# Patient Record
Sex: Female | Born: 1935 | Race: White | Hispanic: No | Smoking: Never smoker
Health system: Southern US, Community
[De-identification: ages and names within clinical notes are randomized; demographics above are authoritative.]

## PROBLEM LIST (undated history)

## (undated) DIAGNOSIS — N390 Urinary tract infection, site not specified: Secondary | ICD-10-CM

## (undated) DIAGNOSIS — Z8719 Personal history of other diseases of the digestive system: Secondary | ICD-10-CM

## (undated) DIAGNOSIS — N183 Chronic kidney disease, stage 3 unspecified: Secondary | ICD-10-CM

## (undated) DIAGNOSIS — D649 Anemia, unspecified: Secondary | ICD-10-CM

## (undated) DIAGNOSIS — Z8701 Personal history of pneumonia (recurrent): Secondary | ICD-10-CM

## (undated) DIAGNOSIS — K219 Gastro-esophageal reflux disease without esophagitis: Secondary | ICD-10-CM

## (undated) DIAGNOSIS — I2510 Atherosclerotic heart disease of native coronary artery without angina pectoris: Secondary | ICD-10-CM

## (undated) DIAGNOSIS — I890 Lymphedema, not elsewhere classified: Secondary | ICD-10-CM

## (undated) DIAGNOSIS — Z8711 Personal history of peptic ulcer disease: Secondary | ICD-10-CM

## (undated) DIAGNOSIS — E039 Hypothyroidism, unspecified: Secondary | ICD-10-CM

## (undated) DIAGNOSIS — E785 Hyperlipidemia, unspecified: Secondary | ICD-10-CM

## (undated) DIAGNOSIS — I10 Essential (primary) hypertension: Secondary | ICD-10-CM

## (undated) DIAGNOSIS — M1990 Unspecified osteoarthritis, unspecified site: Secondary | ICD-10-CM

## (undated) HISTORY — DX: Chronic kidney disease, stage 3 unspecified: N18.30

## (undated) HISTORY — DX: Chronic kidney disease, stage 3 (moderate): N18.3

## (undated) HISTORY — DX: Urinary tract infection, site not specified: N39.0

## (undated) HISTORY — DX: Hyperlipidemia, unspecified: E78.5

## (undated) HISTORY — PX: ABDOMINAL HYSTERECTOMY: SUR658

## (undated) HISTORY — PX: CORONARY ARTERY BYPASS GRAFT: SHX141

## (undated) HISTORY — DX: Essential (primary) hypertension: I10

## (undated) HISTORY — DX: Personal history of pneumonia (recurrent): Z87.01

## (undated) HISTORY — DX: Lymphedema, not elsewhere classified: I89.0

## (undated) HISTORY — DX: Personal history of other diseases of the digestive system: Z87.19

## (undated) HISTORY — DX: Unspecified osteoarthritis, unspecified site: M19.90

## (undated) HISTORY — DX: Atherosclerotic heart disease of native coronary artery without angina pectoris: I25.10

## (undated) HISTORY — DX: Anemia, unspecified: D64.9

## (undated) HISTORY — DX: Hypothyroidism, unspecified: E03.9

## (undated) HISTORY — DX: Gastro-esophageal reflux disease without esophagitis: K21.9

## (undated) HISTORY — DX: Personal history of peptic ulcer disease: Z87.11

---

## 2002-02-07 ENCOUNTER — Encounter: Payer: Self-pay | Admitting: Cardiology

## 2002-02-08 ENCOUNTER — Encounter: Payer: Self-pay | Admitting: Cardiothoracic Surgery

## 2002-02-08 ENCOUNTER — Inpatient Hospital Stay (HOSPITAL_COMMUNITY): Admission: AD | Admit: 2002-02-08 | Discharge: 2002-02-13 | Payer: Self-pay | Admitting: Cardiology

## 2002-02-09 ENCOUNTER — Encounter: Payer: Self-pay | Admitting: Cardiothoracic Surgery

## 2002-02-10 ENCOUNTER — Encounter: Payer: Self-pay | Admitting: Cardiothoracic Surgery

## 2002-02-11 ENCOUNTER — Encounter: Payer: Self-pay | Admitting: Thoracic Surgery (Cardiothoracic Vascular Surgery)

## 2004-02-12 ENCOUNTER — Ambulatory Visit: Payer: Self-pay

## 2004-02-13 ENCOUNTER — Ambulatory Visit: Payer: Self-pay

## 2004-02-14 ENCOUNTER — Ambulatory Visit: Payer: Self-pay

## 2004-02-15 ENCOUNTER — Ambulatory Visit: Payer: Self-pay

## 2004-03-03 ENCOUNTER — Ambulatory Visit: Payer: Self-pay | Admitting: Cardiology

## 2004-03-14 ENCOUNTER — Ambulatory Visit: Payer: Self-pay | Admitting: Cardiovascular Disease

## 2004-04-10 ENCOUNTER — Ambulatory Visit: Payer: Self-pay | Admitting: Internal Medicine

## 2004-05-29 ENCOUNTER — Ambulatory Visit: Payer: Self-pay | Admitting: Cardiology

## 2005-06-16 ENCOUNTER — Ambulatory Visit: Payer: Self-pay | Admitting: Cardiology

## 2005-08-28 ENCOUNTER — Ambulatory Visit: Payer: Self-pay | Admitting: Unknown Physician Specialty

## 2006-07-24 ENCOUNTER — Inpatient Hospital Stay: Payer: Self-pay | Admitting: Internal Medicine

## 2006-07-24 ENCOUNTER — Other Ambulatory Visit: Payer: Self-pay

## 2006-12-15 ENCOUNTER — Ambulatory Visit: Payer: Self-pay | Admitting: Cardiology

## 2007-04-25 ENCOUNTER — Ambulatory Visit: Payer: Self-pay | Admitting: Internal Medicine

## 2007-05-21 ENCOUNTER — Other Ambulatory Visit: Payer: Self-pay

## 2007-05-22 ENCOUNTER — Inpatient Hospital Stay: Payer: Self-pay | Admitting: Specialist

## 2007-12-20 ENCOUNTER — Ambulatory Visit: Payer: Self-pay | Admitting: Cardiology

## 2008-04-18 ENCOUNTER — Ambulatory Visit: Payer: Self-pay | Admitting: Internal Medicine

## 2008-05-09 ENCOUNTER — Telehealth (INDEPENDENT_AMBULATORY_CARE_PROVIDER_SITE_OTHER): Payer: Self-pay | Admitting: *Deleted

## 2008-05-10 ENCOUNTER — Ambulatory Visit: Payer: Self-pay | Admitting: Cardiology

## 2008-05-22 ENCOUNTER — Ambulatory Visit: Payer: Self-pay | Admitting: Cardiology

## 2008-05-23 ENCOUNTER — Ambulatory Visit: Payer: Self-pay | Admitting: Cardiology

## 2008-05-23 ENCOUNTER — Ambulatory Visit: Payer: Self-pay | Admitting: Cardiovascular Disease

## 2008-06-12 ENCOUNTER — Ambulatory Visit: Payer: Self-pay | Admitting: Cardiology

## 2008-07-17 DIAGNOSIS — R0602 Shortness of breath: Secondary | ICD-10-CM

## 2008-07-17 DIAGNOSIS — E785 Hyperlipidemia, unspecified: Secondary | ICD-10-CM | POA: Insufficient documentation

## 2008-07-17 DIAGNOSIS — E039 Hypothyroidism, unspecified: Secondary | ICD-10-CM | POA: Insufficient documentation

## 2008-07-17 DIAGNOSIS — I10 Essential (primary) hypertension: Secondary | ICD-10-CM | POA: Insufficient documentation

## 2008-07-17 DIAGNOSIS — R079 Chest pain, unspecified: Secondary | ICD-10-CM

## 2008-07-17 DIAGNOSIS — K219 Gastro-esophageal reflux disease without esophagitis: Secondary | ICD-10-CM

## 2008-07-25 ENCOUNTER — Telehealth: Payer: Self-pay | Admitting: Cardiology

## 2008-08-30 ENCOUNTER — Telehealth: Payer: Self-pay | Admitting: Cardiology

## 2008-09-03 ENCOUNTER — Emergency Department (HOSPITAL_COMMUNITY): Admission: EM | Admit: 2008-09-03 | Discharge: 2008-09-03 | Payer: Self-pay | Admitting: Emergency Medicine

## 2009-02-22 ENCOUNTER — Ambulatory Visit: Payer: Self-pay | Admitting: Unknown Physician Specialty

## 2009-06-21 ENCOUNTER — Ambulatory Visit: Payer: Self-pay | Admitting: Cardiovascular Disease

## 2009-06-21 DIAGNOSIS — I25810 Atherosclerosis of coronary artery bypass graft(s) without angina pectoris: Secondary | ICD-10-CM

## 2009-11-14 ENCOUNTER — Inpatient Hospital Stay: Payer: Self-pay | Admitting: Internal Medicine

## 2009-12-16 ENCOUNTER — Encounter: Payer: Self-pay | Admitting: Cardiovascular Disease

## 2010-01-07 ENCOUNTER — Ambulatory Visit: Payer: Self-pay | Admitting: Cardiovascular Disease

## 2010-03-03 ENCOUNTER — Telehealth: Payer: Self-pay | Admitting: Cardiovascular Disease

## 2010-03-11 NOTE — Assessment & Plan Note (Signed)
Summary: Z6X   Primary Provider:  Dr. [name]  CC:  ROV; No complaints.  History of Present Illness: very pleasant 77 year old woman with a history of bypass surgery in 2003, stress test April 2010 showing inferior wall ischemia with cardiac catheterization at that time showing occlusion of her native RCA and occlusion of her vein graft to the PDA with collateralized vessels from left to right, mild carotid arterial disease, moderate to severe MR by echocardiogram at her normal clinic in March 2010 who presents for routine followup.  Overall she states that she is doing very well. She denies any significant shortness of breath, chest pain, lower extremity edema. She drinks plenty of water, mows the yard and is very active each day. She has no complaints.  Problems Prior to Update: 1)  Chest Pain-unspecified  (ICD-786.50) 2)  Dyspnea  (ICD-786.05) 3)  Hypertension, Unspecified  (ICD-401.9) 4)  Hyperlipidemia-mixed  (ICD-272.4) 5)  Hypothyroidism  (ICD-244.9) 6)  Gerd  (ICD-530.81)  Medications Prior to Update: 1)  Nitroglycerin 0.4 Mg Subl (Nitroglycerin) .... One Tablet Under Tongue Every 5 Minutes As Needed For Chest Pain---May Repeat Times Three 2)  Furosemide 40 Mg Tabs (Furosemide) .... Take One Tablet By Mouth Daily.  Current Medications (verified): 1)  Nitroglycerin 0.4 Mg Subl (Nitroglycerin) .... One Tablet Under Tongue Every 5 Minutes As Needed For Chest Pain---May Repeat Times Three 2)  Levothroid 75 Mcg Tabs (Levothyroxine Sodium) .... Take 1 By Mouth Once Daily 3)  Simvastatin 40 Mg Tabs (Simvastatin) .... Take One Tablet By Mouth Daily At Bedtime 4)  Premarin 25 Mg Solr (Estrogens Conjugated) .... Take 1 By Mouth Every 5 Days 5)  Losartan Potassium 100 Mg Tabs (Losartan Potassium) .... Take 1/2 Tablet By Mouth Once Daily  (Not Started Yet) 6)  Benicar 40 Mg Tabs (Olmesartan Medoxomil) .... Take One Half  Tablet By Mouth Daily 7)  Aspirin 81 Mg Tbec (Aspirin) .... Take One  Tablet By Mouth Daily 8)  Oscal 500/200 D-3 500-200 Mg-Unit Tabs (Calcium-Vitamin D) .... Take 1 By Mouth Once Daily 9)  Multivitamins  Tabs (Multiple Vitamin) .... Take 1p O Once Daily  Allergies (verified): 1)  ! Iodine  Past History:  Past Medical History: Last updated: 07/17/2008 CHEST PAIN-UNSPECIFIED (ICD-786.50) DYSPNEA (ICD-786.05) HYPERTENSION, UNSPECIFIED (ICD-401.9) HYPERLIPIDEMIA-MIXED (ICD-272.4) HYPOTHYROIDISM (ICD-244.9) GERD (ICD-530.81)    Past Surgical History: Last updated: 07/17/2008 Cholecystectomy Hysterectomy CABG  Family History: Last updated: 07/17/2008 CHF Family History of Cancer:  Family History of Diabetes:   Social History: Last updated: 07/17/2008 Married  Tobacco Use - No.  Alcohol Use - no Drug Use - no  Risk Factors: Smoking Status: never (07/17/2008)  Review of Systems  The patient denies fever, weight loss, weight gain, vision loss, decreased hearing, hoarseness, chest pain, syncope, dyspnea on exertion, peripheral edema, prolonged cough, abdominal pain, incontinence, muscle weakness, depression, and enlarged lymph nodes.    Vital Signs:  Patient profile:   77 year old female Height:      63 inches Weight:      131 pounds BMI:     23.29 Pulse rate:   60 / minute BP sitting:   126 / 62  (left arm) Cuff size:   regular  Vitals Entered By: Stanton Kidney, EMT-P (Jun 21, 2009 10:52 AM)  Physical Exam  General:  well-appearing elderly woman in no apparent distress, HEENT exam is benign, oropharynx is clear, neck is supple with no JVP or carotid bruits, heart sounds are regular with S1-S2 with a 2/6  systolic ejection murmur heard at the apical region extending into the axillary area, lungs are clear to auscultation with no wheezes Rales, abdominal exam is benign, no significant lower extremity edema, neurologic exam is grossly nonfocal, skin is warm and dry. Pulses are equal and symmetrical in her upper and lower  extremities.    EKG  Procedure date:  06/21/2009  Findings:      normal sinus rhythm with a rate of 60 beats per minute, no significant ST or T wave changes.  Impression & Recommendations:  Problem # 1:  CAD, ARTERY BYPASS GRAFT (ICD-414.04) history of coronary artery disease with bypass in 03, occluded native RCA and vein graft to the RCA with collateral filling from left to right. She has no symptoms.  We will continue her on her current medications.  Her updated medication list for this problem includes:    Nitroglycerin 0.4 Mg Subl (Nitroglycerin) ..... One tablet under tongue every 5 minutes as needed for chest pain---may repeat times three    Aspirin 81 Mg Tbec (Aspirin) .Marland Kitchen... Take one tablet by mouth daily  Problem # 2:  HYPERLIPIDEMIA-MIXED (ICD-272.4) Try to obtain a copy of her most recent cholesterol panel from Dr. Aletha Halim when she sees him next week.  Her updated medication list for this problem includes:    Simvastatin 40 Mg Tabs (Simvastatin) .Marland Kitchen... Take one tablet by mouth daily at bedtime  Problem # 3:  HYPERTENSION, UNSPECIFIED (ICD-401.9) Blood pressure is well controlled on her current medication. She do to run out of Benicar . She is concerned about taking losartan 100 mg at a time and I suggested that she talk with Dr. [name] and that she could possibly cut it in half to start.  The following medications were removed from the medication list:    Furosemide 40 Mg Tabs (Furosemide) .Marland Kitchen... Take one tablet by mouth daily. Her updated medication list for this problem includes:    Losartan Potassium 100 Mg Tabs (Losartan potassium) .Marland Kitchen... Take 1/2 tablet by mouth once daily  (not started yet)    Benicar 40 Mg Tabs (Olmesartan medoxomil) .Marland Kitchen... Take one half  tablet by mouth daily    Aspirin 81 Mg Tbec (Aspirin) .Marland Kitchen... Take one tablet by mouth daily

## 2010-03-13 NOTE — Progress Notes (Signed)
Summary: Samples  Phone Note Call from Patient Call back at Home Phone [phone]   Caller: Self Call For: Gollan Summary of Call: Pt was given samples of Zetia and would like more to last until March 16. Initial call taken by: Harlon Flor,  March 03, 2010 9:35 AM  Follow-up for Phone Call        Patient will stop simvastatin and zetia and a Rx will be called [facility] [address] for generic lipitor 40 mg with titration up to 80 mg one tablet at bedtime.  Rx called to [facility] [address].   Follow-up by: Bishop Dublin, CMA,  March 03, 2010 9:50 AM    Prescriptions: LIPITOR 80 MG TABS (ATORVASTATIN CALCIUM) Take 1/2 tablet by mouth once daily for one month. Then take 1 tab by mouth once daily.  #30 x 6   Entered by:   Bishop Dublin, CMA   Authorized by:   Dossie Arbour MD   Signed by:   Bishop Dublin, CMA on 03/03/2010   Method used:   [facility]        [facility] [employer] [phone][facility] (retail)       [address] [place], [place]  [address]       Ph: [phone] or [phone]       Fax: [phone]   RxID:   [id number]

## 2010-03-13 NOTE — Assessment & Plan Note (Signed)
Summary: F6M/AMD   Visit Type:  Follow-up Primary Raeden Schippers:  Dr. [name]  CC:  Denies chest pain or shortness of breath..  History of Present Illness: 77 year old woman with a history of bypass surgery in 2003, stress test April 2010 showing inferior wall ischemia with cardiac catheterization at that time showing occlusion of her native RCA and occlusion of her vein graft to the PDA with collateralized vessels from left to right, mild carotid arterial disease, moderate to severe MR by echocardiogram at outside clinic in March 2010 who presents for routine followup.  She states that she was recently in the hospital for diverticular disease for 5 days. She had significant GI bleeding. She has been recovering well and slowly getting her strength back. She is otherwise very active, has no complaints of chest pain or shortness of breath. No lightheadedness or dizziness. Her energy level is much better.  EKG shows normal sinus rhythm with a rate of 55 beats per minute, no significant ST or T wave changes  Current Medications (verified): 1)  Nitroglycerin 0.4 Mg Subl (Nitroglycerin) .... One Tablet Under Tongue Every 5 Minutes As Needed For Chest Pain---May Repeat Times Three 2)  Levothroid 75 Mcg Tabs (Levothyroxine Sodium) .... Take 1 By Mouth Once Daily 3)  Simvastatin 40 Mg Tabs (Simvastatin) .... Take One Tablet By Mouth Daily At Bedtime 4)  Premarin 0.625 Mg Tabs (Estrogens Conjugated) .... One Tablet Once Daily 5)  Losartan Potassium 100 Mg Tabs (Losartan Potassium) .... Take 1/2 Tablet By Mouth Once Daily 6)  Benicar 40 Mg Tabs (Olmesartan Medoxomil) .... Take One Half  Tablet By Mouth Daily 7)  Aspirin 81 Mg Tbec (Aspirin) .... Take One Tablet By Mouth Daily 8)  Oscal 500/200 D-3 500-200 Mg-Unit Tabs (Calcium-Vitamin D) .... Take 1 By Mouth Once Daily 9)  Multivitamins  Tabs (Multiple Vitamin) .... Take 1p O Once Daily 10)  Metoprolol Succinate 100 Mg Xr24h-Tab (Metoprolol Succinate) ....  One Tablet Once Daily  Allergies (verified): 1)  ! Iodine  Past History:  Past Medical History: Last updated: 07/17/2008 CHEST PAIN-UNSPECIFIED (ICD-786.50) DYSPNEA (ICD-786.05) HYPERTENSION, UNSPECIFIED (ICD-401.9) HYPERLIPIDEMIA-MIXED (ICD-272.4) HYPOTHYROIDISM (ICD-244.9) GERD (ICD-530.81)    Past Surgical History: Last updated: 07/17/2008 Cholecystectomy Hysterectomy CABG  Family History: Last updated: 07/17/2008 CHF Family History of Cancer:  Family History of Diabetes:   Social History: Last updated: 07/17/2008 Married  Tobacco Use - No.  Alcohol Use - no Drug Use - no  Risk Factors: Smoking Status: never (07/17/2008)  Review of Systems  The patient denies fever, weight loss, weight gain, vision loss, decreased hearing, hoarseness, chest pain, syncope, dyspnea on exertion, peripheral edema, prolonged cough, abdominal pain, incontinence, muscle weakness, depression, and enlarged lymph nodes.    Vital Signs:  Patient profile:   77 year old female Height:      63 inches Weight:      124.75 pounds BMI:     22.18 Pulse rate:   55 / minute BP sitting:   114 / 62  (left arm) Cuff size:   regular  Vitals Entered By: Bishop Dublin, CMA (January 07, 2010 10:28 AM)  Physical Exam  General:  Well developed, well nourished, in no acute distress. Head:  normocephalic and atraumatic Neck:  Neck supple, no JVD. No masses, thyromegaly or abnormal cervical nodes. Chest Wall:  no deformities or breast masses noted Lungs:  Clear bilaterally to auscultation and percussion. Heart:  Non-displaced PMI, chest non-tender; regular rate and rhythm, S1, S2 without murmurs, rubs or gallops. Carotid  upstroke normal, no bruit.  Pedals normal pulses. No edema, no varicosities. Abdomen:  Bowel sounds positive; abdomen soft and non-tender without masses Msk:  Back normal, normal gait. Muscle strength and tone normal. Pulses:  pulses normal in all 4 extremities Extremities:  No  clubbing or cyanosis. Neurologic:  Alert and oriented x 3. Skin:  Intact without lesions or rashes. Psych:  Normal affect.   Impression & Recommendations:  Problem # 1:  CAD, ARTERY BYPASS GRAFT (ICD-414.04) no symptoms of angina at this time. No further testing ordered.  Her updated medication list for this problem includes:    Nitroglycerin 0.4 Mg Subl (Nitroglycerin) ..... One tablet under tongue every 5 minutes as needed for chest pain---may repeat times three    Aspirin 81 Mg Tbec (Aspirin) .Marland Kitchen... Take one tablet by mouth daily    Metoprolol Succinate 100 Mg Xr24h-tab (Metoprolol succinate) ..... One tablet once daily  Problem # 2:  HYPERTENSION, UNSPECIFIED (ICD-401.9) Blood pressure is well controlled on her current medication regimen. No changes made.  The following medications were removed from the medication list:    Benicar 40 Mg Tabs (Olmesartan medoxomil) .Marland Kitchen... Take one half  tablet by mouth daily Her updated medication list for this problem includes:    Losartan Potassium 100 Mg Tabs (Losartan potassium) .Marland Kitchen... Take 1/2 tablet by mouth once daily    Aspirin 81 Mg Tbec (Aspirin) .Marland Kitchen... Take one tablet by mouth daily    Metoprolol Succinate 100 Mg Xr24h-tab (Metoprolol succinate) ..... One tablet once daily  Problem # 3:  HYPERLIPIDEMIA-MIXED (ICD-272.4) we will try to obtain her most recent lipid panel from Dr. [name]. Goal LDL is less than 70  Her updated medication list for this problem includes:    Simvastatin 40 Mg Tabs (Simvastatin) .Marland Kitchen... Take one tablet by mouth daily at bedtime  Patient Instructions: 1)  Your physician recommends that you schedule a follow-up appointment in: 6 months 2)  Your physician recommends that you continue on your current medications as directed. Please refer to the Current Medication list given to you today.  Appended Document: F6M/AMD Labs from November 2011 shows total cholesterol 188, LDL 92, HDL 71, normal LFTs. Suggest we change  her from simvastatin to Lipitor 40 mg daily for one month with titration up to 80 mg daily with lipid panel and LFTs in 3 months time.  Appended Document: F6M/AMD Pt notified of lab results, pt aware she is to stop simvastatin and start lipitor 40mg  1 month and then 80mg  once daily. Rx called in to [facility] [address], and scheduled for lipid/lfts 3 months.  Appended Document: F6M/AMD    Clinical Lists Changes  Medications: Changed medication from SIMVASTATIN 40 MG TABS (SIMVASTATIN) Take one tablet by mouth daily at bedtime to LIPITOR 80 MG TABS (ATORVASTATIN CALCIUM) Take 1/2 tablet by mouth once daily for one month. Then take 1 tab by mouth once daily. - Signed Rx of LIPITOR 80 MG TABS (ATORVASTATIN CALCIUM) Take 1/2 tablet by mouth once daily for one month. Then take 1 tab by mouth once daily.;  #30 x 6;  Signed;  Entered by: Lanny Hurst RN;  Authorized by: Dossie Arbour MD;  Method used: Electronically to [facility] [address], [place], [place], [place]  [address], Ph: [phone], Fax: [phone]    Prescriptions: LIPITOR 80 MG TABS (ATORVASTATIN CALCIUM) Take 1/2 tablet by mouth once daily for one month. Then take 1 tab by mouth once daily.  #30 x  6   Entered by:   Lanny Hurst RN   Authorized by:   Dossie Arbour MD   Signed by:   Lanny Hurst RN on 01/21/2010   Method used:   Electronically to        [facility]  [address] (retail)       [address], [address]       [place], [place]  [address]       Ph: [phone]       Fax: [phone]   RxID:   [id number]    Appended Document: F6M/AMD pt states she had just had zocor 3 months supply filled that came in the mail and she cannot return it. She would like to know if she can continue this for 3 months and then change to lipitor. Pt is still scheduled for 3 month lipid/lft.  Appended Document: F6M/AMD use the simvastatin, but we will add zetia for three  months. Stop zetia when changing to lipitor  We can wait to start zetia until we get samples (the take 10 mg and cut in 1/2 and take everyday with simva)  Appended Document: F6M/AMD Attempted to call pt LMOM TCB /MES  Appended Document: F6M/AMD pt made aware of above recommendation, will call pt when recieve zetia samples, pt will continue simvastatin. /MES  Appended Document: F6M/AMD Notified patient samples available for zetia 10 mg x 3 week supply.

## 2010-04-22 ENCOUNTER — Other Ambulatory Visit (INDEPENDENT_AMBULATORY_CARE_PROVIDER_SITE_OTHER): Payer: Medicare PPO

## 2010-04-22 ENCOUNTER — Encounter: Payer: Self-pay | Admitting: Cardiovascular Disease

## 2010-04-22 DIAGNOSIS — E785 Hyperlipidemia, unspecified: Secondary | ICD-10-CM

## 2010-04-24 LAB — CONVERTED CEMR LAB
ALT: 18 units/L (ref 0–35)
Alkaline Phosphatase: 40 units/L (ref 39–117)
Cholesterol: 194 mg/dL (ref 0–200)
Indirect Bilirubin: 0.5 mg/dL (ref 0.0–0.9)
Total Protein: 6 g/dL (ref 6.0–8.3)
Triglycerides: 126 mg/dL (ref <150)
VLDL: 25 mg/dL (ref 0–40)

## 2010-05-18 LAB — URINALYSIS, ROUTINE W REFLEX MICROSCOPIC
Bilirubin Urine: NEGATIVE
Ketones, ur: NEGATIVE mg/dL
Nitrite: NEGATIVE
Protein, ur: NEGATIVE mg/dL
Urobilinogen, UA: 0.2 mg/dL (ref 0.0–1.0)

## 2010-05-18 LAB — DIFFERENTIAL
Eosinophils Relative: 1 % (ref 0–5)
Lymphocytes Relative: 24 % (ref 12–46)
Monocytes Absolute: 0.7 10*3/uL (ref 0.1–1.0)
Monocytes Relative: 10 % (ref 3–12)
Neutro Abs: 4.5 10*3/uL (ref 1.7–7.7)

## 2010-05-18 LAB — CBC
MCHC: 34.6 g/dL (ref 30.0–36.0)
MCV: 95.5 fL (ref 78.0–100.0)
Platelets: 164 10*3/uL (ref 150–400)

## 2010-05-18 LAB — COMPREHENSIVE METABOLIC PANEL
AST: 27 U/L (ref 0–37)
Albumin: 3.2 g/dL — ABNORMAL LOW (ref 3.5–5.2)
Alkaline Phosphatase: 30 U/L — ABNORMAL LOW (ref 39–117)
BUN: 19 mg/dL (ref 6–23)
Chloride: 102 mEq/L (ref 96–112)
GFR calc Af Amer: 50 mL/min — ABNORMAL LOW (ref >60)
Potassium: 4.4 mEq/L (ref 3.5–5.1)
Total Protein: 5.7 g/dL — ABNORMAL LOW (ref 6.0–8.3)

## 2010-05-18 LAB — TSH: TSH: 0.947 u[IU]/mL (ref 0.350–4.500)

## 2010-05-18 LAB — POCT CARDIAC MARKERS
CKMB, poc: 1.2 ng/mL (ref 1.0–8.0)
Myoglobin, poc: 116 ng/mL (ref 12–200)

## 2010-05-18 LAB — T4, FREE: Free T4: 1.31 ng/dL (ref 0.80–1.80)

## 2010-06-24 NOTE — Assessment & Plan Note (Signed)
[facility] OFFICE NOTE   DAYANIRA, GIOVANNETTI                     MRN:          [id number]  DATE:12/15/2006                            DOB:          November 09, 1922    Ms. Stones comes today to establish with me as her cardiologist.  She  has been a former patient of Dr. Annette Stable Downey's who has moved to  [place].   She is 89-years of age and she literally works non-stop as she puts it.  She thoroughly enjoys her yard, her flowers and her garden. She will  work all day sometimes without stopping.   She has had coronary artery bypass surgery in 2003.  She had a negative  stress Myoview with normal left ventricular function in January of 2006.  She is having absolutely no symptoms of angina or dyspnea on exertion.  She presented with these symptoms prior to her diagnosis of coronary  disease.   She has hypertension and hyperlipidemia which is being managed by Dr.  [name]. She has been with him for 16 years apparently.   CURRENT MEDICATIONS:  1. Avapro 150 mg daily.  2. Premarin 0.625 mg daily.  3. Aspirin 81 mg daily.  4. Os-Cal and vitamins daily.  5. Toprol XL 50 mg daily.  6. Folic acid 1 mg daily.  7. Zocor 40 mg daily.  8. Prilosec 20 mg daily.  9. Levoxyl 100 mcg daily.   VITAL SIGNS:  Her blood pressure today is 112/62, her pulse is 68 and  regular.  EKG is normal.  Her weight is 131.  HEENT:  She looks extremely younger than her stated age. PERLA.  Extraocular movements are intact. Sclerae clear. Facial symmetry is  normal.  NECK:  Carotids are full without bruits, there is no JVD. Thyroid is not  enlarged. Trachea is midline.  MUSCULOSKELETAL:  Sternotomy site is stable.  There is a slight amount  of point tenderness just to the left parasternal border at the level of  the breast.  LUNGS:  Clear.  HEART:  Reveals a non displaced PMI, normal S1, S2.  ABDOMEN:  Soft with good bowel sounds.  EXTREMITIES:  No edema. Pulses are intact.  NEURO EXAM:  Intact.   ASSESSMENT AND PLAN:  Ms. Brogden is doing remarkably well.  I have not  made any changes in her medical program.  Will see her back in a year.  At that time will probably perform  a stress Myoview.  At this point she is extremely active and having no  symptoms at all, much less symptoms consistent with how she presented.     Thomas C. Daleen Squibb, MD, [facility]  Electronically Signed    TCW/MedQ  DD: 12/15/2006  DT: 12/15/2006  Job #: [id number]   cc:   Aram Beecham, M.D.

## 2010-06-24 NOTE — Assessment & Plan Note (Signed)
[facility] OFFICE NOTE   NETA, UPADHYAY                     MRN:          [id number]  DATE:05/22/2008                            DOB:          [birth year]    CHIEF COMPLAINT:  Chest tightness and pressure as well as shortness of  breath with exertion.  I do not feel well.   HISTORY OF PRESENT ILLNESS:  Ms. Radley Teston is an 77 year old much  younger looking than stated age white female who has known coronary  artery disease.  She has had coronary bypass grafting in 2003, by Dr. Ofilia Neas.  At that time she had a left internal mammary graft to the  LAD, sequential vein graft to a proximal PDA and distal PDA and a vein  graft to the circumflex.  She had preoperatively good LV function.   She presented with an abnormal Cardiolite and progressive angina.   Because of complaint, we performed a Lexiscan thallium on May 10, 2008,  at [facility].  Her EF was 55% and she had a fully  reversible small mid-to-apical inferior perfusion defect.  Compromise  about 5-10% of LV myocardium.  I am concerned about the sequential vein  graft to the PDA.   PAST MEDICAL HISTORY:  She had a DYE ALLERGY years ago.  She was  prophylaxed last time before cath and had no contrast reaction.  She  also has a rash to tetanus shot in the past.   CURRENT MEDICATIONS:  1. Aspirin 81 mg a day.  2. Toprol-XL 100 mg a day.  3. Folic acid 1 mg per day.  4. Zocor 40 mg a day.  5. Prilosec 20 mg per day.  6. Os-Cal.  7. Vitamin 2 per day.  8. Synthroid 50 mcg per day.  9. Premarin 0.625 daily.  10.Multivitamin daily.  11.Benicar 20 mg per day.   PAST MEDICAL HISTORY:  She has a history of hypertension,  hyperlipidemia, hypothyroidism, gastroesophageal reflux.   She has history of mild mitral regurgitation in the past and on her  recent echo, she had moderate-to-severe mitral regurgitation.  This was  performed at [facility], April 17, 2008.   She has nonobstructive carotid disease.   She had a hysterectomy as well as coronary bypass grafting.   SOCIAL HISTORY:  She does not smoke or drink.  She is married.  She has  one child.  She enjoys exercising and working in her yard.   FAMILY HISTORY:  Negative or nonapical at this point in time.   REVIEW OF SYSTEMS:  Other than HPI is negative other than some reflux  symptoms on occasion.  She denies any orthopnea, PND, peripheral edema.   PHYSICAL EXAMINATION:  VITAL SIGNS:  Her blood pressure is 130/80, her  pulse is 61 and regular, her weight is 140, up 7.  HEENT:  Normocephalic, atraumatic.  PERRLA, extraocular movements  intact.  Sclerae are clear.  Facial symmetry is normal.  Dentition  satisfactory.  NECK:  Supple.  Carotid upstrokes were equal bilaterally without bruits.  Thyroid is not enlarged.  Trachea is midline.  CHEST:  Lungs are clear to auscultation and percussion.  Slight  decreased breath sounds in left base.  HEART:  Systolic murmur at the apex.  No gallop.  ABDOMEN:  Soft, good bowel sounds.  No midline bruit.  No hepatomegaly.  EXTREMITIES:  There is no cyanosis, clubbing, or edema.  Pulses are  intact.  NEURO:  Intact.  MUSCULOSKELETAL:  Chronic arthritic changes.   Her EKG is normal.   ASSESSMENT/PLAN:  Ms. Oquin was having increased shortness of breath  and angina.  She has a reversible defect in the inferior apex on a  thallium and also has now moderate-to-severe mitral regurgitation on a 2-  D echocardiogram occur at [facility].   PLAN:  1. Sublingual nitroglycerin p.r.n. with strict instructions on how to      use and how to contact 911.  2. Begin furosemide 40 mg p.o. daily.  3. Cardiac catheterization without prophylaxis written at [facility].  We will arrange this as soon as possible.     Thomas C. Daleen Squibb, MD, [facility]  Electronically Signed    TCW/MedQ  DD:  05/22/2008  DT: 05/23/2008  Job #: [id number]   cc:   Aram Beecham, MD

## 2010-06-24 NOTE — Assessment & Plan Note (Signed)
[facility] OFFICE NOTE   MACKINZE, CRIADO                     MRN:          [id number]  DATE:06/12/2008                            DOB:          05-Jun-1922    HISTORY OF PRESENT ILLNESS:  Ms. Zachow returns today for her dyspnea  and positive stress Myoview showing inferior apical ischemia.   She had a cardiac cath at [facility] by Dr. Tonny Bollman.  It  showed patent LIMA to the LAD, patent vein graft to an obtuse marginal,  and occluded vein graft to the PDA.  She has severe underlying native  three-vessel disease with totally occluded RCA with collateralize vessel  to the distal right and PDA.  She had normal left ventricular systolic  function, only mild mitral regurgitation.   She continues to have dyspnea sometimes all day long.  She has not tried  sublingual nitroglycerin.   She has no history of tobacco use and has no known history of COPD.   MEDICATIONS:  Her meds are outlined in the chart, including sublingual  nitroglycerin.  Please refer to my previous dictation and also the  maintenance medication list.   PHYSICAL EXAMINATION:  Blood pressure is 124/70, pulse 64 and regular.  She is in no acute distress.  The rest of her exam is unchanged.   DISCUSSION:  I had a long talk with Ms. Lorio and her daughter.  I have  told to try sublingual nitroglycerin for significant dyspnea to see if  it helps.  However, I suspect that this dyspnea that occurs off and on  all day or last all day is not her heart.  I explained in detail using a  heart model and showing exactly where her collaterals supplyed her  distal right coronary artery.  She is on excellent medications and also  has a resting heart rate and that is controlled at 60 and a well-  controlled blood pressure.   I have suggested she consider a pulmonary consultation after talking  with her primary care physician Dr. Aram Beecham.   We will see her back on annual basis.  At this point, I do not have any  other suggestions.     Thomas C. Daleen Squibb, MD, [facility]  Electronically Signed    TCW/MedQ  DD: 06/12/2008  DT: 06/13/2008  Job #: [id number]   cc:   Aram Beecham, MD

## 2010-06-24 NOTE — Assessment & Plan Note (Signed)
[facility] OFFICE NOTE   [name], [name]                     MRN:          [id number]  DATE:12/20/2007                            DOB:          12-Dec-1922    [name] returns today for followup.  She is doing remarkably well and  continues to be extremely active working in her yard, etc.   She offers no complaints including no angina or anginal equivalents.   For her problem list, please refer to previous notes.  She has had  coronary artery disease and coronary bypass grafting in 2003.  She has  normal left ventricular systolic function.  She has hypertension and  hyperlipidemia followed by Dr. [name].   Her current medications are:  1. Avapro 150 mg a day.  2. Aspirin 81 mg a day.  3. Toprol-XL 100 mg a day.  4. Folic acid 1 mg a day.  5. Zocor 40 mg a day.  6. Prilosec 20 mg a day.  7. Os-Cal.  8. Synthroid 0.5 mg per day.  9. She rarely takes furosemide.   PHYSICAL EXAMINATION:  VITAL SIGNS:  Her blood pressure is usually  normal at home and outside the office.  Her blood pressure today is  148/78, pulse is 56 and regular.  Her weight is 133.  HEENT:  Essentially normal.  NECK:  Carotids are full without bruits.  There is no thyromegaly.  Trachea is midline.  There is no JVD.  LUNGS:  Clear to auscultation and percussion.  HEART:  A nondisplaced PMI.  Normal S1 and S2.  No gallop.  ABDOMEN:  Soft.  Good bowel sounds.  No midline bruit.  No hepatomegaly.  EXTREMITIES:  There are no cyanosis, clubbing, or edema.  Pulses are  intact.  NEUROLOGIC:  Intact.   EKG shows sinus bradycardia with low voltage and no changes.   ASSESSMENT AND PLAN:  [name] is doing well.  She is due a  surveillance Myoview.  We will arrange for her to have this done at  [facility] after the New Year per her  request.  She has a lot of stress going on right now with her daughter  having a recent  traumatic injury.  I will see her back in a year,  otherwise.     Thomas C. Daleen Squibb, MD, [facility]  Electronically Signed    TCW/MedQ  DD: 12/20/2007  DT: 12/20/2007  Job #: [id number]   cc:   Barkley Bruns. [name], MD

## 2010-06-27 NOTE — Cardiovascular Report (Signed)
NAMEDEJUANA, WEIST NO.:  [id number]   MEDICAL RECORD NO.:  [id number]                   PATIENT TYPE:  OIB   LOCATION:  2899                                 FACILITY:  [facility]   PHYSICIAN:  Salvadore Farber, M.D. [facility]         DATE OF BIRTH:  [birth year]   DATE OF PROCEDURE:  02/07/2002  DATE OF DISCHARGE:                              CARDIAC CATHETERIZATION   PROCEDURES:  Left heart catheterization, left ventriculography, abdominal  aortography, coronary angiography, left subclavian angiography (selective).   INDICATIONS:  The patient is a 77 year old lady with progressive exertional  angina and exercise test indicative of inferior ischemia.  She is referred  for diagnostic angiography.   DIAGNOSTIC TECHNIQUE:  Informed consent was obtained. Under 1% lidocaine  local anesthesia, a 6 French sheath was placed in the right femoral artery  using the modified Seldinger technique. Diagnostic angiography was performed  using JL4 and JR4 catheters.  Ventriculography was performed in the RAO  projection using a 6 French pigtail catheter. The pigtail catheter was then  pulled back in the abdominal aorta and abdominal aortography performed.  The  JR4 catheter was used to selectively engage the left subclavian artery and  subclavian angiography performed by hand injection.  The patient tolerated  the procedure well and was transferred to the holding room in stable  condition.  The sheaths are to be removed there.   COMPLICATIONS:  None.   FINDINGS:  1. LV:  EF 65% without regional wall motion abnormality.  2. Mitral regurgitation 1+, no aortic stenosis.  3. Normal left subclavian and proximal left internal mammary artery.  4. Mild right renal artery stenosis, normal single renal artery on the left.  5. Left main:  Angiographically normal.  6. LAD:  The LAD is a moderate sized vessel giving rise to two small     diagonal branches. The proximal vessel has a  calcified 90% stenosis.  7. Circumflex:  The circumflex is a moderate sized vessel giving rise to a     small OM-1 and large OM-2.  OM-1 has an 80% ostial stenosis.  The mid     circumflex extending into OM-2 has a long, 80% stenosis.  8. RCA:  The RCA has a 50% ostial stenosis. It is occluded in the mid     section after the takeoff of the first acute marginal branch. The distal     RCA is supplied via bridging collaterals and left to right collaterals.     There is a 90% stenosis in the midportion of the PDA.   IMPRESSION:  The patient has severe three-vessel coronary artery disease  with preserved left ventricular systolic function.    RECOMMENDATIONS:  I have recommended coronary artery bypass grafting.  This  will be arranged during this hospitalization.  Salvadore Farber, M.D. [facility]    WED/MEDQ  D:  02/07/2002  T:  02/07/2002  Job:  [id number]   cc:   Duke Salvia, M.D. [facility]   Tinnie Gens D. [name], M.D.  [facility]

## 2010-06-27 NOTE — Discharge Summary (Signed)
[name], [name] NO.:  [id number]   MEDICAL RECORD NO.:  [id number]                   PATIENT TYPE:  INP   LOCATION:  2024                                 FACILITY:  [facility]   PHYSICIAN:  Erin Daily. Tyrone Rasmussen, M.D.            DATE OF BIRTH:  08/01/1922   DATE OF ADMISSION:  02/07/2002  DATE OF DISCHARGE:  02/13/2002                                 DISCHARGE SUMMARY   CARDIOLOGIST:  Dr. Salvadore Farber.   PRIMARY CARE:  Dr. [name].   FINAL DIAGNOSES:  1. Severe three-vessel coronary artery disease with preserved left     ventricular systolic function, ejection fraction 65%.  2. Abnormal stress Cardiolite.  3. Recent progressive angina.  4. Hypertension.  5. Dyslipidemia.  6. Hypothyroidism.  7. Gastroesophageal reflux disease.  8. Mild postoperative anemia.   PROCEDURES:  1. Cardiac catheterization on February 07, 2002.  2. Coronary artery bypass graft x4 on February 08, 2002 with endoscopic vein     harvest; the following grafts were done:  Left internal mammary artery to     left anterior descending, sequential saphenous vein graft from proximal     posterior descending artery to distal posterior descending artery,     saphenous vein graft to circumflex.   BRIEF HISTORY/HOSPITAL COURSE:  The patient was a 77 year old female with no  previous cardiac history, but with risk factors including hyperlipidemia and  hyperlipidemia.  She had a five- to six-month history of shortness of breath  and chest tightness prior to admission.  Dr. [name] in [place] arranged a  Cardiolite stress test, which was abnormal; this led to admission and  cardiac catheterization by Dr. Samule Ohm showing severe three-vessel coronary  artery disease and preserved left ventricular function.  CVTS was consulted.  Dr. Gwenith Daily. Gerhardt recommended CABG as best treatment.  Risks, benefits,  details and alternatives were discussed and it was agreed to proceed.  She  underwent the procedure on February 08, 2002.  There were no complications.  She was taken to SICU in stable condition.  Postop, there were no major  complications.  She was transferred to unit 2000 on February 10, 2002.  She  did have some anemia, but this did not affect her clinical progress.  She  was started on iron and folate.  On postop day 5, February 13, 2002, she was  feeling well, she was afebrile, vital signs were stable, she was in sinus  rhythm, she had diuresed well, physical exam was satisfactory, her anemia  had been stable.  She was discharged home in stable condition.   MEDICATIONS AT TIME OF DISCHARGE:  1. Avapro 150 mg daily.  2. Synthroid 88 mcg daily.  3. Enteric-coated aspirin 325 mg daily.  4. Toprol-XL 25 mg daily.  5. Prevacid 30 mg daily.  6. Zocor 20 mg daily.  7. Niferex-150 one daily.  8. Folic acid 1 mg one daily.  9. Colace 100 mg two tablets daily.  10.      She was told to resume her home Premarin.  11.      She was told to resume her home multivitamin.  12.      Percocet one to two every four to six hours p.r.n. for pain.   SPECIAL INSTRUCTIONS:  She was told to avoid driving, heavy lifting,  strenuous activity, working.  She was told that she could take a shower and  to clean her wounds gently daily with soap and water and to call the office  if she had any wound problems.  She was told to get a chest x-ray when she  saw the cardiologist in two weeks and to bring it with her to see Dr.  Tyrone Rasmussen.   CONDITION:  Stable.   DISPOSITION:  None.   FOLLOWUP:  1. Dr. Samule Ohm on March 01, 2002 at 9:15 a.m.  2. [name] three weeks after discharge, office would call with     appointment.      [name], P.A.                          [name], M.D.    Alwyn Ren  D:  04/19/2002  T:  04/20/2002  Job:  [id number]   cc:   Dr. [name]   Dr. Salvadore Farber, [facility]

## 2010-06-27 NOTE — Consult Note (Signed)
[name], [name]                        ACCOUNT NO.:  [id number]   MEDICAL RECORD NO.:  [id number]                   PATIENT TYPE:  OIB   LOCATION:  3739                                 FACILITY:  [facility]   PHYSICIAN:  Gwenith Daily. Tyrone Sage, M.D.            DATE OF BIRTH:  24-Oct-1922   DATE OF CONSULTATION:  02/07/2002  DATE OF DISCHARGE:                                   CONSULTATION   FOLLOWUP CARDIOLOGIST:  Dr. Duke Salvia.   PRIMARY CARE PHYSICIAN:  Dr. [name] in [place].   REASON FOR CONSULTATION:  Coronary artery disease.   HISTORY OF PRESENT ILLNESS:  The patient is a 77 year old female with no  previous cardiac history, who presents with a five- to six-month history of  shortness of breath and chest tightness with exertion.  The pain is non-  radiating, no diaphoresis, usually starts during the first 15 minutes of  walking and if she continues to walk, it gradually subsides, or rest will  stop it in several minutes.  Over the past several months, the symptoms have  been getting worse.  She described this to Dr. [name] in [place], who  arranged for a Cardiolite stress test.  The patient had poor exercise  tolerance, only exercising 2 minutes and 30 seconds, with evidence of  inferior ischemia; for this reason, she was referred to Dr. Graciela Husbands and a  cardiac catheterization was arranged today and performed by Dr. Salvadore Farber.  The patient has had no previous history of myocardial infarction,  no previous angioplasty or catheterization.   CARDIAC RISK FACTORS:  The patient has treated hypertension, has  hyperlipidemia, and she denies diabetes, denies smoking.   FAMILY HISTORY:  Family history is positive for congestive heart failure in  her father.  Mother died of colon cancer.  She has two sisters without  history of heart disease.  She denies any previous stroke, denies peripheral  vascular disease.   PAST MEDICAL HISTORY:  1. History of hypothyroid,  currently on Synthroid.  2. History of significant reflux symptoms, on Prevacid.   PAST SURGICAL HISTORY:  Past surgical history includes cholecystectomy six  years ago, hysterectomy in 1959.   SOCIAL HISTORY:  The patient is married, lives with her husband.  She has  been and remains an active individual.   MEDICATIONS AT TIME OF ADMISSION:  1. Aspirin 81 mg.  2. Avapro 150 mg.  3. Celebrex 200 mg every other day.  4. Premarin.  5. Synthroid 88 mcg.  6. Imdur 15 mg, which she just recently started taking.  7. Lopressor 25 mg b.i.d.  8. Prevacid once a day.   DRUG ALLERGIES:  None known.   REVIEW OF SYSTEMS:  CARDIAC:  Positive for chest pain and exertional  shortness of breath.  Denies resting shortness of breath, orthopnea, PND,  syncope, palpitations or lower extremity edema.  GENERAL:  The patient has  no constitutional  symptoms, denies fever or chills.  Weight has been stable.  Denies respiratory symptoms other than exertional shortness of breath.  Denies hemoptysis.  GASTROINTESTINAL:  Positive for reflux symptoms  including bitter taste in her mouth occasionally.  She notes frequent  stomach trouble.  NEUROLOGIC:  Denies amaurosis or TIAs.  MUSCULOSKELETAL:  Has history of bursitis in the right shoulder.  GU:  Denies any blood in her  urine.  No recent infections.  HEMATOLOGIC:  No bleeding dyscrasias noted.  ENDOCRINE:  She has treated hypothyroidism.  Denies diabetes.  PSYCHIATRIC:  Denies psychiatric history.  PERIPHERAL VASCULAR:  Denies peripheral  vascular disease.  HEENT:  Without complaints.  Other review of systems are  negative.   PHYSICAL EXAMINATION:  VITAL SIGNS:  Physical examination reveals a blood  pressure of 160/80, sinus rhythm at 70, O2 saturations 95%, respiratory rate  is 18.  GENERAL:  The patient is awake and alert, able to relate her history in  detail.  HEENT:  Pupils equal, round and reactive to light.  NECK:  Neck is without carotid bruits  or jugular venous distention.  LUNGS:  Lungs are clear bilaterally.  CARDIAC:  Exam reveals regular rate and rhythm without murmur or gallop.  ABDOMEN:  Exam is benign.  She does have several small incisions related to  her previous cholecystectomy.  There are no palpable masses.  EXTREMITIES:  Lower extremities reveal adequate veins at the ankles for  bypass.  She has 2+ DP and PT pulses bilaterally.  There are no ischemic  changes in the lower extremities.  NODES:  There are no palpable lymph nodes appreciated in the inguinal or  axillary areas.  NEUROLOGIC:  Exam is grossly intact.   LABORATORY FINDINGS:  INR is 1.0, pro time 11.4.  BUN is 22, creatinine 0.9.  Hemoglobin 12.9, hematocrit 37.6.  PTT 22.3.   Cardiac catheterization films were reviewed and reveal significant three-  vessel coronary artery disease with total occlusion of the mid right  coronary artery with bridging collaterals.  There is a 90% stenosis in the  PD at the very distal posterior descending.  The circumflex has a proximal  80%.  The LAD has a proximal 90%.  There are left-to-right collaterals  present.  Overall, LV function is preserved.   IMPRESSION:  Patient with positive stress test, positive symptoms of  exertional angina and cardiac catheterization which reveals significant  three-vessel coronary artery disease.  With the patient at age 28, active  and without significant co-morbidities, coronary artery bypass grafting has  been recommended as the best overall treatment for her severe three-vessel  symptomatic disease.  The risks of the procedure including risks of death,  infection, stroke, myocardial infarction, bleeding and blood transfusion  have all been discussed with the patient and her husband and daughter in  detail.  The risks of death, infection, stroke, myocardial infarction,  bleeding and blood transfusion have been reviewed.  Their questions have been answered and the patient is willing  to proceed.  We will tentatively  arrange for surgery February 08, 2002.                                                Gwenith Daily Tyrone Sage, M.D.    Tyson Babinski  D:  02/07/2002  T:  02/08/2002  Job:  [id number]

## 2010-06-27 NOTE — Op Note (Signed)
[name], [name]                        ACCOUNT NO.:  [id number]   MEDICAL RECORD NO.:  [id number]                   PATIENT TYPE:  INP   LOCATION:  2314                                 FACILITY:  [facility]   PHYSICIAN:  Gwenith Daily. Tyrone Sage, M.D.            DATE OF BIRTH:  [birth year]   DATE OF PROCEDURE:  02/08/2002  DATE OF DISCHARGE:                                 OPERATIVE REPORT   PREOPERATIVE DIAGNOSES:  Coronary occlusive disease.   POSTOPERATIVE DIAGNOSES:  Coronary occlusive disease.   OPERATION PERFORMED:  Coronary artery bypass grafting times four with the  left internal mammary artery to the left anterior descending coronary  artery, reversed saphenous vein graft to the circumflex coronary artery,  sequential reversed saphenous vein graft to the proximal and distal  posterior descending coronary artery with right thigh Endo vein harvesting.   SURGEON:  Gwenith Daily. Tyrone Sage, M.D.   ASSISTANT:  Lissa Merlin, P.A.   ANESTHESIA:  General endotracheal.   INDICATIONS FOR PROCEDURE:  The patient is a 77 year old female who  presented with a five to six month history of progressive anginal symptoms,  positive stress test.  Cardiac catheterization was performed which  demonstrated significant three-vessel coronary artery disease.  The patient  had greater than 80% stenosis of the proximal LAD, greater than 80% stenosis  of the proximal circumflex, sequential 70 to 80% stenosis of the right  coronary artery and a 90% stenosis in the mid posterior descending coronary  artery.  Overall ventricular function was preserved.  Because of the  patient's symptoms, positive stress test and three-vessel coronary artery  disease, coronary artery bypass grafting was recommended.  The patient  agreed and signed informed consent.   DESCRIPTION OF PROCEDURE:  With Swann-Ganz and arterial line monitors in  place, the patient underwent general endotracheal anesthesia without  incident.  The  skin of the chest and legs was prepped with Betadine and  draped in the usual sterile manner.  Using the [employer] Endo vein harvesting  system, vein was harvested from the right thigh and was of excellent quality  and caliber.  A median sternotomy was performed.  The left internal mammary  artery was dissected down as a pedicle graft.  The distal artery was divided  and had good free flow.  The pericardium was opened.  Overall ventricular  function appeared preserved.  The patient was systemically heparinized.  The  ascending aorta and the right atrium were cannulated in the aortic root.  A  bent cardioplegia needle was introduced into the ascending aorta.  The  patient was placed on cardiopulmonary bypass at 2.4L per minute per meter  squared.  Sites for anastomosis were selected and dissected out of the  epicardium.  The patient's body temperature was cooled to 30 degrees.  An  aortic crossclamp was applied.  500 cc of cold blood potassium cardioplegia  was administered with rapid diastolic arrest of the heart.  Myocardial  septal temperature was monitored throughout the crossclamp period.   Attention was turned first to the obtuse marginal coronary artery which was  a tortuous vessel but of good quality and reasonable size admitting a 1.5 mm  probe.  Using a running 7-0 Prolene, distal anastomosis was performed with a  segment of reversed saphenous vein.  Attention was then turned to the  posterior descending coronary artery.  The proximal portion of the posterior  descending coronary artery was opened.  A 1.5 mm probe passed proximally.  In the midportion of the posterior descending coronary artery, the one probe  would not pass the 90% lesion.  Distal to this, the posterior descending  coronary artery had numerous branches but was of suitable size to bypass.  A  sequential bypass was constructed with a longitudinal side-to-side  anastomosis with the proximal posterior descending.  The  same segment of  vein was carried to the distal portion of the posterior descending which was  opened and a distal anastomosis was carried out with a running 7-0 Prolene.  Attention was then turned to the left anterior descending coronary artery  which was opened in the midportion.  Using a running 8-0 Prolene, the left  internal mammary artery was anastomosed to the left anterior descending  coronary artery.  With release of the Edwards bulldog on the mammary artery,  there was prompt rise in myocardial septal temperature.  The aortic  crossclamp was removed with a total crossclamp time of 56 minutes.  The  patient spontaneously converted to a sinus rhythm.  A partial occlusion  clamp was placed on the ascending aorta.  Two punch aortotomies were  performed and each of the two vein grafts were anastomosed to the ascending  aorta.  Air was evacuated from the grafts and the partial occlusion clamp  was removed.  The sites of anastomosis were inspected and were free of  bleeding.  The patient was then ventilated and weaned from cardiopulmonary  bypass without difficulty.  She remained hemodynamically stable, was  decannulated in the usual fashion.  Protamine sulfate was administered.  With the operative field hemostatic, two atrial and two ventricular pacing  wires were applied.  Graft markers were applied.  A left pleural tube and  two mediastinal tubes were left in place.  Sternum was closed with #6  stainless steel wire.  Fascia closed with interrupted 0 Vicryl, running 3-0  Vicryl in the subcutaneous tissues and 4-0 subcuticular stitch in the skin  edges.  Dry dressings were applied.  Sponge and needle counts were reported  as correct at the completion of the procedure.  The patient did require  packed red blood cells because of low hematocrit while on bypass.  The  patient tolerated the procedure without obvious complication and was transferred to the surgical intensive care unit for  further postoperative  care.                                                 Gwenith Daily Tyrone Sage, M.D.    Tyson Babinski  D:  02/08/2002  T:  02/09/2002  Job:  [id number]   cc:   Salvadore Farber, M.D. [facility]  [address] [place], [place] [address]  Fax: [phone]

## 2010-07-31 ENCOUNTER — Encounter: Payer: Self-pay | Admitting: Cardiovascular Disease

## 2010-08-21 ENCOUNTER — Ambulatory Visit (INDEPENDENT_AMBULATORY_CARE_PROVIDER_SITE_OTHER): Payer: Medicare PPO | Admitting: Cardiovascular Disease

## 2010-08-21 ENCOUNTER — Encounter: Payer: Self-pay | Admitting: Cardiovascular Disease

## 2010-08-21 DIAGNOSIS — E785 Hyperlipidemia, unspecified: Secondary | ICD-10-CM

## 2010-08-21 DIAGNOSIS — I10 Essential (primary) hypertension: Secondary | ICD-10-CM

## 2010-08-21 DIAGNOSIS — I25810 Atherosclerosis of coronary artery bypass graft(s) without angina pectoris: Secondary | ICD-10-CM

## 2010-08-21 NOTE — Patient Instructions (Addendum)
You are doing well. No medication changes were made. Please call us if you have new issues that need to be addressed before your next appt.  We will call you for a follow up Appt. In 6 months  

## 2010-08-21 NOTE — Assessment & Plan Note (Signed)
Currently with no symptoms of angina. No further workup at this time. Continue current medication regimen. 

## 2010-08-21 NOTE — Assessment & Plan Note (Signed)
Cholesterol is mildly elevated above her goal. Normal LDL less than 70. Will continue simvastatin. She does not want Lipitor as this made her "sick ". Encouraged her to watch her diet especially desserts. If cholesterol continues to be elevated, could consider Crestor. She would prefer generic.

## 2010-08-21 NOTE — Progress Notes (Signed)
Patient ID: [name], female    DOB: 25-Jul-1922, 77 y.o.   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 She has a history of diverticular disease and significant GI bleeding.   Overall she reports that she is doing well. She has had no further diverticular bleeding. She reports that her blood pressure at home is typically 125/70. She tries to stay active. She has no complaints. No significant chest pain or shortness of breath..   EKG shows normal sinus rhythm with a rate of 56 beats per minute, no significant ST or T wave changes, First degree AV block noted    Outpatient Encounter Prescriptions as of 08/21/2010  Medication Sig Dispense Refill  . aspirin 81 MG EC tablet Take 81 mg by mouth daily.        . calcium-vitamin D (OSCAL WITH D) 500-200 MG-UNIT per tablet Take 1 tablet by mouth daily.        . clotrimazole-betamethasone (LOTRISONE) cream Apply 1 application topically as needed.      Marland Kitchen estrogens, conjugated, (PREMARIN) 0.625 MG tablet Take 0.625 mg by mouth daily. Take daily for 21 days then do not take for 7 days.       Marland Kitchen levothyroxine (SYNTHROID, LEVOTHROID) 75 MCG tablet Take 75 mcg by mouth daily.        Marland Kitchen losartan (COZAAR) 100 MG tablet Take 50 mg by mouth daily.        . metoprolol (TOPROL-XL) 100 MG 24 hr tablet Take 100 mg by mouth daily.        . Multiple Vitamin (MULTIVITAMIN) tablet Take 1 tablet by mouth daily.        . nitroGLYCERIN (NITROSTAT) 0.4 MG SL tablet Place 0.4 mg under the tongue every 5 (five) minutes as needed.        . simvastatin (ZOCOR) 40 MG tablet Take 40  mg by mouth at bedtime.           Review of Systems  Constitutional: Negative.   HENT: Negative.   Eyes: Negative.   Respiratory: Negative.   Cardiovascular: Negative.   Gastrointestinal: Negative.   Musculoskeletal: Negative.   Skin: Negative.   Neurological: Negative.   Hematological: Negative.   Psychiatric/Behavioral: Negative.   All other systems reviewed and are negative.    BP 140/75  Pulse 58  Ht 5\' 3"  (1.6 m)  Wt 122 lb (55.339 kg)  BMI 21.61 kg/m2  Physical Exam  Nursing note and vitals reviewed. Constitutional: She is oriented to person, place, and time. She appears well-developed and well-nourished.  HENT:  Head: Normocephalic.  Nose: Nose normal.  Mouth/Throat: Oropharynx is clear and moist.  Eyes: Conjunctivae are normal. Pupils are equal, round, and reactive to light.  Neck: Normal range of motion. Neck supple. No JVD present.  Cardiovascular: Normal rate, regular rhythm, S1 normal, S2 normal, normal heart sounds and intact distal pulses.  Exam reveals no gallop and no friction rub.   No murmur heard. Pulmonary/Chest: Effort normal and breath sounds normal. No respiratory distress. She has no wheezes. She has no rales. She exhibits no tenderness.  Abdominal: Soft. Bowel sounds  are normal. She exhibits no distension. There is no tenderness.  Musculoskeletal: Normal range of motion. She exhibits no edema and no tenderness.  Lymphadenopathy:    She has no cervical adenopathy.  Neurological: She is alert and oriented to person, place, and time. Coordination normal.  Skin: Skin is warm and dry. No rash noted. No erythema.  Psychiatric: She has a normal mood and affect. Her behavior is normal. Judgment and thought content normal.         Assessment and Plan

## 2010-08-21 NOTE — Assessment & Plan Note (Signed)
Blood pressure is well controlled on today's visit. No changes made to the medications. 

## 2011-01-06 ENCOUNTER — Ambulatory Visit: Payer: Self-pay | Admitting: Ophthalmology

## 2011-01-06 DIAGNOSIS — I10 Essential (primary) hypertension: Secondary | ICD-10-CM

## 2011-01-13 ENCOUNTER — Ambulatory Visit: Payer: Self-pay | Admitting: Ophthalmology

## 2011-01-26 ENCOUNTER — Ambulatory Visit: Payer: Self-pay | Admitting: Ophthalmology

## 2011-03-06 ENCOUNTER — Observation Stay: Payer: Self-pay | Admitting: Internal Medicine

## 2011-03-06 LAB — BASIC METABOLIC PANEL
Anion Gap: 10 (ref 7–16)
Calcium, Total: 9.3 mg/dL (ref 8.5–10.1)
Chloride: 104 mmol/L (ref 98–107)
Co2: 27 mmol/L (ref 21–32)
Creatinine: 1.18 mg/dL (ref 0.60–1.30)
EGFR (African American): 56 — ABNORMAL LOW
Glucose: 89 mg/dL (ref 65–99)
Sodium: 141 mmol/L (ref 136–145)

## 2011-03-06 LAB — CBC
HCT: 36.9 % (ref 35.0–47.0)
HGB: 12.5 g/dL (ref 12.0–16.0)
RDW: 12 % (ref 11.5–14.5)
WBC: 6.5 10*3/uL (ref 3.6–11.0)

## 2011-03-06 LAB — TROPONIN I: Troponin-I: <0.02 ng/mL

## 2011-03-07 LAB — CBC WITH DIFFERENTIAL/PLATELET
Basophil #: 0 10*3/uL (ref 0.0–0.1)
Basophil %: 0.7 %
Eosinophil %: 1.8 %
HCT: 33.6 % — ABNORMAL LOW (ref 35.0–47.0)
HGB: 11.4 g/dL — ABNORMAL LOW (ref 12.0–16.0)
Lymphocyte #: 1.6 10*3/uL (ref 1.0–3.6)
MCH: 32.7 pg (ref 26.0–34.0)
MCV: 96 fL (ref 80–100)
Monocyte %: 11 %
Neutrophil #: 3.2 10*3/uL (ref 1.4–6.5)
Platelet: 169 10*3/uL (ref 150–440)
RBC: 3.49 10*6/uL — ABNORMAL LOW (ref 3.80–5.20)

## 2011-03-07 LAB — CK TOTAL AND CKMB (NOT AT ARMC)
CK, Total: 86 U/L (ref 21–215)
CK-MB: 1.6 ng/mL (ref 0.5–3.6)

## 2011-03-07 LAB — BASIC METABOLIC PANEL
Anion Gap: 11 (ref 7–16)
Calcium, Total: 8.7 mg/dL (ref 8.5–10.1)
Co2: 26 mmol/L (ref 21–32)
EGFR (African American): 58 — ABNORMAL LOW
Glucose: 103 mg/dL — ABNORMAL HIGH (ref 65–99)
Sodium: 146 mmol/L — ABNORMAL HIGH (ref 136–145)

## 2011-03-07 LAB — TROPONIN I: Troponin-I: <0.02 ng/mL

## 2011-03-09 ENCOUNTER — Ambulatory Visit (INDEPENDENT_AMBULATORY_CARE_PROVIDER_SITE_OTHER): Payer: Medicare PPO | Admitting: Cardiovascular Disease

## 2011-03-09 ENCOUNTER — Encounter: Payer: Self-pay | Admitting: Cardiovascular Disease

## 2011-03-09 VITALS — BP 102/62 | HR 56 | Ht 63.0 in | Wt 122.0 lb

## 2011-03-09 DIAGNOSIS — E785 Hyperlipidemia, unspecified: Secondary | ICD-10-CM

## 2011-03-09 DIAGNOSIS — I25810 Atherosclerosis of coronary artery bypass graft(s) without angina pectoris: Secondary | ICD-10-CM

## 2011-03-09 DIAGNOSIS — I10 Essential (primary) hypertension: Secondary | ICD-10-CM

## 2011-03-09 MED ORDER — NITROGLYCERIN 0.4 MG SL SUBL: 0.4000 mg | SUBLINGUAL_TABLET | SUBLINGUAL | Status: DC | PRN

## 2011-03-09 NOTE — Assessment & Plan Note (Signed)
Cholesterol is at goal on the current lipid regimen. No changes to the medications were made.  

## 2011-03-09 NOTE — Patient Instructions (Addendum)
You are doing well. Please take the nitroglycerin for chest pain  Please call us if you have new issues that need to be addressed before your next appt.  Your physician wants you to follow-up in: 6 months.  You will receive a reminder letter in the mail two months in advance. If you don't receive a letter, please call our office to schedule the follow-up appointment.  Please ask the pharmacy if you can cut your toprol in 1/2 or do we need a lower dose called in

## 2011-03-09 NOTE — Progress Notes (Signed)
Patient ID: [name], female    DOB: [birth year], 78 y.o.   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 She has a history of diverticular disease and significant GI bleeding.  She reports that at the end of last week, she woke in the middle of the night at 2 AM with bilateral chest pain. Symptoms were significant. Her family convinced her to go to the emergency room and she presented on Friday. She ruled out for MI, EKG was relatively unchanged here she had a stress test/pharmacologic study Myoview which  showed no ischemia. She has not had further chest pain since her discharge and currently feels well. She has not had significant chest pain  since her bypass surgery.  EKG shows normal sinus rhythm with a rate of 56 beats per minute, no significant ST or T wave changes, First degree AV block noted    Outpatient Encounter Prescriptions as of 76/28/2013  Medication Sig Dispense Refill  . aspirin 81 MG EC tablet Take 81 mg by mouth daily.        . calcium-vitamin D (OSCAL WITH D) 500-200 MG-UNIT per tablet Take 1 tablet by mouth daily.        . clotrimazole-betamethasone (LOTRISONE) cream Apply 1 application topically as needed.      Marland Kitchen estrogens, conjugated, (PREMARIN) 0.625 MG tablet Take 0.625 mg by mouth daily. Take daily for 21 days then do not take for 7 days.       Marland Kitchen levothyroxine (SYNTHROID, LEVOTHROID) 50 MCG tablet Take 50 mcg by mouth daily.      Marland Kitchen losartan (COZAAR) 100 MG tablet Take 50 mg by mouth daily.        . metoprolol (TOPROL-XL) 100 MG 24 hr tablet Take 100 mg by mouth daily.         . Multiple Vitamin (MULTIVITAMIN) tablet Take 1 tablet by mouth daily.        . nitroGLYCERIN (NITROSTAT) 0.4 MG SL tablet Place 1 tablet (0.4 mg total) under the tongue every 5 (five) minutes as needed.  25 tablet  6  . simvastatin (ZOCOR) 40 MG tablet Take 40 mg by mouth at bedtime.        Marland Kitchen DISCONTD: nitroGLYCERIN (NITROSTAT) 0.4 MG SL tablet Place 0.4 mg under the tongue every 5 (five) minutes as needed.       Marland Kitchen DISCONTD: levothyroxine (SYNTHROID, LEVOTHROID) 75 MCG tablet Take 75 mcg by mouth daily.          Review of Systems  Constitutional: Negative.   HENT: Negative.   Eyes: Negative.   Respiratory: Negative.   Cardiovascular: Negative.        Episode of chest pain, none since then.  Gastrointestinal: Negative.   Musculoskeletal: Negative.   Skin: Negative.   Neurological: Negative.   Hematological: Negative.   Psychiatric/Behavioral: Negative.   All other systems reviewed and are negative.    BP 102/62  Pulse 56  Ht 5\' 3"  (1.6 m)  Wt 122 lb (55.339 kg)  BMI 21.61 kg/m2  Physical Exam  Nursing note and vitals reviewed. Constitutional: She is oriented to person, place, and  time. She appears well-developed and well-nourished.  HENT:  Head: Normocephalic.  Nose: Nose normal.  Mouth/Throat: Oropharynx is clear and moist.  Eyes: Conjunctivae are normal. Pupils are equal, round, and reactive to light.  Neck: Normal range of motion. Neck supple. No JVD present.  Cardiovascular: Normal rate, regular rhythm, S1 normal, S2 normal, normal heart sounds and intact distal pulses.  Exam reveals no gallop and no friction rub.   No murmur heard. Pulmonary/Chest: Effort normal and breath sounds normal. No respiratory distress. She has no wheezes. She has no rales. She exhibits no tenderness.  Abdominal: Soft. Bowel sounds are normal. She exhibits no distension. There is no tenderness.  Musculoskeletal: Normal range of motion. She exhibits no edema and no tenderness.    Lymphadenopathy:    She has no cervical adenopathy.  Neurological: She is alert and oriented to person, place, and time. Coordination normal.  Skin: Skin is warm and dry. No rash noted. No erythema.  Psychiatric: She has a normal mood and affect. Her behavior is normal. Judgment and thought content normal.         Assessment and Plan

## 2011-03-09 NOTE — Assessment & Plan Note (Signed)
Recent chest pain, stress test last week showing no ischemia. No further symptoms. We have asked her to contact us if she has any further chest pain. We will call and nitroglycerin to take p.r.n. For symptoms.

## 2011-03-09 NOTE — Assessment & Plan Note (Addendum)
Blood pressure is lobe on today's visit and she has bradycardia. We have suggested she cut her metoprolol in 1/2. On her next prescription, metoprolol succinate could be changed to 50 mg daily.

## 2011-10-27 ENCOUNTER — Emergency Department: Payer: Self-pay | Admitting: Emergency Medicine

## 2011-10-27 LAB — URINALYSIS, COMPLETE
Bilirubin,UR: NEGATIVE
Blood: NEGATIVE
Hyaline Cast: 1
Nitrite: NEGATIVE
Ph: 5 (ref 4.5–8.0)
Protein: NEGATIVE
Specific Gravity: 1.017 (ref 1.003–1.030)
WBC UR: <1 /HPF (ref 0–5)

## 2011-10-27 LAB — COMPREHENSIVE METABOLIC PANEL
Anion Gap: 11 (ref 7–16)
BUN: 21 mg/dL — ABNORMAL HIGH (ref 7–18)
Chloride: 106 mmol/L (ref 98–107)
Creatinine: 1.25 mg/dL (ref 0.60–1.30)
EGFR (African American): 45 — ABNORMAL LOW
Osmolality: 282 (ref 275–301)
Sodium: 140 mmol/L (ref 136–145)
Total Protein: 6.6 g/dL (ref 6.4–8.2)

## 2011-10-27 LAB — CBC
HGB: 12.3 g/dL (ref 12.0–16.0)
MCH: 31.9 pg (ref 26.0–34.0)
MCV: 93 fL (ref 80–100)
RBC: 3.84 10*6/uL (ref 3.80–5.20)

## 2011-10-27 LAB — TROPONIN I: Troponin-I: <0.02 ng/mL

## 2011-11-10 ENCOUNTER — Encounter: Payer: Self-pay | Admitting: Cardiovascular Disease

## 2011-11-10 ENCOUNTER — Ambulatory Visit (INDEPENDENT_AMBULATORY_CARE_PROVIDER_SITE_OTHER): Payer: Medicare PPO | Admitting: Cardiovascular Disease

## 2011-11-10 VITALS — BP 134/72 | HR 58 | Ht 63.0 in | Wt 127.0 lb

## 2011-11-10 DIAGNOSIS — E785 Hyperlipidemia, unspecified: Secondary | ICD-10-CM

## 2011-11-10 DIAGNOSIS — I10 Essential (primary) hypertension: Secondary | ICD-10-CM

## 2011-11-10 DIAGNOSIS — I25810 Atherosclerosis of coronary artery bypass graft(s) without angina pectoris: Secondary | ICD-10-CM

## 2011-11-10 NOTE — Patient Instructions (Addendum)
You are doing well. No medication changes were made.  Continue to hold the losartan  Please call us if you have new issues that need to be addressed before your next appt.  Your physician wants you to follow-up in: 6 months.  You will receive a reminder letter in the mail two months in advance. If you don't receive a letter, please call our office to schedule the follow-up appointment.

## 2011-11-10 NOTE — Progress Notes (Signed)
Patient ID: [name], female    DOB: 01-16-1923, 78 y.o.   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 She has a history of diverticular disease and significant GI bleeding.  She reports that she has been doing well. She did have a fall 2 weeks ago while walking across the street. She bruised the left side of her hip and leg. Marland Kitchen Her husband died approximately 3 months ago and she is still adjusting to being alone. He died of complications while in the hospital. She is otherwise active, denies any significant shortness of breath or chest pain. He does report that losartan was stopped by Dr. [name] for low blood pressure.  EKG shows normal sinus rhythm with a rate of 58 beats per minute, no significant ST or T wave changes    Outpatient Encounter Prescriptions as of 11/10/2011  Medication Sig Dispense Refill  . aspirin 81 MG EC tablet Take 81 mg by mouth daily.        . calcium-vitamin D (OSCAL WITH D) 500-200 MG-UNIT per tablet Take 1 tablet by mouth daily.        . clotrimazole-betamethasone (LOTRISONE) cream Apply 1 application topically as needed.      . folic acid (FOLVITE) 1 MG tablet Take 1 mg by mouth daily.      Marland Kitchen levothyroxine (SYNTHROID, LEVOTHROID) 50 MCG tablet Take 50 mcg by mouth daily.      . metoprolol (TOPROL-XL) 100 MG 24 hr tablet Take 100 mg by mouth daily.        . Multiple Vitamin (MULTIVITAMIN) tablet Take 1 tablet by mouth daily.        . nitroGLYCERIN (NITROSTAT) 0.4 MG SL tablet Place 1 tablet (0.4 mg total) under the tongue every 5 (five) minutes as needed.  25 tablet   6  . simvastatin (ZOCOR) 40 MG tablet Take 40 mg by mouth at bedtime.        Marland Kitchen DISCONTD: estrogens, conjugated, (PREMARIN) 0.625 MG tablet Take 0.625 mg by mouth daily. Take daily for 21 days then do not take for 7 days.       Marland Kitchen DISCONTD: losartan (COZAAR) 100 MG tablet Take 50 mg by mouth daily.          Review of Systems  Constitutional: Negative.   HENT: Negative.   Eyes: Negative.   Respiratory: Negative.   Cardiovascular: Negative.        Episode of chest pain, none since then.  Gastrointestinal: Negative.   Musculoskeletal: Negative.   Skin: Negative.   Neurological: Negative.   Hematological: Negative.   Psychiatric/Behavioral: Negative.   All other systems reviewed and are negative.    BP 134/72  Pulse 58  Ht 5\' 3"  (1.6 m)  Wt 127 lb (57.607 kg)  BMI 22.50 kg/m2  Physical Exam  Nursing note and vitals reviewed. Constitutional: She is oriented to person, place, and time. She appears well-developed and well-nourished.  HENT:  Head: Normocephalic.  Nose: Nose normal.  Mouth/Throat: Oropharynx is clear and moist.  Eyes: Conjunctivae normal are normal. Pupils are equal, round, and reactive to light.  Neck: Normal range  of motion. Neck supple. No JVD present.  Cardiovascular: Normal rate, regular rhythm, S1 normal, S2 normal, normal heart sounds and intact distal pulses.  Exam reveals no gallop and no friction rub.   No murmur heard. Pulmonary/Chest: Effort normal and breath sounds normal. No respiratory distress. She has no wheezes. She has no rales. She exhibits no tenderness.  Abdominal: Soft. Bowel sounds are normal. She exhibits no distension. There is no tenderness.  Musculoskeletal: Normal range of motion. She exhibits no edema and no tenderness.  Lymphadenopathy:    She has no cervical adenopathy.  Neurological: She is alert and oriented to person, place, and time. Coordination normal.  Skin: Skin is warm and dry. No rash noted. No erythema.  Psychiatric: She  has a normal mood and affect. Her behavior is normal. Judgment and thought content normal.         Assessment and Plan

## 2011-11-10 NOTE — Assessment & Plan Note (Signed)
We have suggested that she stay on her simvastatin 

## 2011-11-10 NOTE — Assessment & Plan Note (Signed)
Losartan was recently stopped. Blood pressure is well controlled today. She will likely not need to restart the losartan.

## 2011-11-10 NOTE — Assessment & Plan Note (Signed)
Currently with no symptoms of angina. No further workup at this time. Continue current medication regimen. 

## 2011-12-14 ENCOUNTER — Telehealth: Payer: Self-pay | Admitting: Cardiovascular Disease

## 2011-12-14 NOTE — Telephone Encounter (Signed)
[email or web address]

## 2011-12-14 NOTE — Telephone Encounter (Signed)
Pt daughter calling states that pt is having a lot of pan in her legs. Pt had a fall about 2 months ago and has been aching.

## 2011-12-16 NOTE — Telephone Encounter (Signed)
lmtcb

## 2011-12-16 NOTE — Telephone Encounter (Signed)
Pt's dtr called back. Says pt had a fall recently and has been c/o leg pain ever since. They decided to make appt with ortho MD instead of [place]. She has appt next week. Will see what ortho says. If ortho feels this is unrelated to fall, dtr will call us back in the case we need to hold simvastatin.

## 2012-01-10 ENCOUNTER — Emergency Department (HOSPITAL_COMMUNITY)
Admission: EM | Admit: 2012-01-10 | Discharge: 2012-01-10 | Disposition: A | Discharge status: Home / Self Care | Payer: Medicare PPO | Attending: Emergency Medicine | Admitting: Emergency Medicine

## 2012-01-10 ENCOUNTER — Encounter (HOSPITAL_COMMUNITY): Payer: Self-pay | Admitting: Nurse Practitioner

## 2012-01-10 ENCOUNTER — Emergency Department (HOSPITAL_COMMUNITY): Payer: Medicare PPO

## 2012-01-10 DIAGNOSIS — R531 Weakness: Secondary | ICD-10-CM

## 2012-01-10 DIAGNOSIS — R5383 Other fatigue: Secondary | ICD-10-CM | POA: Insufficient documentation

## 2012-01-10 DIAGNOSIS — E785 Hyperlipidemia, unspecified: Secondary | ICD-10-CM | POA: Insufficient documentation

## 2012-01-10 DIAGNOSIS — Z79899 Other long term (current) drug therapy: Secondary | ICD-10-CM | POA: Insufficient documentation

## 2012-01-10 DIAGNOSIS — K219 Gastro-esophageal reflux disease without esophagitis: Secondary | ICD-10-CM | POA: Insufficient documentation

## 2012-01-10 DIAGNOSIS — R0989 Other specified symptoms and signs involving the circulatory and respiratory systems: Secondary | ICD-10-CM | POA: Insufficient documentation

## 2012-01-10 DIAGNOSIS — Z7982 Long term (current) use of aspirin: Secondary | ICD-10-CM | POA: Insufficient documentation

## 2012-01-10 DIAGNOSIS — M25559 Pain in unspecified hip: Secondary | ICD-10-CM | POA: Insufficient documentation

## 2012-01-10 DIAGNOSIS — F29 Unspecified psychosis not due to a substance or known physiological condition: Secondary | ICD-10-CM | POA: Insufficient documentation

## 2012-01-10 DIAGNOSIS — M2550 Pain in unspecified joint: Secondary | ICD-10-CM | POA: Insufficient documentation

## 2012-01-10 DIAGNOSIS — R5381 Other malaise: Secondary | ICD-10-CM | POA: Insufficient documentation

## 2012-01-10 DIAGNOSIS — R0609 Other forms of dyspnea: Secondary | ICD-10-CM | POA: Insufficient documentation

## 2012-01-10 DIAGNOSIS — I10 Essential (primary) hypertension: Secondary | ICD-10-CM | POA: Insufficient documentation

## 2012-01-10 DIAGNOSIS — E039 Hypothyroidism, unspecified: Secondary | ICD-10-CM | POA: Insufficient documentation

## 2012-01-10 LAB — CBC WITH DIFFERENTIAL/PLATELET
Basophils Absolute: 0.1 10*3/uL (ref 0.0–0.1)
Basophils Relative: 1 % (ref 0–1)
Eosinophils Absolute: 0.1 10*3/uL (ref 0.0–0.7)
MCH: 31.6 pg (ref 26.0–34.0)
MCHC: 34.1 g/dL (ref 30.0–36.0)
Monocytes Relative: 9 % (ref 3–12)
Neutrophils Relative %: 64 % (ref 43–77)
Platelets: 200 10*3/uL (ref 150–400)
RDW: 12.9 % (ref 11.5–15.5)

## 2012-01-10 LAB — COMPREHENSIVE METABOLIC PANEL
Albumin: 3.5 g/dL (ref 3.5–5.2)
Alkaline Phosphatase: 51 U/L (ref 39–117)
BUN: 18 mg/dL (ref 6–23)
Potassium: 3.9 mEq/L (ref 3.5–5.1)
Sodium: 139 mEq/L (ref 135–145)
Total Protein: 6.6 g/dL (ref 6.0–8.3)

## 2012-01-10 LAB — URINALYSIS, ROUTINE W REFLEX MICROSCOPIC
Glucose, UA: NEGATIVE mg/dL
Ketones, ur: NEGATIVE mg/dL
Leukocytes, UA: NEGATIVE
pH: 6.5 (ref 5.0–8.0)

## 2012-01-10 MED ORDER — ACETAMINOPHEN 325 MG PO TABS
650.0000 mg | ORAL_TABLET | Freq: Once | ORAL | Status: AC
  Administered 2012-01-10: 650 mg via ORAL
  Filled 2012-01-10: qty 2

## 2012-01-10 NOTE — ED Notes (Signed)
MD at bedside. 

## 2012-01-10 NOTE — ED Notes (Addendum)
Pt. States she had an unwitnessedd about a week ago. Small bump on right medial forehead noted.

## 2012-01-10 NOTE — ED Notes (Signed)
Patient transported to CT 

## 2012-01-10 NOTE — ED Provider Notes (Signed)
History     CSN: [id number]  Arrival date & time 01/10/12  0941   First MD Initiated Contact with Patient 01/10/12 [phone]      Chief Complaint  Patient presents with  . Weakness    (Consider location/radiation/quality/duration/timing/severity/associated sxs/prior treatment) HPI Comments: Patient presents with a one-week history of worsening weakness and confusion. She was living on room but since this has been going on her daughter has been living with her. She had a fall about 2 months ago when she's had some hip pain since then but over the last week she's had 2 or 3 more falls and her daughter says that she's been increasingly confused and generalized weakness. She denies any recent fevers or chills. Denies a cough or chest congestion. Denies any chest pain or tightness. Denies any shortness of breath. Denies any urinary symptoms. Denies any headache. Denies any unilateral numbness or weakness. Denies any speech difficulties.  Patient is a 78 y.o. female presenting with weakness.  Weakness Primary symptoms do not include headaches, dizziness, fever, nausea or vomiting.  Additional symptoms include weakness.    Past Medical History  Diagnosis Date  . Hyperlipidemia   . Hypertension   . Hypothyroidism   . GERD (gastroesophageal reflux disease)   . Chest pain, unspecified   . Dyspnea     Past Surgical History  Procedure Date  . Cholecystectomy   . Abdominal hysterectomy   . Coronary artery bypass graft     Family History  Problem Relation Age of Onset  . Heart failure Other     CHF  . Cancer Other   . Diabetes Other     History  Substance Use Topics  . Smoking status: Never Smoker   . Smokeless tobacco: Never Used  . Alcohol Use: No    OB History    Grav Para Term Preterm Abortions TAB SAB Ect Mult Living                  Review of Systems  Constitutional: Negative for fever, chills, diaphoresis and fatigue.  HENT: Negative for congestion, rhinorrhea and  sneezing.   Eyes: Negative.   Respiratory: Negative for cough, chest tightness and shortness of breath.   Cardiovascular: Negative for chest pain and leg swelling.  Gastrointestinal: Negative for nausea, vomiting, abdominal pain, diarrhea and blood in stool.  Genitourinary: Negative for frequency, hematuria, flank pain and difficulty urinating.  Musculoskeletal: Positive for arthralgias. Negative for back pain.  Skin: Negative for rash.  Neurological: Positive for weakness. Negative for dizziness, facial asymmetry, speech difficulty, numbness and headaches.  Psychiatric/Behavioral: Positive for confusion.    Allergies  Iodine  Home Medications   Current Outpatient Rx  Name  Route  Sig  Dispense  Refill  . ALPRAZOLAM 1 MG PO TABS   Oral   Take 1 mg by mouth 2 (two) times daily as needed. For anxiety         . ASPIRIN 81 MG PO TBEC   Oral   Take 81 mg by mouth daily.           Marland Kitchen CALCIUM CARBONATE-VITAMIN D 500-200 MG-UNIT PO TABS   Oral   Take 1 tablet by mouth daily.           Marland Kitchen CLOTRIMAZOLE-BETAMETHASONE 1-0.05 % EX CREA   Topical   Apply 1 application topically as needed.         Marland Kitchen FOLIC ACID 1 MG PO TABS   Oral   Take 1 mg  by mouth daily.         Marland Kitchen LEVOTHYROXINE SODIUM 50 MCG PO TABS   Oral   Take 50 mcg by mouth daily.         Marland Kitchen METOPROLOL SUCCINATE ER 100 MG PO TB24   Oral   Take 100 mg by mouth daily.           Marland Kitchen ONE-DAILY MULTI VITAMINS PO TABS   Oral   Take 1 tablet by mouth daily.           Marland Kitchen NITROGLYCERIN 0.4 MG SL SUBL   Sublingual   Place 1 tablet (0.4 mg total) under the tongue every 5 (five) minutes as needed.   25 tablet   6   . SIMVASTATIN 40 MG PO TABS   Oral   Take 40 mg by mouth at bedtime.             BP 153/78  Pulse 59  Temp 97.6 F (36.4 C) (Oral)  Resp 16  SpO2 100%  Physical Exam  Constitutional: She is oriented to person, place, and time. She appears well-developed and well-nourished.  HENT:  Head:  Normocephalic and atraumatic.  Eyes: Pupils are equal, round, and reactive to light.  Neck: Normal range of motion. Neck supple.  Cardiovascular: Normal rate, regular rhythm and normal heart sounds.   Pulmonary/Chest: Effort normal and breath sounds normal. No respiratory distress. She has no wheezes. She has no rales. She exhibits no tenderness.  Abdominal: Soft. Bowel sounds are normal. There is no tenderness. There is no rebound and no guarding.  Musculoskeletal: Normal range of motion. She exhibits no edema.       No pain with palpation or ROM of extremities, specifically hips.  No pain along back  Lymphadenopathy:    She has no cervical adenopathy.  Neurological: She is alert and oriented to person, place, and time. She has normal strength. No cranial nerve deficit or sensory deficit. GCS eye subscore is 4. GCS verbal subscore is 5. GCS motor subscore is 6.  Skin: Skin is warm and dry. No rash noted.  Psychiatric: She has a normal mood and affect.    ED Course  Procedures (including critical care time)  Results for orders placed during the hospital encounter of 01/10/12  CBC WITH DIFFERENTIAL      Component Value Range   WBC 5.9  4.0 - 10.5 K/uL   RBC 3.95  3.87 - 5.11 MIL/uL   Hemoglobin 12.5  12.0 - 15.0 g/dL   HCT 16.1  09.6 - 04.5 %   MCV 92.9  78.0 - 100.0 fL   MCH 31.6  26.0 - 34.0 pg   MCHC 34.1  30.0 - 36.0 g/dL   RDW 40.9  81.1 - 91.4 %   Platelets 200  150 - 400 K/uL   Neutrophils Relative 64  43 - 77 %   Neutro Abs 3.8  1.7 - 7.7 K/uL   Lymphocytes Relative 23  12 - 46 %   Lymphs Abs 1.4  0.7 - 4.0 K/uL   Monocytes Relative 9  3 - 12 %   Monocytes Absolute 0.6  0.1 - 1.0 K/uL   Eosinophils Relative 2  0 - 5 %   Eosinophils Absolute 0.1  0.0 - 0.7 K/uL   Basophils Relative 1  0 - 1 %   Basophils Absolute 0.1  0.0 - 0.1 K/uL  COMPREHENSIVE METABOLIC PANEL      Component Value Range   Sodium  139  135 - 145 mEq/L   Potassium 3.9  3.5 - 5.1 mEq/L   Chloride 104   96 - 112 mEq/L   CO2 28  19 - 32 mEq/L   Glucose, Bld 86  70 - 99 mg/dL   BUN 18  6 - 23 mg/dL   Creatinine, Ser 1.61  0.50 - 1.10 mg/dL   Calcium 9.8  8.4 - 09.6 mg/dL   Total Protein 6.6  6.0 - 8.3 g/dL   Albumin 3.5  3.5 - 5.2 g/dL   AST 28  0 - 37 U/L   ALT 19  0 - 35 U/L   Alkaline Phosphatase 51  39 - 117 U/L   Total Bilirubin 0.5  0.3 - 1.2 mg/dL   GFR calc non Af Amer 49 (*) >90 mL/min   GFR calc Af Amer 56 (*) >90 mL/min  URINALYSIS, ROUTINE W REFLEX MICROSCOPIC      Component Value Range   Color, Urine YELLOW  YELLOW   APPearance CLEAR  CLEAR   Specific Gravity, Urine 1.006  1.005 - 1.030   pH 6.5  5.0 - 8.0   Glucose, UA NEGATIVE  NEGATIVE mg/dL   Hgb urine dipstick NEGATIVE  NEGATIVE   Bilirubin Urine NEGATIVE  NEGATIVE   Ketones, ur NEGATIVE  NEGATIVE mg/dL   Protein, ur NEGATIVE  NEGATIVE mg/dL   Urobilinogen, UA 0.2  0.0 - 1.0 mg/dL   Nitrite NEGATIVE  NEGATIVE   Leukocytes, UA NEGATIVE  NEGATIVE   Dg Chest 2 View  01/10/2012  *RADIOLOGY REPORT*  Clinical Data: Fall 3 weeks ago.  Generalized weakness.  Chest pain.  Prior CABG.  CHEST - 2 VIEW  Comparison: Two-view chest x-ray 09/03/2008.  Findings: Prior sternotomy CABG.  Cardiac silhouette normal in size.  Thoracic aorta atherosclerotic, unchanged.  Hilar and mediastinal contours otherwise unremarkable.  Suboptimal inspiration.  Calcified granuloma in the left lower lobe, unchanged.  Lungs otherwise clear.  Pulmonary vascularity normal. No pleural effusions.  Exaggeration of the usual thoracic kyphosis.  IMPRESSION: Suboptimal inspiration.  No acute cardiopulmonary disease.   Original Report Authenticated By: Hulan Saas, M.D.    Ct Head Wo Contrast  01/10/2012  *RADIOLOGY REPORT*  Clinical Data: Confusion.  Falls.  CT HEAD WITHOUT CONTRAST  Technique:  Contiguous axial images were obtained from the base of the skull through the vertex without contrast.  Comparison: CT 09/03/2008  Findings: Generalized atrophy.   Chronic microvascular ischemia in the cerebral white matter and left internal capsule, unchanged from the  prior study.  Negative for acute infarct.  Negative for hemorrhage or mass lesion.  Calvarium is intact.  Atherosclerotic calcification in the carotid and vertebral arteries bilaterally.  IMPRESSION: Atrophy and chronic microvascular ischemia.  No acute intracranial abnormality.   Original Report Authenticated By: Janeece Riggers, M.D.       Date: 01/10/2012  Rate: 58  Rhythm: sinus bradycardia  QRS Axis: left  Intervals: PR prolonged  ST/T Wave abnormalities: nonspecific ST/T changes  Conduction Disutrbances:none1st degree AV block  Narrative Interpretation:   Old EKG Reviewed: unchanged   1. Weakness   2. Hip pain       MDM  Patient with a one-week history of some intermittent confusion and generalized weakness. The patient feels like her weakness and falls are due to her leg pain which has been going on for about 2 months now. I don't find any neuro deficits or stroke signs. I don't find any source of infection. Patient is alert  and oriented now. I don't suspect a hip fracture as I can move her legs all around without significant pain. I discussed with the family getting an MRI however at this point the patient does not want to do this and is to follow with her primary care physician. They will follow with her doctors in [place] within the next few days and I advised him if her symptoms worsen to return here to the emergency department.        Rolan Bucco, MD 01/10/12 [phone]

## 2012-01-10 NOTE — ED Notes (Signed)
Family states over past week pt has become weak, confused, and has fallen at least 3 times. States prior to this week pt completed ADLs independently and was always  A&Ox4. Pt c/o feet swelling, bilateral knee and hip pain. Pt is A&Ox4 now.

## 2012-01-29 ENCOUNTER — Ambulatory Visit: Payer: Self-pay | Admitting: Internal Medicine

## 2012-02-04 ENCOUNTER — Ambulatory Visit: Payer: Self-pay | Admitting: Physical Medicine and Rehabilitation

## 2012-05-10 ENCOUNTER — Encounter: Payer: Self-pay | Admitting: Cardiovascular Disease

## 2012-05-10 ENCOUNTER — Ambulatory Visit (INDEPENDENT_AMBULATORY_CARE_PROVIDER_SITE_OTHER): Payer: Medicare PPO | Admitting: Cardiovascular Disease

## 2012-05-10 ENCOUNTER — Telehealth: Payer: Self-pay

## 2012-05-10 VITALS — BP 140/82 | HR 69 | Ht 62.0 in | Wt 131.2 lb

## 2012-05-10 DIAGNOSIS — I10 Essential (primary) hypertension: Secondary | ICD-10-CM

## 2012-05-10 DIAGNOSIS — I25810 Atherosclerosis of coronary artery bypass graft(s) without angina pectoris: Secondary | ICD-10-CM

## 2012-05-10 DIAGNOSIS — E785 Hyperlipidemia, unspecified: Secondary | ICD-10-CM

## 2012-05-10 DIAGNOSIS — R079 Chest pain, unspecified: Secondary | ICD-10-CM

## 2012-05-10 MED ORDER — METOPROLOL SUCCINATE ER 100 MG PO TB24: 50.0000 mg | ORAL_TABLET | Freq: Every day | ORAL | Status: DC

## 2012-05-10 NOTE — Telephone Encounter (Signed)
Pt called to states she is cutting this medication in 1/2 Metroprolol ertab

## 2012-05-10 NOTE — Assessment & Plan Note (Signed)
No recent episodes of chest pain

## 2012-05-10 NOTE — Patient Instructions (Addendum)
You are doing well. No medication changes were made.  Please call the office and let us know which pill you are cutting in 1/2  We will need to see how the new cholesterol level is when you see Dr. [name]. If the number is high, we may need to increase your cholesterol pill  Please call us if you have new issues that need to be addressed before your next appt.  Your physician wants you to follow-up in: 6 months.  You will receive a reminder letter in the mail two months in advance. If you don't receive a letter, please call our office to schedule the follow-up appointment.

## 2012-05-10 NOTE — Assessment & Plan Note (Signed)
Cholesterol is high in 2013. We have suggested we wait and look at her new cholesterol numbers for 2014. Goal LDL less than 70

## 2012-05-10 NOTE — Assessment & Plan Note (Signed)
Blood pressure is well controlled on today's visit. No changes made to the medications. Losartan on hold and she is taking one half pill metoprolol with good results

## 2012-05-10 NOTE — Progress Notes (Signed)
Patient ID: [name], female    DOB: 11-Jan-1923, 79 y.o.   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 in March 2010 who presents for routine followup. She has a history of diverticular disease and significant GI bleeding.  Previous history of falls. Recently received cortisone shots to her back and hips with significant improvement of her pain. She reports that she has been doing well  Her husband died In 06/22/11, adjusting better to being on. He died of complications while in the hospital. She is otherwise active, denies any significant shortness of breath or chest pain.  She is taking one half metoprolol daily, 50 mg daily Losartan was stopped 4 low blood pressure  Lab work in July 2013 shows total cholesterol 176, LDL 87, HDL 70  EKG shows normal sinus rhythm with a rate of 69 beats per minute, no significant ST or T wave changes    Outpatient Encounter Prescriptions as of 05/10/2012  Medication Sig Dispense Refill  . ALPRAZolam (XANAX) 1 MG tablet Take 1 mg by mouth 2 (two) times daily as needed. For anxiety      . aspirin 81 MG EC tablet Take 81 mg by mouth daily.        . calcium-vitamin D (OSCAL WITH D) 500-200 MG-UNIT per tablet Take 1 tablet by mouth daily.        . clotrimazole-betamethasone (LOTRISONE) cream Apply 1 application topically as needed.      . folic acid (FOLVITE) 1 MG tablet Take 1 mg by mouth daily.      Marland Kitchen levothyroxine (SYNTHROID, LEVOTHROID) 75 MCG tablet Take 75 mcg by mouth daily.      . metoprolol (TOPROL-XL) 100 MG 24 hr tablet Take 100 mg by mouth daily.        . Multiple Vitamin (MULTIVITAMIN) tablet Take 1  tablet by mouth daily.        . nitroGLYCERIN (NITROSTAT) 0.4 MG SL tablet Place 1 tablet (0.4 mg total) under the tongue every 5 (five) minutes as needed.  25 tablet  6  . omeprazole (PRILOSEC) 20 MG capsule Take 20 mg by mouth daily.      . simvastatin (ZOCOR) 40 MG tablet Take 40 mg by mouth at bedtime.        . [DISCONTINUED] levothyroxine (SYNTHROID, LEVOTHROID) 50 MCG tablet Take 50 mcg by mouth daily.      . [DISCONTINUED] predniSONE (STERAPRED UNI-PAK) 5 MG TABS        No facility-administered encounter medications on file as of 05/10/2012.    Review of Systems  Constitutional: Negative.   HENT: Negative.   Eyes: Negative.   Respiratory: Negative.   Cardiovascular: Negative.   Gastrointestinal: Negative.   Musculoskeletal: Positive for back pain.  Skin: Negative.   Neurological: Negative.   Psychiatric/Behavioral: Negative.   All other systems reviewed and are negative.    BP 140/82  Pulse 69  Ht 5\' 2"  (1.575 m)  Wt 131 lb 4 oz (59.535 kg)  BMI 24 kg/m2  Physical Exam  Nursing note and vitals reviewed. Constitutional: She is oriented to person, place, and time. She appears well-developed and well-nourished.  HENT:  Head: Normocephalic.  Nose: Nose normal.  Mouth/Throat: Oropharynx is clear and moist.  Eyes: Conjunctivae are normal. Pupils are equal, round, and reactive to light.  Neck: Normal range of motion. Neck supple. No JVD present.  Cardiovascular: Normal rate, regular rhythm, S1 normal, S2 normal, normal heart sounds and intact distal pulses.  Exam reveals no gallop and no friction rub.   No murmur heard. Pulmonary/Chest: Effort normal and breath sounds normal. No respiratory distress. She has no wheezes. She has no rales. She exhibits no tenderness.  Abdominal: Soft. Bowel sounds are normal. She exhibits no distension. There is no tenderness.  Musculoskeletal: Normal range of motion. She exhibits no edema and no tenderness.  Lymphadenopathy:    She has no  cervical adenopathy.  Neurological: She is alert and oriented to person, place, and time. Coordination normal.  Skin: Skin is warm and dry. No rash noted. No erythema.  Psychiatric: She has a normal mood and affect. Her behavior is normal. Judgment and thought content normal.    Assessment and Plan

## 2012-05-10 NOTE — Assessment & Plan Note (Signed)
Currently with no symptoms of angina. No further workup at this time. Continue current medication regimen. 

## 2012-05-20 ENCOUNTER — Telehealth: Payer: Self-pay | Admitting: *Deleted

## 2012-05-20 NOTE — Telephone Encounter (Signed)
Pt daughter calling stating that pt is having some ankle swelling. Concerned b/c pt had cabg "Several' years ago

## 2012-05-20 NOTE — Telephone Encounter (Signed)
lmtcb

## 2012-05-20 NOTE — Telephone Encounter (Signed)
Dtr says pt has developed new LE edema in both feet over the past week She has started no new medications Denies sob or any other symptoms dtr says there is s cut behind right ankle that has been there x a few days, she says it is very red "all around it" I had her hold while I discussed with Dr. Mariah Milling  Per Dr. Mariah Milling, "Have pt elevated legs through w/e and try compression stockings.  It may be recent cortisone shot causing fluid retention. Have pt f/u with PCP re: cut" VO Dr. Alvis Lemmings, RN  Dtr was informed Pt declines compression stockings Will try to keep legs elevated over w/e  i will call back Monday to reassess

## 2012-05-23 ENCOUNTER — Telehealth: Payer: Self-pay

## 2012-05-23 NOTE — Telephone Encounter (Signed)
Per dtr pt's LE edema are "much better" Also reports small cut on back of leg is healing They will call if edema gets worse

## 2012-05-23 NOTE — Telephone Encounter (Signed)
Assess edema 

## 2012-05-23 NOTE — Telephone Encounter (Signed)
Message copied by [facility], Gizella Belleville E on Mon May 23, 2012  8:11 AM ------      Message from: [facility], Whyatt Klinger E      Created: Fri May 20, 2012  3:24 PM      Regarding: edema       Assess edema ------

## 2012-07-19 ENCOUNTER — Telehealth: Payer: Self-pay

## 2012-07-19 NOTE — Telephone Encounter (Signed)
Call pt to remind her due for L/L with Dr. [name]

## 2012-07-19 NOTE — Telephone Encounter (Signed)
Requested labs from Dr. [name]

## 2012-07-19 NOTE — Telephone Encounter (Signed)
Per pt, she had these drawn 07/15/12 Will call to get result

## 2012-09-30 ENCOUNTER — Encounter: Payer: Self-pay | Admitting: Cardiovascular Disease

## 2012-09-30 ENCOUNTER — Ambulatory Visit (INDEPENDENT_AMBULATORY_CARE_PROVIDER_SITE_OTHER): Payer: Medicare PPO | Admitting: Cardiovascular Disease

## 2012-09-30 VITALS — BP 142/80 | HR 68 | Ht 62.5 in | Wt 133.2 lb

## 2012-09-30 DIAGNOSIS — R609 Edema, unspecified: Secondary | ICD-10-CM | POA: Insufficient documentation

## 2012-09-30 DIAGNOSIS — I10 Essential (primary) hypertension: Secondary | ICD-10-CM

## 2012-09-30 DIAGNOSIS — I25810 Atherosclerosis of coronary artery bypass graft(s) without angina pectoris: Secondary | ICD-10-CM

## 2012-09-30 DIAGNOSIS — E785 Hyperlipidemia, unspecified: Secondary | ICD-10-CM

## 2012-09-30 DIAGNOSIS — M25473 Effusion, unspecified ankle: Secondary | ICD-10-CM | POA: Insufficient documentation

## 2012-09-30 MED ORDER — FUROSEMIDE 20 MG PO TABS: 20.0000 mg | ORAL_TABLET | Freq: Every day | ORAL | Status: DC

## 2012-09-30 MED ORDER — POTASSIUM CHLORIDE ER 10 MEQ PO TBCR: 10.0000 meq | EXTENDED_RELEASE_TABLET | Freq: Every day | ORAL | Status: DC

## 2012-09-30 NOTE — Assessment & Plan Note (Signed)
Cholesterol is very high. She reports that she is taking simvastatin 40 mg daily. Would recommend repeat at the end of 2014. If he continues to run more than 200, goal is actually 150, may need to change medications and add zetia with Crestor.

## 2012-09-30 NOTE — Assessment & Plan Note (Signed)
Pitting edema of the right greater than left ankle. Etiology not clear. HCTZ did not help. Worse today. Suggested she try several days of Lasix 20 mg in the morning with potassium 10 mEq. If no improvement, this is likely from venous insufficiency. Also recommended TED hose or tight compression sock, with leg elevation. Recommended that she not take Lasix daily, only as needed.

## 2012-09-30 NOTE — Progress Notes (Signed)
Patient ID: [name], female    DOB: 1922-04-28, 79 y.o.   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 She has a history of diverticular disease and significant GI bleeding.  Previous history of falls. Recently received cortisone shots to her back and hips with significant improvement of her pain.  Her husband died In 08-Jul-2011, adjusting better to being on. He died of complications while in the hospital.  She is otherwise active, denies any significant shortness of breath or chest pain.   She reports 2 months of worsening swelling of her right greater than left lower extremities. Swelling particularly around the ankles. No change in habits, no recent travel him and no pain in her legs. Swelling is pitting. Denies having swelling before. No other family members with swelling. Does drink significant fluids in the daytime but no change from before. Uncertain if there is improvement by the morning with legs up.  Cholesterol in 05-29-14was 212, up from 170 in 07-08-11.  EKG shows normal sinus rhythm with a rate of 68 beats per minute, no significant ST or T wave changes    Outpatient Encounter Prescriptions as of 09/30/2012  Medication Sig Dispense Refill  . ALPRAZolam (XANAX) 1 MG tablet Take 1 mg by mouth 2 (two) times daily as needed. For anxiety      . aspirin 81 MG EC tablet Take 81 mg by mouth daily.        . calcium-vitamin D (OSCAL WITH D) 500-200 MG-UNIT per tablet Take 1 tablet by mouth daily.        . clotrimazole-betamethasone (LOTRISONE) cream Apply 1 application topically as needed.       . folic acid (FOLVITE) 1 MG tablet Take 1 mg by mouth daily.      Marland Kitchen levothyroxine (SYNTHROID, LEVOTHROID) 75 MCG tablet Take 75 mcg by mouth daily.      . metoprolol succinate (TOPROL-XL) 100 MG 24 hr tablet Take 1 tablet (100 mg total) by mouth daily.  15 tablet  3  . Multiple Vitamin (MULTIVITAMIN) tablet Take 1 tablet by mouth daily.        . nitroGLYCERIN (NITROSTAT) 0.4 MG SL tablet Place 1 tablet (0.4 mg total) under the tongue every 5 (five) minutes as needed.  25 tablet  6  . omeprazole (PRILOSEC) 20 MG capsule Take 20 mg by mouth daily.      . simvastatin (ZOCOR) 40 MG tablet Take 40 mg by mouth at bedtime.          Review of Systems  Constitutional: Negative.   HENT: Negative.   Eyes: Negative.   Respiratory: Negative.   Cardiovascular: Positive for leg swelling.  Gastrointestinal: Negative.   Musculoskeletal: Positive for back pain.  Skin: Negative.   Neurological: Negative.   Psychiatric/Behavioral: Negative.   All other systems reviewed and are negative.    BP 142/80  Pulse 68  Ht 5' 2.5" (1.588 m)  Wt 133 lb 4 oz (60.442 kg)  BMI 23.97 kg/m2  Physical Exam  Nursing note and vitals reviewed. Constitutional: She is oriented to person, place, and time. She appears well-developed and  well-nourished.  HENT:  Head: Normocephalic.  Nose: Nose normal.  Mouth/Throat: Oropharynx is clear and moist.  Eyes: Conjunctivae are normal. Pupils are equal, round, and reactive to light.  Neck: Normal range of motion. Neck supple. No JVD present.  Cardiovascular: Normal rate, regular rhythm, S1 normal, S2 normal, normal heart sounds and intact distal pulses.  Exam reveals no gallop and no friction rub.   No murmur heard. Pulmonary/Chest: Effort normal and breath sounds normal. No respiratory distress. She has no wheezes. She has no rales. She exhibits no tenderness.  Abdominal: Soft. Bowel sounds are normal. She exhibits no distension. There is no tenderness.  Musculoskeletal:  Normal range of motion. She exhibits no edema and no tenderness.  Lymphadenopathy:    She has no cervical adenopathy.  Neurological: She is alert and oriented to person, place, and time. Coordination normal.  Skin: Skin is warm and dry. No rash noted. No erythema.  Psychiatric: She has a normal mood and affect. Her behavior is normal. Judgment and thought content normal.    Assessment and Plan

## 2012-09-30 NOTE — Assessment & Plan Note (Signed)
Blood pressure is well controlled on today's visit. No changes made to the medications. 

## 2012-09-30 NOTE — Patient Instructions (Addendum)
Try a few days of lasix/furosemide one a day in the morning for a few days with potassium  Shop for compression hose with a zipper on [employer]  Please call us if you have new issues that need to be addressed before your next appt.  Your physician wants you to follow-up in: 1 month.

## 2012-09-30 NOTE — Assessment & Plan Note (Signed)
Currently with no symptoms of angina. No further workup at this time. Continue current medication regimen. 

## 2012-10-10 ENCOUNTER — Inpatient Hospital Stay: Payer: Self-pay | Admitting: Internal Medicine

## 2012-10-10 LAB — BASIC METABOLIC PANEL
BUN: 24 mg/dL — ABNORMAL HIGH (ref 7–18)
Co2: 27 mmol/L (ref 21–32)
Creatinine: 1.18 mg/dL (ref 0.60–1.30)
EGFR (African American): 48 — ABNORMAL LOW
Glucose: 111 mg/dL — ABNORMAL HIGH (ref 65–99)
Osmolality: 279 (ref 275–301)
Potassium: 4.1 mmol/L (ref 3.5–5.1)
Sodium: 137 mmol/L (ref 136–145)

## 2012-10-10 LAB — URINALYSIS, COMPLETE
Bilirubin,UR: NEGATIVE
Blood: NEGATIVE
Glucose,UR: NEGATIVE mg/dL (ref 0–75)
Ketone: NEGATIVE
Nitrite: NEGATIVE
Protein: NEGATIVE
RBC,UR: <1 /HPF (ref 0–5)
Specific Gravity: 1.006 (ref 1.003–1.030)
Squamous Epithelial: 1
WBC UR: NONE SEEN /HPF (ref 0–5)

## 2012-10-10 LAB — CBC
MCH: 32.1 pg (ref 26.0–34.0)
MCHC: 34.3 g/dL (ref 32.0–36.0)
MCV: 94 fL (ref 80–100)
WBC: 17.8 10*3/uL — ABNORMAL HIGH (ref 3.6–11.0)

## 2012-10-10 LAB — TROPONIN I: Troponin-I: <0.02 ng/mL

## 2012-10-11 LAB — BASIC METABOLIC PANEL
Anion Gap: 6 — ABNORMAL LOW (ref 7–16)
Chloride: 109 mmol/L — ABNORMAL HIGH (ref 98–107)
Co2: 26 mmol/L (ref 21–32)
Creatinine: 1.1 mg/dL (ref 0.60–1.30)
EGFR (African American): 52 — ABNORMAL LOW
Osmolality: 283 (ref 275–301)
Potassium: 3.6 mmol/L (ref 3.5–5.1)
Sodium: 141 mmol/L (ref 136–145)

## 2012-10-11 LAB — CBC WITH DIFFERENTIAL/PLATELET
Basophil #: 0 10*3/uL (ref 0.0–0.1)
Basophil %: 0.3 %
Eosinophil %: 0.4 %
Lymphocyte #: 1.2 10*3/uL (ref 1.0–3.6)
Lymphocyte %: 12.8 %
MCH: 32.7 pg (ref 26.0–34.0)
MCHC: 34.7 g/dL (ref 32.0–36.0)
MCV: 94 fL (ref 80–100)
Monocyte #: 0.7 x10 3/mm (ref 0.2–0.9)
Monocyte %: 7.2 %
RBC: 3.34 10*6/uL — ABNORMAL LOW (ref 3.80–5.20)
RDW: 14.6 % — ABNORMAL HIGH (ref 11.5–14.5)
WBC: 9.3 10*3/uL (ref 3.6–11.0)

## 2012-10-11 LAB — TSH: Thyroid Stimulating Horm: 0.06 u[IU]/mL — ABNORMAL LOW

## 2012-10-12 LAB — CBC WITH DIFFERENTIAL/PLATELET
Basophil #: 0 10*3/uL (ref 0.0–0.1)
Eosinophil %: 1.1 %
HCT: 30.1 % — ABNORMAL LOW (ref 35.0–47.0)
HGB: 10.6 g/dL — ABNORMAL LOW (ref 12.0–16.0)
Lymphocyte %: 14.1 %
MCH: 33 pg (ref 26.0–34.0)
MCHC: 35.3 g/dL (ref 32.0–36.0)
MCV: 94 fL (ref 80–100)
Neutrophil %: 76 %
Platelet: 146 10*3/uL — ABNORMAL LOW (ref 150–440)
RDW: 14.2 % (ref 11.5–14.5)
WBC: 6.5 10*3/uL (ref 3.6–11.0)

## 2012-10-12 LAB — BASIC METABOLIC PANEL
Anion Gap: 9 (ref 7–16)
Calcium, Total: 8.8 mg/dL (ref 8.5–10.1)
Chloride: 114 mmol/L — ABNORMAL HIGH (ref 98–107)
Creatinine: 0.99 mg/dL (ref 0.60–1.30)
EGFR (Non-African Amer.): 51 — ABNORMAL LOW
Glucose: 100 mg/dL — ABNORMAL HIGH (ref 65–99)
Osmolality: 288 (ref 275–301)

## 2012-10-15 LAB — CULTURE, BLOOD (SINGLE)

## 2012-10-16 ENCOUNTER — Emergency Department: Payer: Self-pay | Admitting: Emergency Medicine

## 2012-10-16 LAB — CBC
HGB: 11.8 g/dL — ABNORMAL LOW (ref 12.0–16.0)
MCH: 32.9 pg (ref 26.0–34.0)
Platelet: 169 10*3/uL (ref 150–440)
RBC: 3.6 10*6/uL — ABNORMAL LOW (ref 3.80–5.20)

## 2012-10-16 LAB — COMPREHENSIVE METABOLIC PANEL
Albumin: 3.3 g/dL — ABNORMAL LOW (ref 3.4–5.0)
Alkaline Phosphatase: 44 U/L — ABNORMAL LOW (ref 50–136)
Bilirubin,Total: 0.5 mg/dL (ref 0.2–1.0)
Calcium, Total: 9 mg/dL (ref 8.5–10.1)
Chloride: 106 mmol/L (ref 98–107)
Co2: 24 mmol/L (ref 21–32)
Creatinine: 1.1 mg/dL (ref 0.60–1.30)
EGFR (African American): 52 — ABNORMAL LOW
EGFR (Non-African Amer.): 45 — ABNORMAL LOW
Glucose: 98 mg/dL (ref 65–99)
Osmolality: 277 (ref 275–301)
SGOT(AST): 28 U/L (ref 15–37)
Sodium: 137 mmol/L (ref 136–145)

## 2012-10-16 LAB — URINALYSIS, COMPLETE
Bacteria: NONE SEEN
Glucose,UR: NEGATIVE mg/dL (ref 0–75)
Ketone: NEGATIVE
Nitrite: NEGATIVE
Ph: 6 (ref 4.5–8.0)
Squamous Epithelial: 1

## 2012-10-16 LAB — TROPONIN I: Troponin-I: <0.02 ng/mL

## 2012-10-16 LAB — CK TOTAL AND CKMB (NOT AT ARMC)
CK, Total: 63 U/L (ref 21–215)
CK-MB: 2.1 ng/mL (ref 0.5–3.6)

## 2012-10-31 ENCOUNTER — Encounter: Payer: Self-pay | Admitting: Cardiovascular Disease

## 2012-10-31 ENCOUNTER — Ambulatory Visit (INDEPENDENT_AMBULATORY_CARE_PROVIDER_SITE_OTHER): Payer: Medicare PPO | Admitting: Cardiovascular Disease

## 2012-10-31 VITALS — BP 150/80 | HR 64 | Ht 63.0 in | Wt 134.5 lb

## 2012-10-31 DIAGNOSIS — I10 Essential (primary) hypertension: Secondary | ICD-10-CM

## 2012-10-31 DIAGNOSIS — R0602 Shortness of breath: Secondary | ICD-10-CM

## 2012-10-31 DIAGNOSIS — I25810 Atherosclerosis of coronary artery bypass graft(s) without angina pectoris: Secondary | ICD-10-CM

## 2012-10-31 DIAGNOSIS — E785 Hyperlipidemia, unspecified: Secondary | ICD-10-CM

## 2012-10-31 DIAGNOSIS — R600 Localized edema: Secondary | ICD-10-CM

## 2012-10-31 DIAGNOSIS — R609 Edema, unspecified: Secondary | ICD-10-CM

## 2012-10-31 DIAGNOSIS — M25473 Effusion, unspecified ankle: Secondary | ICD-10-CM

## 2012-10-31 MED ORDER — FUROSEMIDE 20 MG PO TABS: 20.0000 mg | ORAL_TABLET | Freq: Every day | ORAL | Status: DC | PRN

## 2012-10-31 NOTE — Assessment & Plan Note (Signed)
Currently not taking her simvastatin. This was held in the hope her leg pain was improved. It has not improved without the statin. She will continue to hold it for now.

## 2012-10-31 NOTE — Assessment & Plan Note (Signed)
Daughter presents with her today. She reports the patient has shortness of breath with exertion. Echocardiogram pending.

## 2012-10-31 NOTE — Assessment & Plan Note (Addendum)
Echocardiogram pending to evaluate fluid status. Suggested she could try Lasix when necessary for worsening ankle edema, in addition to compression hose and leg elevation. Suspect she has component of venous insufficiency. Symptoms have improved with compression hose and look normal on today's visit.

## 2012-10-31 NOTE — Progress Notes (Signed)
Patient ID: [name], female    DOB: [birth year], 79 y.o.   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 She has a history of diverticular disease and significant GI bleeding.  Previous history of falls. Previous cortisone shots to her back and hips with significant improvement of her pain.  Her husband died In 07-01-2011. He died of complications while in the hospital.   She reports having recent pneumonia. Worsening leg pain bilaterally below the knees, worse at nighttime, mild in the daytime. Recent admission for pneumonia 2 weeks ago. She received IV fluids. Since then, leg swelling has been worse. Ultrasound of her legs rule out DVT. Seen by Dr. Wyn Quaker for PAD, had normal ABIs. She reports that she is scheduled for vein mapping in several weeks' time. Leg edema has improved with compression hose, now back to normal. She drinks significant fluids in the daytime. On her last clinic visit several months ago, she had right greater than left lower extremity edema around the ankles. She was given Lasix to take when necessary. She has not been taking this. She recently received prednisone, now on oxycodone for leg pain. Discomfort in her arms has disappeared, continues to have leg pain.  Cholesterol in 22-May-2014was 212, up from 170 in 01-Jul-2011.  EKG shows normal sinus rhythm with a rate of 64 beats per minute, no significant ST or T wave changes   Outpatient Encounter Prescriptions as of 10/31/2012  Medication Sig Dispense Refill  . ALPRAZolam (XANAX) 1 MG tablet Take 1 mg by mouth 2 (two) times daily as  needed. For anxiety      . aspirin 81 MG EC tablet Take 81 mg by mouth daily.        . calcium-vitamin D (OSCAL WITH D) 500-200 MG-UNIT per tablet Take 1 tablet by mouth daily.        . clotrimazole-betamethasone (LOTRISONE) cream Apply 1 application topically as needed.      . folic acid (FOLVITE) 1 MG tablet Take 1 mg by mouth daily.      Marland Kitchen levothyroxine (SYNTHROID, LEVOTHROID) 75 MCG tablet Take 75 mcg by mouth daily.      . metoprolol succinate (TOPROL-XL) 100 MG 24 hr tablet Take 1 tablet (100 mg total) by mouth daily.  15 tablet  3  . Multiple Vitamin (MULTIVITAMIN) tablet Take 1 tablet by mouth daily.        . nitroGLYCERIN (NITROSTAT) 0.4 MG SL tablet Place 1 tablet (0.4 mg total) under the tongue every 5 (five) minutes as needed.  25 tablet  6  . omeprazole (PRILOSEC) 20 MG capsule Take 20 mg by mouth daily.      Marland Kitchen oxyCODONE-acetaminophen (PERCOCET/ROXICET) 5-325 MG per tablet Take 1 tablet by mouth as needed.       . simvastatin (ZOCOR) 40 MG tablet Take 40 mg by mouth at bedtime.        . furosemide (LASIX) 20 MG tablet Take 1 tablet (20 mg total) by mouth daily as needed.  30 tablet  3    Review of Systems  Constitutional: Negative.   HENT: Negative.  Eyes: Negative.   Respiratory: Negative.   Cardiovascular: Positive for leg swelling.  Gastrointestinal: Negative.   Musculoskeletal: Positive for back pain.  Skin: Negative.   Neurological: Negative.   Psychiatric/Behavioral: Negative.   All other systems reviewed and are negative.    BP 150/80  Pulse 64  Ht 5\' 3"  (1.6 m)  Wt 134 lb 8 oz (61.009 kg)  BMI 23.83 kg/m2  Physical Exam  Nursing note and vitals reviewed. Constitutional: She is oriented to person, place, and time. She appears well-developed and well-nourished.  HENT:  Head: Normocephalic.  Nose: Nose normal.  Mouth/Throat: Oropharynx is clear and moist.  Eyes: Conjunctivae are normal. Pupils are equal, round, and reactive to light.  Neck: Normal range  of motion. Neck supple. No JVD present.  Cardiovascular: Normal rate, regular rhythm, S1 normal, S2 normal, normal heart sounds and intact distal pulses.  Exam reveals no gallop and no friction rub.   No murmur heard. Pulmonary/Chest: Effort normal and breath sounds normal. No respiratory distress. She has no wheezes. She has no rales. She exhibits no tenderness.  Abdominal: Soft. Bowel sounds are normal. She exhibits no distension. There is no tenderness.  Musculoskeletal: Normal range of motion. She exhibits no edema and no tenderness.  Lymphadenopathy:    She has no cervical adenopathy.  Neurological: She is alert and oriented to person, place, and time. Coordination normal.  Skin: Skin is warm and dry. No rash noted. No erythema.  Psychiatric: She has a normal mood and affect. Her behavior is normal. Judgment and thought content normal.    Assessment and Plan

## 2012-10-31 NOTE — Assessment & Plan Note (Signed)
Blood pressure is well controlled on today's visit. No changes made to the medications. 

## 2012-10-31 NOTE — Patient Instructions (Addendum)
You are doing well.  Ask Dr. [name] about cymbalta for leg pain  Take lasix with potassium only when your ankle swelling comes back  We will check an echocardiogram  Please call us if you have new issues that need to be addressed before your next appt.  Your physician wants you to follow-up in: 3 months.  You will receive a reminder letter in the mail two months in advance. If you don't receive a letter, please call our office to schedule the follow-up appointment.

## 2012-10-31 NOTE — Assessment & Plan Note (Signed)
Currently with no symptoms of angina.  Continue current medication regimen. Repeat echocardiogram ordered given leg edema, shortness of breath at times.

## 2012-11-01 ENCOUNTER — Other Ambulatory Visit: Payer: Self-pay

## 2012-11-01 ENCOUNTER — Other Ambulatory Visit (INDEPENDENT_AMBULATORY_CARE_PROVIDER_SITE_OTHER): Payer: Medicare PPO

## 2012-11-01 ENCOUNTER — Telehealth: Payer: Self-pay

## 2012-11-01 DIAGNOSIS — I25810 Atherosclerosis of coronary artery bypass graft(s) without angina pectoris: Secondary | ICD-10-CM

## 2012-11-01 DIAGNOSIS — R600 Localized edema: Secondary | ICD-10-CM

## 2012-11-01 DIAGNOSIS — I10 Essential (primary) hypertension: Secondary | ICD-10-CM

## 2012-11-01 DIAGNOSIS — R609 Edema, unspecified: Secondary | ICD-10-CM

## 2012-11-01 DIAGNOSIS — R0602 Shortness of breath: Secondary | ICD-10-CM

## 2012-11-01 NOTE — Telephone Encounter (Signed)
Pt is coming in today to have echo, pt daughter called and states when we call with results, please call her. Daughter is on pt DPR.

## 2012-11-01 NOTE — Telephone Encounter (Signed)
Noted. Echo at 3:30 pm today.

## 2012-11-03 ENCOUNTER — Telehealth: Payer: Self-pay | Admitting: *Deleted

## 2012-11-03 NOTE — Telephone Encounter (Signed)
Previous encounter open

## 2012-11-03 NOTE — Telephone Encounter (Signed)
[name] at 11/03/2012 11:43 AM    Status: Signed                   [phone](home) Patient's daughter called wanting the echo results

## 2012-11-03 NOTE — Telephone Encounter (Signed)
[phone]([id number]) Patient's daughter called wanting the echo results

## 2012-11-03 NOTE — Telephone Encounter (Signed)
I left a message results not received yet. We will call back once this is reviewed by the doctor.

## 2012-11-04 ENCOUNTER — Telehealth: Payer: Self-pay

## 2012-11-04 NOTE — Telephone Encounter (Signed)
Message copied by Marilynne Halsted on Fri Nov 04, 2012 10:52 AM ------      Message from: Antonieta Iba      Created: Thu Nov 03, 2012  9:55 PM       Normal cardiac function, ef >55%      Mild regurg of several valves, not a serious issue ------

## 2012-11-04 NOTE — Telephone Encounter (Signed)
Spoke w/ daughter.  She is aware of results.

## 2012-11-08 ENCOUNTER — Ambulatory Visit: Payer: Medicare PPO | Admitting: Cardiovascular Disease

## 2012-12-20 ENCOUNTER — Telehealth: Payer: Self-pay

## 2012-12-20 NOTE — Telephone Encounter (Signed)
Pt has a question about the fluid pill that was prescribed for her. Please call

## 2012-12-20 NOTE — Telephone Encounter (Signed)
Spoke w/ pt.  She states that she is still having ankle edema and wants to know if she can increase her lasix. Pt reports that she is not weighing daily, she tries to keep her feet elevated, and does not wear compression hose, as she doesn't like to wrestle with them to get them on. Instructed pt to try the TED hose, see about purchasing a zippered pair, try this for a few days, weigh daily and call me to see if symptoms have resolved.

## 2012-12-28 LAB — COMPREHENSIVE METABOLIC PANEL
Albumin: 3.9 g/dL (ref 3.4–5.0)
Bilirubin,Total: 0.5 mg/dL (ref 0.2–1.0)
Calcium, Total: 10.8 mg/dL — ABNORMAL HIGH (ref 8.5–10.1)
Chloride: 106 mmol/L (ref 98–107)
EGFR (African American): 55 — ABNORMAL LOW
EGFR (Non-African Amer.): 47 — ABNORMAL LOW
Osmolality: 285 (ref 275–301)
Total Protein: 7 g/dL (ref 6.4–8.2)

## 2012-12-28 LAB — CBC WITH DIFFERENTIAL/PLATELET
Basophil #: 0.1 10*3/uL (ref 0.0–0.1)
Basophil %: 0.9 %
HGB: 13.3 g/dL (ref 12.0–16.0)
MCV: 92 fL (ref 80–100)
Neutrophil %: 73.8 %
RBC: 4.2 10*6/uL (ref 3.80–5.20)
RDW: 14.4 % (ref 11.5–14.5)
WBC: 9.2 10*3/uL (ref 3.6–11.0)

## 2012-12-28 LAB — PRO B NATRIURETIC PEPTIDE: B-Type Natriuretic Peptide: 628 pg/mL — ABNORMAL HIGH (ref 0–450)

## 2012-12-29 ENCOUNTER — Observation Stay: Payer: Self-pay | Admitting: Internal Medicine

## 2012-12-29 ENCOUNTER — Encounter: Payer: Self-pay | Admitting: Physician Assistant

## 2012-12-29 DIAGNOSIS — I700 Atherosclerosis of aorta: Secondary | ICD-10-CM

## 2012-12-29 DIAGNOSIS — R079 Chest pain, unspecified: Secondary | ICD-10-CM

## 2012-12-29 DIAGNOSIS — I2510 Atherosclerotic heart disease of native coronary artery without angina pectoris: Secondary | ICD-10-CM

## 2012-12-29 DIAGNOSIS — E785 Hyperlipidemia, unspecified: Secondary | ICD-10-CM

## 2012-12-29 LAB — CK TOTAL AND CKMB (NOT AT ARMC)
CK, Total: 54 U/L (ref 21–215)
CK-MB: 1.9 ng/mL (ref 0.5–3.6)

## 2012-12-29 LAB — CK-MB: CK-MB: 2.5 ng/mL (ref 0.5–3.6)

## 2012-12-29 LAB — TROPONIN I: Troponin-I: <0.02 ng/mL

## 2012-12-30 LAB — BASIC METABOLIC PANEL
Anion Gap: 5 — ABNORMAL LOW (ref 7–16)
Calcium, Total: 9.5 mg/dL (ref 8.5–10.1)
Chloride: 107 mmol/L (ref 98–107)
Creatinine: 1.1 mg/dL (ref 0.60–1.30)
EGFR (African American): 52 — ABNORMAL LOW
Glucose: 97 mg/dL (ref 65–99)
Osmolality: 274 (ref 275–301)
Sodium: 137 mmol/L (ref 136–145)

## 2012-12-30 LAB — LIPID PANEL
Cholesterol: 144 mg/dL (ref 0–200)
Ldl Cholesterol, Calc: 60 mg/dL (ref 0–100)

## 2013-01-18 ENCOUNTER — Encounter: Payer: Self-pay | Admitting: Surgery

## 2013-01-22 LAB — WOUND AEROBIC CULTURE

## 2013-03-01 ENCOUNTER — Encounter: Payer: Self-pay | Admitting: Surgery

## 2013-03-06 ENCOUNTER — Ambulatory Visit (INDEPENDENT_AMBULATORY_CARE_PROVIDER_SITE_OTHER): Payer: Medicare HMO | Admitting: Cardiovascular Disease

## 2013-03-06 ENCOUNTER — Encounter: Payer: Self-pay | Admitting: Cardiovascular Disease

## 2013-03-06 VITALS — BP 108/64 | HR 72 | Ht 63.0 in | Wt 129.8 lb

## 2013-03-06 DIAGNOSIS — E785 Hyperlipidemia, unspecified: Secondary | ICD-10-CM

## 2013-03-06 DIAGNOSIS — R079 Chest pain, unspecified: Secondary | ICD-10-CM

## 2013-03-06 DIAGNOSIS — I10 Essential (primary) hypertension: Secondary | ICD-10-CM

## 2013-03-06 DIAGNOSIS — I25810 Atherosclerosis of coronary artery bypass graft(s) without angina pectoris: Secondary | ICD-10-CM

## 2013-03-06 NOTE — Assessment & Plan Note (Signed)
Prior cholesterol 250, likely offer her simvastatin. We'll wait to see her followup total cholesterol. Goal LDL less than 70. May need high-dose Lipitor or Crestor to achieve goal

## 2013-03-06 NOTE — Assessment & Plan Note (Signed)
Episode of chest pain November 2014, likely from GI etiology. Cardiac workup negative

## 2013-03-06 NOTE — Assessment & Plan Note (Signed)
Currently with no symptoms of angina. No further workup at this time. Continue current medication regimen. Stress test November 2014 showing no ischemia

## 2013-03-06 NOTE — Patient Instructions (Signed)
You are doing well. No medication changes were made.  Please call us if you have new issues that need to be addressed before your next appt.  Your physician wants you to follow-up in: 6 months.  You will receive a reminder letter in the mail two months in advance. If you don't receive a letter, please call our office to schedule the follow-up appointment.   

## 2013-03-06 NOTE — Progress Notes (Signed)
Patient ID: [name], female    DOB: [birth year], 80 y.o.   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 She has a history of diverticular disease and significant GI bleeding.  Previous history of falls. Previous cortisone shots to her back and hips with significant improvement of her pain.  Her husband died In 07/10/2011. He died of complications while in the hospital.   In followup today, she reports having episode of chest pain in November 2014. She was evaluated in the hospital, ruled out for MI, had a stress test that showed no ischemia on 12/30/2012. She was started on sucralfate and symptoms have resolved. Overall she is very happy, has no complaints. She does report having occasional falls, recent fall with trauma to her right knee. She tries to do exercise on daily basis, even if that means walking with her walker around her house. She is fiercely independent  Cholesterol higher at the end of 09-Jul-2012 when she was off her simvastatin. Total cholesterol that time 250. No new labs available since that time  Previous lower extremity edema improved in followup today. Reports blood pressure at home typically in the 130-140 range  Cholesterol in May 31, 2014was 212, up from 170 in 07/10/2011.  EKG shows normal sinus rhythm with a rate of 72 beats per minute, no significant ST or T wave changes   Outpatient Encounter Prescriptions as of 03/06/2013  Medication Sig  . ALPRAZolam (XANAX) 1 MG tablet Take 1 mg by mouth 2 (two) times daily as needed. For anxiety  . amLODipine (NORVASC) 2.5 MG tablet Take 2.5 mg by  mouth daily.  Marland Kitchen aspirin 81 MG EC tablet Take 81 mg by mouth daily.    . calcium-vitamin D (OSCAL WITH D) 500-200 MG-UNIT per tablet Take 1 tablet by mouth daily.    . clotrimazole-betamethasone (LOTRISONE) cream Apply 1 application topically as needed.  . folic acid (FOLVITE) 800 MCG tablet Take 400 mcg by mouth daily.  . hydrochlorothiazide (HYDRODIURIL) 25 MG tablet Take 25 mg by mouth daily.   Marland Kitchen KLOR-CON M20 20 MEQ tablet Take 20 mEq by mouth daily.   Marland Kitchen levothyroxine (SYNTHROID, LEVOTHROID) 75 MCG tablet Take 75 mcg by mouth daily.  . metoprolol succinate (TOPROL-XL) 50 MG 24 hr tablet Take 25 mg by mouth daily. Take with or immediately following a meal.  . nitroGLYCERIN (NITROSTAT) 0.4 MG SL tablet Place 1 tablet (0.4 mg total) under the tongue every 5 (five) minutes as needed.  Marland Kitchen omeprazole (PRILOSEC) 20 MG capsule Take 20 mg by mouth daily.  . simvastatin (ZOCOR) 40 MG tablet Take 40 mg by mouth at bedtime.    . sucralfate (CARAFATE) 1 G tablet Take 1 g by mouth 2 (two) times daily.      Review of Systems  Constitutional: Negative.   HENT: Negative.   Eyes: Negative.   Respiratory: Negative.   Gastrointestinal: Negative.   Musculoskeletal: Positive for back pain and gait problem.  Skin: Negative.   Neurological: Negative.   Psychiatric/Behavioral: Negative.   All other systems reviewed and are negative.    BP 108/64  Pulse 72  Ht 5\' 3"  (1.6 m)  Wt 129 lb 12 oz (58.854 kg)  BMI 22.99 kg/m2  Physical Exam  Nursing note and vitals reviewed. Constitutional: She is oriented to person, place, and time. She appears well-developed and well-nourished.  HENT:  Head: Normocephalic.  Nose: Nose normal.  Mouth/Throat: Oropharynx is clear and moist.  Eyes: Conjunctivae are normal. Pupils are equal, round, and reactive to light.  Neck: Normal range of motion. Neck supple. No JVD present.  Cardiovascular: Normal rate, regular rhythm, S1 normal, S2 normal, normal heart sounds and  intact distal pulses.  Exam reveals no gallop and no friction rub.   No murmur heard. Pulmonary/Chest: Effort normal and breath sounds normal. No respiratory distress. She has no wheezes. She has no rales. She exhibits no tenderness.  Abdominal: Soft. Bowel sounds are normal. She exhibits no distension. There is no tenderness.  Musculoskeletal: Normal range of motion. She exhibits no edema and no tenderness.  Lymphadenopathy:    She has no cervical adenopathy.  Neurological: She is alert and oriented to person, place, and time. Coordination normal.  Skin: Skin is warm and dry. No rash noted. No erythema.  Psychiatric: She has a normal mood and affect. Her behavior is normal. Judgment and thought content normal.    Assessment and Plan

## 2013-03-06 NOTE — Assessment & Plan Note (Signed)
Blood pressure is well controlled on today's visit. No changes made to the medications. 

## 2013-03-12 ENCOUNTER — Encounter: Payer: Self-pay | Admitting: Surgery

## 2013-04-21 ENCOUNTER — Ambulatory Visit (INDEPENDENT_AMBULATORY_CARE_PROVIDER_SITE_OTHER): Payer: Commercial Managed Care - HMO | Admitting: Podiatry

## 2013-04-21 ENCOUNTER — Encounter: Payer: Self-pay | Admitting: Podiatry

## 2013-04-21 VITALS — BP 126/77 | HR 81 | Resp 16 | Ht 63.0 in | Wt 125.0 lb

## 2013-04-21 DIAGNOSIS — M79609 Pain in unspecified limb: Secondary | ICD-10-CM

## 2013-04-21 DIAGNOSIS — L600 Ingrowing nail: Secondary | ICD-10-CM

## 2013-04-21 DIAGNOSIS — B351 Tinea unguium: Secondary | ICD-10-CM

## 2013-04-21 NOTE — Progress Notes (Signed)
   Subjective:    Patient ID: [name], female    DOB: 01-07-1923, 80 y.o.   MRN: [id number]  HPI Comments: N pain L toenails 1 - 10  D 1 m O slowly C worse A shoes T pt puts new skin liquid on toenails, soaks in epson salt, has pedicures  Foot Pain      Review of Systems  Constitutional: Negative.   Eyes: Negative.   Respiratory: Negative.   Cardiovascular: Positive for leg swelling.  Gastrointestinal: Negative.   Endocrine: Negative.   Genitourinary: Negative.   Musculoskeletal:       Muscle pain  Skin:       Change in nails  Neurological: Negative.   Hematological: Bruises/bleeds easily.       Slow to heal  Psychiatric/Behavioral: Negative.        Objective:   Physical Exam        Assessment & Plan:

## 2013-04-21 NOTE — Progress Notes (Signed)
Subjective:     Patient ID: [name], female   DOB: Jun 23, 1922, 80 y.o.   MRN: [id number]  HPI patient presents with caregiver stating she is having a lot of trouble with her nailbeds and specifically her big toenails have become very tender over the last several months. States that she sees a vascular doctor who is checked for circulation which was okay   Review of Systems  All other systems reviewed and are negative.       Objective:   Physical Exam  Nursing note and vitals reviewed. Constitutional: She is oriented to person, place, and time.  Cardiovascular: Intact distal pulses.   Musculoskeletal: Normal range of motion.  Neurological: She is oriented to person, place, and time.  Skin: Skin is dry.   neurovascular status within normal limits of both feet with diminished range of motion and muscle strength noted of both feet. Well-developed arch with warmth in the toes and Fill time is immediate on both feet. Nail disease with thickness and pain 1-5 both feet with hallux nails been yell out in their discoloration    Assessment:     Mycotic nail infection 1-5 both feet with pain on all nails specifically that big ones been worse    Plan:     H&P performed and formula 3 dispensed with instructions. Debrided nailbeds 1-5 both feet with no bleeding noted and reappoint 3 months

## 2013-05-05 ENCOUNTER — Encounter: Payer: Self-pay | Admitting: Podiatry

## 2013-05-05 ENCOUNTER — Ambulatory Visit (INDEPENDENT_AMBULATORY_CARE_PROVIDER_SITE_OTHER): Payer: Commercial Managed Care - HMO

## 2013-05-05 ENCOUNTER — Ambulatory Visit (INDEPENDENT_AMBULATORY_CARE_PROVIDER_SITE_OTHER): Payer: Commercial Managed Care - HMO | Admitting: Podiatry

## 2013-05-05 VITALS — BP 122/67 | HR 80 | Resp 16

## 2013-05-05 DIAGNOSIS — L84 Corns and callosities: Secondary | ICD-10-CM

## 2013-05-05 DIAGNOSIS — M79609 Pain in unspecified limb: Secondary | ICD-10-CM

## 2013-05-05 DIAGNOSIS — M79676 Pain in unspecified toe(s): Secondary | ICD-10-CM

## 2013-05-05 DIAGNOSIS — M2040 Other hammer toe(s) (acquired), unspecified foot: Secondary | ICD-10-CM

## 2013-05-05 NOTE — Progress Notes (Signed)
Subjective:     Patient ID: [name], female   DOB: July 24, 1922, 80 y.o.   MRN: [id number]  HPI patient presents with caregiver stating that these fifth toes are really bothering me and I am getting corns on them and I'm having trouble wearing shoes   Review of Systems     Objective:   Physical Exam Neurovascular status unchanged with keratotic lesion distal fifth toe left and proximal fifth toe right that are painful when pressed    Assessment:     Hammertoe deformity with keratotic lesion formation bilateral fifth toe    Plan:     Debridement of lesions and padding after review of x-rays with patient. These could be surgically corrected but I would like to treat conservatively

## 2013-05-12 ENCOUNTER — Encounter: Payer: Self-pay | Admitting: Surgery

## 2013-05-26 ENCOUNTER — Encounter: Payer: Self-pay | Admitting: Podiatry

## 2013-05-26 ENCOUNTER — Ambulatory Visit (INDEPENDENT_AMBULATORY_CARE_PROVIDER_SITE_OTHER): Payer: Commercial Managed Care - HMO | Admitting: Podiatry

## 2013-05-26 ENCOUNTER — Ambulatory Visit: Payer: Commercial Managed Care - HMO | Admitting: Podiatry

## 2013-05-26 VITALS — BP 140/74 | HR 58 | Resp 16

## 2013-05-26 DIAGNOSIS — M2040 Other hammer toe(s) (acquired), unspecified foot: Secondary | ICD-10-CM

## 2013-05-28 NOTE — Progress Notes (Signed)
Subjective:     Patient ID: [name], female   DOB: January 26, 1923, 80 y.o.   MRN: [id number]  HPI patient presents with caregiver stating this left fifth toe is killing me and something skin have to be done. States that the debridement and medication have not helped at all and padding is not helping along with changes in shoe gear and open type shoes   Review of Systems     Objective:   Physical Exam Neurovascular status intact with digits found to be well-perfused and warm and keratotic lesion fifth toe left foot that is very painful when pressed    Assessment:     Hammertoe deformity fifth left that has not responded to conservative care at this time    Plan:     Reviewed condition with patient and caregiver. Due to the intense discomfort and the quality of life that this is causing her to have we discussed correction of the toe and reviewed risk associated with fixing this. Patient would like to have this fixed understanding risks and I allowed her to sign a consent form after extensive review and explained the procedure for arthroplasty fifth toe left foot. Patient is scheduled to have this done at the surgical center and is encouraged to call with any questions. She understands total recovery from this will take around 3-4 months

## 2013-05-29 ENCOUNTER — Encounter: Payer: Self-pay | Admitting: Podiatry

## 2013-05-29 NOTE — Progress Notes (Signed)
PATIENTS DAUGHTER CALLED AND CANCELLED HER SURGERY DUE TO A BAD FALL OVER THE WEEKEND

## 2013-06-06 ENCOUNTER — Encounter: Payer: Commercial Managed Care - HMO | Admitting: Podiatry

## 2013-06-09 ENCOUNTER — Encounter: Payer: Self-pay | Admitting: Surgery

## 2013-07-21 ENCOUNTER — Ambulatory Visit: Payer: Commercial Managed Care - HMO | Admitting: Podiatry

## 2013-08-09 DIAGNOSIS — F5104 Psychophysiologic insomnia: Secondary | ICD-10-CM | POA: Insufficient documentation

## 2013-09-05 ENCOUNTER — Ambulatory Visit: Payer: Medicare HMO | Admitting: Cardiovascular Disease

## 2013-09-06 ENCOUNTER — Encounter: Payer: Self-pay | Admitting: Cardiovascular Disease

## 2013-09-06 ENCOUNTER — Ambulatory Visit (INDEPENDENT_AMBULATORY_CARE_PROVIDER_SITE_OTHER): Payer: Medicare HMO | Admitting: Cardiovascular Disease

## 2013-09-06 VITALS — BP 124/80 | HR 62 | Ht 62.0 in | Wt 123.8 lb

## 2013-09-06 DIAGNOSIS — Z9181 History of falling: Secondary | ICD-10-CM

## 2013-09-06 DIAGNOSIS — R296 Repeated falls: Secondary | ICD-10-CM | POA: Insufficient documentation

## 2013-09-06 DIAGNOSIS — E785 Hyperlipidemia, unspecified: Secondary | ICD-10-CM

## 2013-09-06 DIAGNOSIS — M25473 Effusion, unspecified ankle: Secondary | ICD-10-CM

## 2013-09-06 DIAGNOSIS — I10 Essential (primary) hypertension: Secondary | ICD-10-CM

## 2013-09-06 DIAGNOSIS — I2510 Atherosclerotic heart disease of native coronary artery without angina pectoris: Secondary | ICD-10-CM

## 2013-09-06 DIAGNOSIS — R609 Edema, unspecified: Secondary | ICD-10-CM

## 2013-09-06 DIAGNOSIS — I25810 Atherosclerosis of coronary artery bypass graft(s) without angina pectoris: Secondary | ICD-10-CM

## 2013-09-06 NOTE — Progress Notes (Signed)
Patient ID: [name], female    DOB: 05/26/1922, 80 y.o.   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 She has a history of diverticular disease and significant GI bleeding.  Previous history of falls. She continues to have falls. She does not like to walk with her walker. She's not doing any regular exercise.. Family presents with her today and reports that physical therapy was ordered by primary care for in her house. They do not think that she could participate in an exercise class as she is too weak. She continues to struggle with ankle edema. Recently seen by Dr. Wyn Quaker for leg swelling. Was told she has lymphedema. Previous trauma to her right knee. She is fiercely independent She reports blood pressure at home is typically 130-140 systolic  Previous cortisone shots to her back and hips with significant improvement of her pain.  Her husband died In 06/27/11. He died of complications while in the hospital.    episode of chest pain in November 2014. She was evaluated in the hospital, ruled out for MI, had a stress test that showed no ischemia on 12/30/2012. She was started on sucralfate and symptoms have resolved.   Cholesterol higher at the end of 2012/06/26 when she was off her simvastatin. Total cholesterol that time 250.   Cholesterol in 18-May-2014was 212, up from 170 in 2011/06/27.  EKG shows normal sinus rhythm with a rate of 62 beats per minute, no significant ST or T wave changes   Outpatient Encounter Prescriptions as of 09/06/2013  Medication Sig  . ALPRAZolam (XANAX) 1 MG tablet Take 1 mg by  mouth 2 (two) times daily as needed. For anxiety  . amLODipine (NORVASC) 2.5 MG tablet Take 2.5 mg by mouth daily.  Marland Kitchen aspirin 81 MG EC tablet Take 81 mg by mouth daily.    . calcium-vitamin D (OSCAL WITH D) 500-200 MG-UNIT per tablet Take 1 tablet by mouth daily.    . clotrimazole-betamethasone (LOTRISONE) cream Apply 1 application topically as needed.  . folic acid (FOLVITE) 800 MCG tablet Take 400 mcg by mouth daily.  . hydrochlorothiazide (HYDRODIURIL) 25 MG tablet Take 25 mg by mouth daily.   Marland Kitchen KLOR-CON M20 20 MEQ tablet Take 20 mEq by mouth daily.   Marland Kitchen levothyroxine (SYNTHROID, LEVOTHROID) 75 MCG tablet Take 75 mcg by mouth daily.  . metoprolol succinate (TOPROL-XL) 100 MG 24 hr tablet Take 1 tablet (100 mg total) by mouth daily. Take with or immediately following a meal.  . nitroGLYCERIN (NITROSTAT) 0.4 MG SL tablet Place 1 tablet (0.4 mg total) under the tongue every 5 (five) minutes as needed.  Marland Kitchen omeprazole (PRILOSEC) 20 MG capsule Take 20 mg by mouth daily.  . simvastatin (ZOCOR) 40 MG tablet Take 40 mg by mouth at bedtime.    . sucralfate (CARAFATE) 1 G tablet Take 1 g by mouth 2 (two) times daily.     Review of Systems  Constitutional: Negative.   HENT: Negative.   Eyes: Negative.   Respiratory: Negative.   Cardiovascular: Negative.   Gastrointestinal: Negative.   Endocrine: Negative.   Musculoskeletal: Positive for  back pain and gait problem.  Skin: Negative.   Allergic/Immunologic: Negative.   Neurological: Negative.   Hematological: Negative.   Psychiatric/Behavioral: Negative.   All other systems reviewed and are negative.   BP 124/80  Pulse 62  Ht 5\' 2"  (1.575 m)  Wt 123 lb 12 oz (56.133 kg)  BMI 22.63 kg/m2  Physical Exam  Nursing note and vitals reviewed. Constitutional: She is oriented to person, place, and time. She appears well-developed and well-nourished.  HENT:  Head: Normocephalic.  Nose: Nose normal.  Mouth/Throat: Oropharynx is clear and moist.   Eyes: Conjunctivae are normal. Pupils are equal, round, and reactive to light.  Neck: Normal range of motion. Neck supple. No JVD present.  Cardiovascular: Normal rate, regular rhythm, S1 normal, S2 normal, normal heart sounds and intact distal pulses.  Exam reveals no gallop and no friction rub.   No murmur heard. Pulmonary/Chest: Effort normal and breath sounds normal. No respiratory distress. She has no wheezes. She has no rales. She exhibits no tenderness.  Abdominal: Soft. Bowel sounds are normal. She exhibits no distension. There is no tenderness.  Musculoskeletal: Normal range of motion. She exhibits no edema and no tenderness.  Lymphadenopathy:    She has no cervical adenopathy.  Neurological: She is alert and oriented to person, place, and time. Coordination normal.  Skin: Skin is warm and dry. No rash noted. No erythema.  Psychiatric: She has a normal mood and affect. Her behavior is normal. Judgment and thought content normal.    Assessment and Plan

## 2013-09-06 NOTE — Assessment & Plan Note (Signed)
With suggested she hold her pain to see if this helps her lower extremity edema. Blood pressure today 120 systolic. She'll monitor her blood pressure at home.

## 2013-09-06 NOTE — Patient Instructions (Signed)
You are doing well. Please stop the amlodipine, this can cause leg swelling Try for one month If swelling no better in one month, Could restart the amlodpine if blood pressure is high  Please call us if you have new issues that need to be addressed before your next appt.  Your physician wants you to follow-up in: 6 months.  You will receive a reminder letter in the mail two months in advance. If you don't receive a letter, please call our office to schedule the follow-up appointment.

## 2013-09-06 NOTE — Assessment & Plan Note (Signed)
Encouraged her to stay on her simvastatin. Goal LDL less than 70 

## 2013-09-06 NOTE — Assessment & Plan Note (Signed)
Currently with no symptoms of angina. No further workup at this time. Continue current medication regimen. 

## 2013-09-06 NOTE — Assessment & Plan Note (Addendum)
Encouraged her to walk with a walker, particularly outside the house. She reports physical [facility] has been [facility] through [facility].

## 2013-09-06 NOTE — Assessment & Plan Note (Signed)
Chronic long-standing ankle edema. She seems very troubled by this, wears compression hose, recently seen by vascular and told she had lymphedema. We will hold her amlodipine for one month to see if she has any improvement

## 2014-02-04 ENCOUNTER — Emergency Department: Payer: Self-pay | Admitting: Internal Medicine

## 2014-02-04 LAB — COMPREHENSIVE METABOLIC PANEL
ANION GAP: 9 (ref 7–16)
AST: 31 U/L (ref 15–37)
Albumin: 2.8 g/dL — ABNORMAL LOW (ref 3.4–5.0)
Alkaline Phosphatase: 49 U/L
BILIRUBIN TOTAL: 0.6 mg/dL (ref 0.2–1.0)
BUN: 15 mg/dL (ref 7–18)
CALCIUM: 8.9 mg/dL (ref 8.5–10.1)
CHLORIDE: 97 mmol/L — AB (ref 98–107)
Co2: 27 mmol/L (ref 21–32)
Creatinine: 1.06 mg/dL (ref 0.60–1.30)
EGFR (African American): >60
EGFR (Non-African Amer.): 52 — ABNORMAL LOW
Glucose: 118 mg/dL — ABNORMAL HIGH (ref 65–99)
Osmolality: 268 (ref 275–301)
POTASSIUM: 3.4 mmol/L — AB (ref 3.5–5.1)
SGPT (ALT): 21 U/L
Sodium: 133 mmol/L — ABNORMAL LOW (ref 136–145)
Total Protein: 5.8 g/dL — ABNORMAL LOW (ref 6.4–8.2)

## 2014-02-04 LAB — URINALYSIS, COMPLETE
BILIRUBIN, UR: NEGATIVE
BLOOD: NEGATIVE
GLUCOSE, UR: NEGATIVE mg/dL (ref 0–75)
Ketone: NEGATIVE
NITRITE: POSITIVE
Ph: 6 (ref 4.5–8.0)
Protein: NEGATIVE
RBC,UR: 7 /HPF (ref 0–5)
Specific Gravity: 1.01 (ref 1.003–1.030)

## 2014-02-04 LAB — CBC
HCT: 33.3 % — ABNORMAL LOW (ref 35.0–47.0)
HGB: 11.3 g/dL — ABNORMAL LOW (ref 12.0–16.0)
MCH: 32.1 pg (ref 26.0–34.0)
MCHC: 34 g/dL (ref 32.0–36.0)
MCV: 95 fL (ref 80–100)
Platelet: 181 10*3/uL (ref 150–440)
RBC: 3.52 10*6/uL — ABNORMAL LOW (ref 3.80–5.20)
RDW: 14 % (ref 11.5–14.5)
WBC: 6.9 10*3/uL (ref 3.6–11.0)

## 2014-02-04 LAB — TROPONIN I: Troponin-I: <0.02 ng/mL

## 2014-02-07 LAB — URINE CULTURE

## 2014-02-09 ENCOUNTER — Ambulatory Visit: Payer: Self-pay | Admitting: Internal Medicine

## 2014-02-12 DIAGNOSIS — N390 Urinary tract infection, site not specified: Secondary | ICD-10-CM | POA: Diagnosis not present

## 2014-02-12 DIAGNOSIS — Z79899 Other long term (current) drug therapy: Secondary | ICD-10-CM | POA: Diagnosis not present

## 2014-02-12 DIAGNOSIS — R410 Disorientation, unspecified: Secondary | ICD-10-CM | POA: Diagnosis not present

## 2014-02-12 DIAGNOSIS — R5383 Other fatigue: Secondary | ICD-10-CM | POA: Diagnosis not present

## 2014-02-12 DIAGNOSIS — M2550 Pain in unspecified joint: Secondary | ICD-10-CM | POA: Diagnosis not present

## 2014-02-12 DIAGNOSIS — R0602 Shortness of breath: Secondary | ICD-10-CM | POA: Diagnosis not present

## 2014-02-12 DIAGNOSIS — J8489 Other specified interstitial pulmonary diseases: Secondary | ICD-10-CM | POA: Diagnosis not present

## 2014-02-14 ENCOUNTER — Ambulatory Visit: Payer: Self-pay | Admitting: Internal Medicine

## 2014-02-14 ENCOUNTER — Inpatient Hospital Stay: Payer: Self-pay | Admitting: Internal Medicine

## 2014-02-14 DIAGNOSIS — E871 Hypo-osmolality and hyponatremia: Secondary | ICD-10-CM | POA: Diagnosis not present

## 2014-02-14 DIAGNOSIS — E876 Hypokalemia: Secondary | ICD-10-CM | POA: Diagnosis not present

## 2014-02-14 DIAGNOSIS — R410 Disorientation, unspecified: Secondary | ICD-10-CM | POA: Diagnosis not present

## 2014-02-14 DIAGNOSIS — N390 Urinary tract infection, site not specified: Secondary | ICD-10-CM | POA: Diagnosis not present

## 2014-02-14 DIAGNOSIS — R4182 Altered mental status, unspecified: Secondary | ICD-10-CM | POA: Diagnosis not present

## 2014-02-14 DIAGNOSIS — E872 Acidosis: Secondary | ICD-10-CM | POA: Diagnosis not present

## 2014-02-14 DIAGNOSIS — R531 Weakness: Secondary | ICD-10-CM | POA: Diagnosis not present

## 2014-02-14 DIAGNOSIS — R413 Other amnesia: Secondary | ICD-10-CM | POA: Diagnosis not present

## 2014-02-14 DIAGNOSIS — M25511 Pain in right shoulder: Secondary | ICD-10-CM | POA: Diagnosis not present

## 2014-02-14 DIAGNOSIS — Z515 Encounter for palliative care: Secondary | ICD-10-CM | POA: Diagnosis not present

## 2014-02-14 DIAGNOSIS — J069 Acute upper respiratory infection, unspecified: Secondary | ICD-10-CM | POA: Diagnosis not present

## 2014-02-14 DIAGNOSIS — R627 Adult failure to thrive: Secondary | ICD-10-CM | POA: Diagnosis not present

## 2014-02-14 DIAGNOSIS — Z951 Presence of aortocoronary bypass graft: Secondary | ICD-10-CM | POA: Diagnosis not present

## 2014-02-14 DIAGNOSIS — M6281 Muscle weakness (generalized): Secondary | ICD-10-CM | POA: Diagnosis not present

## 2014-02-14 DIAGNOSIS — E785 Hyperlipidemia, unspecified: Secondary | ICD-10-CM | POA: Diagnosis not present

## 2014-02-14 DIAGNOSIS — F0390 Unspecified dementia without behavioral disturbance: Secondary | ICD-10-CM | POA: Diagnosis not present

## 2014-02-14 DIAGNOSIS — R0602 Shortness of breath: Secondary | ICD-10-CM | POA: Diagnosis not present

## 2014-02-14 DIAGNOSIS — E039 Hypothyroidism, unspecified: Secondary | ICD-10-CM | POA: Diagnosis not present

## 2014-02-14 DIAGNOSIS — I10 Essential (primary) hypertension: Secondary | ICD-10-CM | POA: Diagnosis not present

## 2014-02-14 DIAGNOSIS — I2510 Atherosclerotic heart disease of native coronary artery without angina pectoris: Secondary | ICD-10-CM | POA: Diagnosis not present

## 2014-02-14 DIAGNOSIS — E875 Hyperkalemia: Secondary | ICD-10-CM | POA: Diagnosis not present

## 2014-02-14 LAB — URINALYSIS, COMPLETE
Bacteria: NONE SEEN
Bilirubin,UR: NEGATIVE
Blood: NEGATIVE
Glucose,UR: NEGATIVE mg/dL (ref 0–75)
Ketone: NEGATIVE
LEUKOCYTE ESTERASE: NEGATIVE
Nitrite: NEGATIVE
PH: 6 (ref 4.5–8.0)
PROTEIN: NEGATIVE
RBC,UR: <1 /HPF (ref 0–5)
Specific Gravity: 1.003 (ref 1.003–1.030)
Squamous Epithelial: NONE SEEN

## 2014-02-14 LAB — COMPREHENSIVE METABOLIC PANEL
ANION GAP: 11 (ref 7–16)
Albumin: 2.7 g/dL — ABNORMAL LOW (ref 3.4–5.0)
Alkaline Phosphatase: 56 U/L
BUN: 3 mg/dL — ABNORMAL LOW (ref 7–18)
Bilirubin,Total: 0.6 mg/dL (ref 0.2–1.0)
CALCIUM: 8.2 mg/dL — AB (ref 8.5–10.1)
Chloride: 85 mmol/L — ABNORMAL LOW (ref 98–107)
Co2: 26 mmol/L (ref 21–32)
Creatinine: 0.82 mg/dL (ref 0.60–1.30)
EGFR (African American): >60
GLUCOSE: 103 mg/dL — AB (ref 65–99)
OSMOLALITY: 243 (ref 275–301)
POTASSIUM: 2.7 mmol/L — AB (ref 3.5–5.1)
SGOT(AST): 37 U/L (ref 15–37)
SGPT (ALT): 22 U/L
Sodium: 122 mmol/L — ABNORMAL LOW (ref 136–145)
Total Protein: 5.6 g/dL — ABNORMAL LOW (ref 6.4–8.2)

## 2014-02-14 LAB — TSH: THYROID STIMULATING HORM: 0.704 u[IU]/mL

## 2014-02-14 LAB — CBC
HCT: 32.5 % — AB (ref 35.0–47.0)
HGB: 11.3 g/dL — AB (ref 12.0–16.0)
MCH: 31.3 pg (ref 26.0–34.0)
MCHC: 34.8 g/dL (ref 32.0–36.0)
MCV: 90 fL (ref 80–100)
Platelet: 271 10*3/uL (ref 150–440)
RBC: 3.62 10*6/uL — ABNORMAL LOW (ref 3.80–5.20)
RDW: 13.4 % (ref 11.5–14.5)
WBC: 11.9 10*3/uL — AB (ref 3.6–11.0)

## 2014-02-14 LAB — MAGNESIUM: MAGNESIUM: 1.1 mg/dL — AB

## 2014-02-14 LAB — TROPONIN I: Troponin-I: <0.02 ng/mL

## 2014-02-15 LAB — BASIC METABOLIC PANEL
Anion Gap: 9 (ref 7–16)
BUN: 4 mg/dL — AB (ref 7–18)
CALCIUM: 8.2 mg/dL — AB (ref 8.5–10.1)
CREATININE: 0.84 mg/dL (ref 0.60–1.30)
Chloride: 88 mmol/L — ABNORMAL LOW (ref 98–107)
Co2: 24 mmol/L (ref 21–32)
EGFR (African American): >60
Glucose: 114 mg/dL — ABNORMAL HIGH (ref 65–99)
Osmolality: 242 (ref 275–301)
POTASSIUM: 3.6 mmol/L (ref 3.5–5.1)
SODIUM: 121 mmol/L — AB (ref 136–145)

## 2014-02-15 LAB — OSMOLALITY, URINE: Osmolality: 122 mOsm/kg

## 2014-02-15 LAB — SODIUM, URINE, RANDOM: SODIUM, URINE RANDOM: 20 mmol/L (ref 20–110)

## 2014-02-16 LAB — BASIC METABOLIC PANEL
ANION GAP: 8 (ref 7–16)
BUN: 5 mg/dL — AB (ref 7–18)
CHLORIDE: 97 mmol/L — AB (ref 98–107)
Calcium, Total: 8.3 mg/dL — ABNORMAL LOW (ref 8.5–10.1)
Co2: 24 mmol/L (ref 21–32)
Creatinine: 0.75 mg/dL (ref 0.60–1.30)
EGFR (African American): >60
GLUCOSE: 110 mg/dL — AB (ref 65–99)
OSMOLALITY: 257 (ref 275–301)
Potassium: 3.6 mmol/L (ref 3.5–5.1)
Sodium: 129 mmol/L — ABNORMAL LOW (ref 136–145)

## 2014-02-16 LAB — CBC WITH DIFFERENTIAL/PLATELET
BASOS ABS: 0.1 10*3/uL (ref 0.0–0.1)
Basophil %: 0.7 %
Eosinophil #: 0.2 10*3/uL (ref 0.0–0.7)
Eosinophil %: 2.4 %
HCT: 29.2 % — ABNORMAL LOW (ref 35.0–47.0)
HGB: 10.2 g/dL — ABNORMAL LOW (ref 12.0–16.0)
LYMPHS PCT: 8 %
Lymphocyte #: 0.7 10*3/uL — ABNORMAL LOW (ref 1.0–3.6)
MCH: 31.6 pg (ref 26.0–34.0)
MCHC: 34.9 g/dL (ref 32.0–36.0)
MCV: 90 fL (ref 80–100)
MONO ABS: 0.9 x10 3/mm (ref 0.2–0.9)
Monocyte %: 9.9 %
NEUTROS PCT: 79 %
Neutrophil #: 6.8 10*3/uL — ABNORMAL HIGH (ref 1.4–6.5)
Platelet: 235 10*3/uL (ref 150–440)
RBC: 3.23 10*6/uL — ABNORMAL LOW (ref 3.80–5.20)
RDW: 13.2 % (ref 11.5–14.5)
WBC: 8.7 10*3/uL (ref 3.6–11.0)

## 2014-02-16 LAB — URINE CULTURE

## 2014-02-16 LAB — MAGNESIUM: MAGNESIUM: 1.9 mg/dL

## 2014-02-17 LAB — CBC WITH DIFFERENTIAL/PLATELET
BASOS PCT: 0.6 %
Basophil #: 0 10*3/uL (ref 0.0–0.1)
Eosinophil #: 0.2 10*3/uL (ref 0.0–0.7)
Eosinophil %: 2.3 %
HCT: 26.8 % — AB (ref 35.0–47.0)
HGB: 9.3 g/dL — AB (ref 12.0–16.0)
LYMPHS ABS: 0.6 10*3/uL — AB (ref 1.0–3.6)
LYMPHS PCT: 9.5 %
MCH: 31.9 pg (ref 26.0–34.0)
MCHC: 34.7 g/dL (ref 32.0–36.0)
MCV: 92 fL (ref 80–100)
Monocyte #: 0.9 x10 3/mm (ref 0.2–0.9)
Monocyte %: 12.8 %
NEUTROS ABS: 5.1 10*3/uL (ref 1.4–6.5)
Neutrophil %: 74.8 %
Platelet: 218 10*3/uL (ref 150–440)
RBC: 2.92 10*6/uL — ABNORMAL LOW (ref 3.80–5.20)
RDW: 13.4 % (ref 11.5–14.5)
WBC: 6.8 10*3/uL (ref 3.6–11.0)

## 2014-02-17 LAB — BASIC METABOLIC PANEL
ANION GAP: 8 (ref 7–16)
BUN: 6 mg/dL — AB (ref 7–18)
CHLORIDE: 99 mmol/L (ref 98–107)
CO2: 22 mmol/L (ref 21–32)
CREATININE: 0.82 mg/dL (ref 0.60–1.30)
Calcium, Total: 7.9 mg/dL — ABNORMAL LOW (ref 8.5–10.1)
EGFR (African American): >60
EGFR (Non-African Amer.): >60
Glucose: 103 mg/dL — ABNORMAL HIGH (ref 65–99)
Osmolality: 257 (ref 275–301)
POTASSIUM: 3.7 mmol/L (ref 3.5–5.1)
Sodium: 129 mmol/L — ABNORMAL LOW (ref 136–145)

## 2014-02-18 LAB — CBC WITH DIFFERENTIAL/PLATELET
BASOS ABS: 0 10*3/uL (ref 0.0–0.1)
Basophil %: 0.1 %
Eosinophil #: 0 10*3/uL (ref 0.0–0.7)
Eosinophil %: 0.1 %
HCT: 27.9 % — ABNORMAL LOW (ref 35.0–47.0)
HGB: 9.5 g/dL — ABNORMAL LOW (ref 12.0–16.0)
Lymphocyte #: 0.4 10*3/uL — ABNORMAL LOW (ref 1.0–3.6)
Lymphocyte %: 7.1 %
MCH: 31.7 pg (ref 26.0–34.0)
MCHC: 34.2 g/dL (ref 32.0–36.0)
MCV: 93 fL (ref 80–100)
MONO ABS: 0.5 x10 3/mm (ref 0.2–0.9)
MONOS PCT: 7.7 %
NEUTROS ABS: 5.1 10*3/uL (ref 1.4–6.5)
Neutrophil %: 85 %
PLATELETS: 261 10*3/uL (ref 150–440)
RBC: 3 10*6/uL — ABNORMAL LOW (ref 3.80–5.20)
RDW: 13.2 % (ref 11.5–14.5)
WBC: 6 10*3/uL (ref 3.6–11.0)

## 2014-02-18 LAB — BASIC METABOLIC PANEL
Anion Gap: 7 (ref 7–16)
BUN: 6 mg/dL — ABNORMAL LOW (ref 7–18)
CHLORIDE: 101 mmol/L (ref 98–107)
Calcium, Total: 8.4 mg/dL — ABNORMAL LOW (ref 8.5–10.1)
Co2: 24 mmol/L (ref 21–32)
Creatinine: 0.93 mg/dL (ref 0.60–1.30)
EGFR (African American): >60
EGFR (Non-African Amer.): >60
Glucose: 130 mg/dL — ABNORMAL HIGH (ref 65–99)
Osmolality: 264 (ref 275–301)
Potassium: 3.4 mmol/L — ABNORMAL LOW (ref 3.5–5.1)
SODIUM: 132 mmol/L — AB (ref 136–145)

## 2014-02-19 ENCOUNTER — Encounter: Payer: Self-pay | Admitting: Internal Medicine

## 2014-02-19 DIAGNOSIS — Z951 Presence of aortocoronary bypass graft: Secondary | ICD-10-CM | POA: Diagnosis not present

## 2014-02-19 DIAGNOSIS — J069 Acute upper respiratory infection, unspecified: Secondary | ICD-10-CM | POA: Diagnosis not present

## 2014-02-19 DIAGNOSIS — E876 Hypokalemia: Secondary | ICD-10-CM | POA: Diagnosis not present

## 2014-02-19 DIAGNOSIS — M6281 Muscle weakness (generalized): Secondary | ICD-10-CM | POA: Diagnosis not present

## 2014-02-19 DIAGNOSIS — F0390 Unspecified dementia without behavioral disturbance: Secondary | ICD-10-CM | POA: Diagnosis not present

## 2014-02-19 DIAGNOSIS — D649 Anemia, unspecified: Secondary | ICD-10-CM | POA: Diagnosis not present

## 2014-02-19 DIAGNOSIS — R410 Disorientation, unspecified: Secondary | ICD-10-CM | POA: Diagnosis not present

## 2014-02-19 DIAGNOSIS — E871 Hypo-osmolality and hyponatremia: Secondary | ICD-10-CM | POA: Diagnosis not present

## 2014-02-19 DIAGNOSIS — I2510 Atherosclerotic heart disease of native coronary artery without angina pectoris: Secondary | ICD-10-CM | POA: Diagnosis not present

## 2014-02-19 DIAGNOSIS — R531 Weakness: Secondary | ICD-10-CM | POA: Diagnosis not present

## 2014-02-19 DIAGNOSIS — G9340 Encephalopathy, unspecified: Secondary | ICD-10-CM | POA: Diagnosis not present

## 2014-02-19 DIAGNOSIS — R627 Adult failure to thrive: Secondary | ICD-10-CM | POA: Diagnosis not present

## 2014-02-19 DIAGNOSIS — I10 Essential (primary) hypertension: Secondary | ICD-10-CM | POA: Diagnosis not present

## 2014-02-19 DIAGNOSIS — R0602 Shortness of breath: Secondary | ICD-10-CM | POA: Diagnosis not present

## 2014-02-19 DIAGNOSIS — R4182 Altered mental status, unspecified: Secondary | ICD-10-CM | POA: Diagnosis not present

## 2014-02-19 LAB — CBC WITH DIFFERENTIAL/PLATELET
Basophil #: 0.1 10*3/uL (ref 0.0–0.1)
Basophil %: 0.4 %
Eosinophil #: 0.1 10*3/uL (ref 0.0–0.7)
Eosinophil %: 0.8 %
HCT: 29.3 % — ABNORMAL LOW (ref 35.0–47.0)
HGB: 9.8 g/dL — ABNORMAL LOW (ref 12.0–16.0)
Lymphocyte #: 0.7 10*3/uL — ABNORMAL LOW (ref 1.0–3.6)
Lymphocyte %: 5.6 %
MCH: 31.3 pg (ref 26.0–34.0)
MCHC: 33.5 g/dL (ref 32.0–36.0)
MCV: 93 fL (ref 80–100)
MONOS PCT: 10.6 %
Monocyte #: 1.3 x10 3/mm — ABNORMAL HIGH (ref 0.2–0.9)
NEUTROS ABS: 9.8 10*3/uL — AB (ref 1.4–6.5)
Neutrophil %: 82.6 %
Platelet: 285 10*3/uL (ref 150–440)
RBC: 3.15 10*6/uL — ABNORMAL LOW (ref 3.80–5.20)
RDW: 13.3 % (ref 11.5–14.5)
WBC: 11.9 10*3/uL — AB (ref 3.6–11.0)

## 2014-02-19 LAB — CULTURE, BLOOD (SINGLE)

## 2014-02-19 LAB — BASIC METABOLIC PANEL
Anion Gap: 9 (ref 7–16)
BUN: 7 mg/dL (ref 7–18)
Calcium, Total: 8.2 mg/dL — ABNORMAL LOW (ref 8.5–10.1)
Chloride: 100 mmol/L (ref 98–107)
Co2: 23 mmol/L (ref 21–32)
Creatinine: 0.77 mg/dL (ref 0.60–1.30)
Glucose: 86 mg/dL (ref 65–99)
OSMOLALITY: 262 (ref 275–301)
Potassium: 3.2 mmol/L — ABNORMAL LOW (ref 3.5–5.1)
Sodium: 132 mmol/L — ABNORMAL LOW (ref 136–145)

## 2014-02-20 DIAGNOSIS — J069 Acute upper respiratory infection, unspecified: Secondary | ICD-10-CM | POA: Diagnosis not present

## 2014-02-20 DIAGNOSIS — G9340 Encephalopathy, unspecified: Secondary | ICD-10-CM | POA: Diagnosis not present

## 2014-02-22 DIAGNOSIS — R4182 Altered mental status, unspecified: Secondary | ICD-10-CM | POA: Diagnosis not present

## 2014-02-22 DIAGNOSIS — D649 Anemia, unspecified: Secondary | ICD-10-CM | POA: Diagnosis not present

## 2014-02-22 DIAGNOSIS — E876 Hypokalemia: Secondary | ICD-10-CM | POA: Diagnosis not present

## 2014-02-22 DIAGNOSIS — E871 Hypo-osmolality and hyponatremia: Secondary | ICD-10-CM | POA: Diagnosis not present

## 2014-02-22 LAB — COMPREHENSIVE METABOLIC PANEL
ANION GAP: 9 (ref 7–16)
Albumin: 2.1 g/dL — ABNORMAL LOW (ref 3.4–5.0)
Alkaline Phosphatase: 48 U/L
BILIRUBIN TOTAL: 0.4 mg/dL (ref 0.2–1.0)
BUN: 11 mg/dL (ref 7–18)
CHLORIDE: 103 mmol/L (ref 98–107)
CO2: 26 mmol/L (ref 21–32)
CREATININE: 0.97 mg/dL (ref 0.60–1.30)
Calcium, Total: 7.7 mg/dL — ABNORMAL LOW (ref 8.5–10.1)
EGFR (Non-African Amer.): 57 — ABNORMAL LOW
Glucose: 92 mg/dL (ref 65–99)
OSMOLALITY: 275 (ref 275–301)
POTASSIUM: 3.4 mmol/L — AB (ref 3.5–5.1)
SGOT(AST): 29 U/L (ref 15–37)
SGPT (ALT): 20 U/L
SODIUM: 138 mmol/L (ref 136–145)
Total Protein: 4.4 g/dL — ABNORMAL LOW (ref 6.4–8.2)

## 2014-02-22 LAB — CBC WITH DIFFERENTIAL/PLATELET
Basophil #: 0 10*3/uL (ref 0.0–0.1)
Basophil %: 0.6 %
Eosinophil #: 0.2 10*3/uL (ref 0.0–0.7)
Eosinophil %: 3.1 %
HCT: 29.2 % — ABNORMAL LOW (ref 35.0–47.0)
HGB: 9.7 g/dL — ABNORMAL LOW (ref 12.0–16.0)
Lymphocyte #: 0.8 10*3/uL — ABNORMAL LOW (ref 1.0–3.6)
Lymphocyte %: 11.3 %
MCH: 30.9 pg (ref 26.0–34.0)
MCHC: 33.2 g/dL (ref 32.0–36.0)
MCV: 93 fL (ref 80–100)
Monocyte #: 0.9 x10 3/mm (ref 0.2–0.9)
Monocyte %: 12.5 %
Neutrophil #: 5.2 10*3/uL (ref 1.4–6.5)
Neutrophil %: 72.5 %
Platelet: 276 10*3/uL (ref 150–440)
RBC: 3.14 10*6/uL — ABNORMAL LOW (ref 3.80–5.20)
RDW: 13.5 % (ref 11.5–14.5)
WBC: 7.2 10*3/uL (ref 3.6–11.0)

## 2014-02-22 LAB — TSH: THYROID STIMULATING HORM: 1.5 u[IU]/mL

## 2014-02-27 DIAGNOSIS — D649 Anemia, unspecified: Secondary | ICD-10-CM | POA: Diagnosis not present

## 2014-02-27 DIAGNOSIS — R4182 Altered mental status, unspecified: Secondary | ICD-10-CM | POA: Diagnosis not present

## 2014-02-27 DIAGNOSIS — E876 Hypokalemia: Secondary | ICD-10-CM | POA: Diagnosis not present

## 2014-02-27 DIAGNOSIS — E871 Hypo-osmolality and hyponatremia: Secondary | ICD-10-CM | POA: Diagnosis not present

## 2014-02-27 LAB — CBC WITH DIFFERENTIAL/PLATELET
Basophil #: 0.1 10*3/uL (ref 0.0–0.1)
Basophil %: 1 %
Eosinophil #: 0.4 10*3/uL (ref 0.0–0.7)
Eosinophil %: 6.8 %
HCT: 29.6 % — ABNORMAL LOW (ref 35.0–47.0)
HGB: 9.7 g/dL — AB (ref 12.0–16.0)
Lymphocyte #: 1.1 10*3/uL (ref 1.0–3.6)
Lymphocyte %: 20.5 %
MCH: 30.8 pg (ref 26.0–34.0)
MCHC: 32.6 g/dL (ref 32.0–36.0)
MCV: 94 fL (ref 80–100)
Monocyte #: 0.6 x10 3/mm (ref 0.2–0.9)
Monocyte %: 11.6 %
NEUTROS PCT: 60.1 %
Neutrophil #: 3.1 10*3/uL (ref 1.4–6.5)
Platelet: 247 10*3/uL (ref 150–440)
RBC: 3.13 10*6/uL — AB (ref 3.80–5.20)
RDW: 14.2 % (ref 11.5–14.5)
WBC: 5.2 10*3/uL (ref 3.6–11.0)

## 2014-02-27 LAB — BASIC METABOLIC PANEL
Anion Gap: 8 (ref 7–16)
BUN: 22 mg/dL — ABNORMAL HIGH (ref 7–18)
CALCIUM: 8.6 mg/dL (ref 8.5–10.1)
CHLORIDE: 103 mmol/L (ref 98–107)
Co2: 29 mmol/L (ref 21–32)
Creatinine: 1.07 mg/dL (ref 0.60–1.30)
EGFR (African American): >60
EGFR (Non-African Amer.): 51 — ABNORMAL LOW
Glucose: 89 mg/dL (ref 65–99)
OSMOLALITY: 282 (ref 275–301)
POTASSIUM: 3.7 mmol/L (ref 3.5–5.1)
Sodium: 140 mmol/L (ref 136–145)

## 2014-03-04 DIAGNOSIS — R4182 Altered mental status, unspecified: Secondary | ICD-10-CM | POA: Diagnosis not present

## 2014-03-04 DIAGNOSIS — E876 Hypokalemia: Secondary | ICD-10-CM | POA: Diagnosis not present

## 2014-03-04 DIAGNOSIS — E871 Hypo-osmolality and hyponatremia: Secondary | ICD-10-CM | POA: Diagnosis not present

## 2014-03-04 DIAGNOSIS — D649 Anemia, unspecified: Secondary | ICD-10-CM | POA: Diagnosis not present

## 2014-03-04 LAB — URINALYSIS, COMPLETE
Bilirubin,UR: NEGATIVE
Blood: NEGATIVE
GLUCOSE, UR: NEGATIVE mg/dL (ref 0–75)
Hyaline Cast: 3
Ketone: NEGATIVE
Leukocyte Esterase: NEGATIVE
NITRITE: NEGATIVE
Ph: 6 (ref 4.5–8.0)
Protein: NEGATIVE
RBC,UR: NONE SEEN /HPF (ref 0–5)
Specific Gravity: 1.008 (ref 1.003–1.030)
WBC UR: NONE SEEN /HPF (ref 0–5)

## 2014-03-05 ENCOUNTER — Ambulatory Visit: Payer: Medicare HMO | Admitting: Cardiovascular Disease

## 2014-03-05 DIAGNOSIS — E871 Hypo-osmolality and hyponatremia: Secondary | ICD-10-CM | POA: Diagnosis not present

## 2014-03-05 DIAGNOSIS — E876 Hypokalemia: Secondary | ICD-10-CM | POA: Diagnosis not present

## 2014-03-05 DIAGNOSIS — D649 Anemia, unspecified: Secondary | ICD-10-CM | POA: Diagnosis not present

## 2014-03-05 DIAGNOSIS — R4182 Altered mental status, unspecified: Secondary | ICD-10-CM | POA: Diagnosis not present

## 2014-03-05 LAB — CBC WITH DIFFERENTIAL/PLATELET
BASOS ABS: 0 10*3/uL (ref 0.0–0.1)
Basophil %: 0.8 %
EOS ABS: 0.2 10*3/uL (ref 0.0–0.7)
Eosinophil %: 3.2 %
HCT: 32.1 % — ABNORMAL LOW (ref 35.0–47.0)
HGB: 10.8 g/dL — ABNORMAL LOW (ref 12.0–16.0)
LYMPHS PCT: 21 %
Lymphocyte #: 1.2 10*3/uL (ref 1.0–3.6)
MCH: 31.1 pg (ref 26.0–34.0)
MCHC: 33.7 g/dL (ref 32.0–36.0)
MCV: 92 fL (ref 80–100)
Monocyte #: 0.7 x10 3/mm (ref 0.2–0.9)
Monocyte %: 12.1 %
NEUTROS ABS: 3.7 10*3/uL (ref 1.4–6.5)
NEUTROS PCT: 62.9 %
Platelet: 351 10*3/uL (ref 150–440)
RBC: 3.47 10*6/uL — AB (ref 3.80–5.20)
RDW: 14 % (ref 11.5–14.5)
WBC: 5.8 10*3/uL (ref 3.6–11.0)

## 2014-03-05 LAB — COMPREHENSIVE METABOLIC PANEL
ALK PHOS: 50 U/L
ALT: 14 U/L
Albumin: 2.5 g/dL — ABNORMAL LOW (ref 3.4–5.0)
Anion Gap: 8 (ref 7–16)
BUN: 26 mg/dL — AB (ref 7–18)
Bilirubin,Total: 0.3 mg/dL (ref 0.2–1.0)
CHLORIDE: 102 mmol/L (ref 98–107)
Calcium, Total: 9.3 mg/dL (ref 8.5–10.1)
Co2: 31 mmol/L (ref 21–32)
Creatinine: 1.36 mg/dL — ABNORMAL HIGH (ref 0.60–1.30)
EGFR (African American): 47 — ABNORMAL LOW
EGFR (Non-African Amer.): 39 — ABNORMAL LOW
Glucose: 107 mg/dL — ABNORMAL HIGH (ref 65–99)
OSMOLALITY: 286 (ref 275–301)
Potassium: 3.4 mmol/L — ABNORMAL LOW (ref 3.5–5.1)
SGOT(AST): 25 U/L (ref 15–37)
SODIUM: 141 mmol/L (ref 136–145)
TOTAL PROTEIN: 6 g/dL — AB (ref 6.4–8.2)

## 2014-03-06 LAB — URINE CULTURE

## 2014-03-08 DIAGNOSIS — R4182 Altered mental status, unspecified: Secondary | ICD-10-CM | POA: Diagnosis not present

## 2014-03-08 DIAGNOSIS — E871 Hypo-osmolality and hyponatremia: Secondary | ICD-10-CM | POA: Diagnosis not present

## 2014-03-08 DIAGNOSIS — E876 Hypokalemia: Secondary | ICD-10-CM | POA: Diagnosis not present

## 2014-03-08 DIAGNOSIS — D649 Anemia, unspecified: Secondary | ICD-10-CM | POA: Diagnosis not present

## 2014-03-08 LAB — CBC WITH DIFFERENTIAL/PLATELET
BASOS ABS: 0 10*3/uL (ref 0.0–0.1)
BASOS PCT: 0.5 %
EOS ABS: 0.3 10*3/uL (ref 0.0–0.7)
Eosinophil %: 5 %
HCT: 31.3 % — ABNORMAL LOW (ref 35.0–47.0)
HGB: 10.1 g/dL — ABNORMAL LOW (ref 12.0–16.0)
LYMPHS PCT: 16.9 %
Lymphocyte #: 1.1 10*3/uL (ref 1.0–3.6)
MCH: 30.1 pg (ref 26.0–34.0)
MCHC: 32.4 g/dL (ref 32.0–36.0)
MCV: 93 fL (ref 80–100)
MONO ABS: 0.7 x10 3/mm (ref 0.2–0.9)
Monocyte %: 10.9 %
NEUTROS PCT: 66.7 %
Neutrophil #: 4.3 10*3/uL (ref 1.4–6.5)
Platelet: 335 10*3/uL (ref 150–440)
RBC: 3.37 10*6/uL — AB (ref 3.80–5.20)
RDW: 14.3 % (ref 11.5–14.5)
WBC: 6.4 10*3/uL (ref 3.6–11.0)

## 2014-03-08 LAB — COMPREHENSIVE METABOLIC PANEL
ALT: 12 U/L — AB (ref 14–63)
AST: 30 U/L (ref 15–37)
Albumin: 2.3 g/dL — ABNORMAL LOW (ref 3.4–5.0)
Alkaline Phosphatase: 48 U/L (ref 46–116)
Anion Gap: 8 (ref 7–16)
BUN: 28 mg/dL — ABNORMAL HIGH (ref 7–18)
Bilirubin,Total: 0.3 mg/dL (ref 0.2–1.0)
CO2: 28 mmol/L (ref 21–32)
Calcium, Total: 9.3 mg/dL (ref 8.5–10.1)
Chloride: 103 mmol/L (ref 98–107)
Creatinine: 1.12 mg/dL (ref 0.60–1.30)
GFR CALC AF AMER: 59 — AB
GFR CALC NON AF AMER: 49 — AB
Glucose: 88 mg/dL (ref 65–99)
Osmolality: 282 (ref 275–301)
Potassium: 3.3 mmol/L — ABNORMAL LOW (ref 3.5–5.1)
Sodium: 139 mmol/L (ref 136–145)
Total Protein: 5.6 g/dL — ABNORMAL LOW (ref 6.4–8.2)

## 2014-03-12 ENCOUNTER — Encounter: Payer: Self-pay | Admitting: Internal Medicine

## 2014-03-12 ENCOUNTER — Ambulatory Visit: Payer: Self-pay | Admitting: Internal Medicine

## 2014-03-13 DIAGNOSIS — E876 Hypokalemia: Secondary | ICD-10-CM | POA: Diagnosis not present

## 2014-03-13 DIAGNOSIS — E871 Hypo-osmolality and hyponatremia: Secondary | ICD-10-CM | POA: Diagnosis not present

## 2014-03-13 LAB — CBC WITH DIFFERENTIAL/PLATELET
BASOS ABS: 0 10*3/uL (ref 0.0–0.1)
Basophil %: 0.7 %
Eosinophil #: 0.5 10*3/uL (ref 0.0–0.7)
Eosinophil %: 6.9 %
HCT: 32.7 % — ABNORMAL LOW (ref 35.0–47.0)
HGB: 10.6 g/dL — ABNORMAL LOW (ref 12.0–16.0)
Lymphocyte #: 1 10*3/uL (ref 1.0–3.6)
Lymphocyte %: 14.7 %
MCH: 30.3 pg (ref 26.0–34.0)
MCHC: 32.5 g/dL (ref 32.0–36.0)
MCV: 93 fL (ref 80–100)
MONO ABS: 1 x10 3/mm — AB (ref 0.2–0.9)
MONOS PCT: 14.6 %
Neutrophil #: 4.3 10*3/uL (ref 1.4–6.5)
Neutrophil %: 63.1 %
PLATELETS: 299 10*3/uL (ref 150–440)
RBC: 3.51 10*6/uL — AB (ref 3.80–5.20)
RDW: 14.4 % (ref 11.5–14.5)
WBC: 6.9 10*3/uL (ref 3.6–11.0)

## 2014-03-13 LAB — COMPREHENSIVE METABOLIC PANEL
ALBUMIN: 2.6 g/dL — AB (ref 3.4–5.0)
ALT: 13 U/L — AB (ref 14–63)
AST: 20 U/L (ref 15–37)
Alkaline Phosphatase: 50 U/L (ref 46–116)
Anion Gap: 8 (ref 7–16)
BUN: 31 mg/dL — ABNORMAL HIGH (ref 7–18)
Bilirubin,Total: 0.2 mg/dL (ref 0.2–1.0)
CALCIUM: 9.6 mg/dL (ref 8.5–10.1)
Chloride: 103 mmol/L (ref 98–107)
Co2: 29 mmol/L (ref 21–32)
Creatinine: 1.38 mg/dL — ABNORMAL HIGH (ref 0.60–1.30)
EGFR (African American): 46 — ABNORMAL LOW
EGFR (Non-African Amer.): 38 — ABNORMAL LOW
GLUCOSE: 96 mg/dL (ref 65–99)
Osmolality: 286 (ref 275–301)
Potassium: 3.7 mmol/L (ref 3.5–5.1)
Sodium: 140 mmol/L (ref 136–145)
Total Protein: 6 g/dL — ABNORMAL LOW (ref 6.4–8.2)

## 2014-03-15 DIAGNOSIS — E876 Hypokalemia: Secondary | ICD-10-CM | POA: Diagnosis not present

## 2014-03-15 DIAGNOSIS — E871 Hypo-osmolality and hyponatremia: Secondary | ICD-10-CM | POA: Diagnosis not present

## 2014-03-15 LAB — COMPREHENSIVE METABOLIC PANEL
ALT: 17 U/L (ref 14–63)
ANION GAP: 7 (ref 7–16)
AST: 25 U/L (ref 15–37)
Albumin: 2.7 g/dL — ABNORMAL LOW (ref 3.4–5.0)
Alkaline Phosphatase: 55 U/L (ref 46–116)
BUN: 33 mg/dL — AB (ref 7–18)
Bilirubin,Total: 0.4 mg/dL (ref 0.2–1.0)
CALCIUM: 9.9 mg/dL (ref 8.5–10.1)
CHLORIDE: 103 mmol/L (ref 98–107)
CO2: 30 mmol/L (ref 21–32)
Creatinine: 1.45 mg/dL — ABNORMAL HIGH (ref 0.60–1.30)
EGFR (African American): 44 — ABNORMAL LOW
GFR CALC NON AF AMER: 36 — AB
Glucose: 89 mg/dL (ref 65–99)
Osmolality: 286 (ref 275–301)
POTASSIUM: 4.1 mmol/L (ref 3.5–5.1)
Sodium: 140 mmol/L (ref 136–145)
TOTAL PROTEIN: 6.3 g/dL — AB (ref 6.4–8.2)

## 2014-03-15 LAB — CBC WITH DIFFERENTIAL/PLATELET
BASOS ABS: 0.1 10*3/uL (ref 0.0–0.1)
Basophil %: 0.7 %
EOS ABS: 0.4 10*3/uL (ref 0.0–0.7)
Eosinophil %: 5.6 %
HCT: 35.6 % (ref 35.0–47.0)
HGB: 11.7 g/dL — ABNORMAL LOW (ref 12.0–16.0)
LYMPHS PCT: 19.6 %
Lymphocyte #: 1.5 10*3/uL (ref 1.0–3.6)
MCH: 30.7 pg (ref 26.0–34.0)
MCHC: 32.9 g/dL (ref 32.0–36.0)
MCV: 93 fL (ref 80–100)
MONO ABS: 0.9 x10 3/mm (ref 0.2–0.9)
Monocyte %: 12.3 %
NEUTROS ABS: 4.6 10*3/uL (ref 1.4–6.5)
Neutrophil %: 61.8 %
Platelet: 302 10*3/uL (ref 150–440)
RBC: 3.81 10*6/uL (ref 3.80–5.20)
RDW: 14.4 % (ref 11.5–14.5)
WBC: 7.4 10*3/uL (ref 3.6–11.0)

## 2014-03-21 DIAGNOSIS — I2510 Atherosclerotic heart disease of native coronary artery without angina pectoris: Secondary | ICD-10-CM | POA: Diagnosis not present

## 2014-03-21 DIAGNOSIS — M6281 Muscle weakness (generalized): Secondary | ICD-10-CM | POA: Diagnosis not present

## 2014-03-21 DIAGNOSIS — E876 Hypokalemia: Secondary | ICD-10-CM | POA: Diagnosis not present

## 2014-03-21 DIAGNOSIS — J069 Acute upper respiratory infection, unspecified: Secondary | ICD-10-CM | POA: Diagnosis not present

## 2014-03-21 DIAGNOSIS — F0390 Unspecified dementia without behavioral disturbance: Secondary | ICD-10-CM | POA: Diagnosis not present

## 2014-03-21 DIAGNOSIS — I10 Essential (primary) hypertension: Secondary | ICD-10-CM | POA: Diagnosis not present

## 2014-03-21 DIAGNOSIS — R0602 Shortness of breath: Secondary | ICD-10-CM | POA: Diagnosis not present

## 2014-03-21 DIAGNOSIS — E871 Hypo-osmolality and hyponatremia: Secondary | ICD-10-CM | POA: Diagnosis not present

## 2014-03-21 DIAGNOSIS — Z951 Presence of aortocoronary bypass graft: Secondary | ICD-10-CM | POA: Diagnosis not present

## 2014-03-26 DIAGNOSIS — N390 Urinary tract infection, site not specified: Secondary | ICD-10-CM | POA: Diagnosis not present

## 2014-03-27 DIAGNOSIS — R262 Difficulty in walking, not elsewhere classified: Secondary | ICD-10-CM | POA: Diagnosis not present

## 2014-03-27 DIAGNOSIS — I2510 Atherosclerotic heart disease of native coronary artery without angina pectoris: Secondary | ICD-10-CM | POA: Diagnosis not present

## 2014-03-27 DIAGNOSIS — I10 Essential (primary) hypertension: Secondary | ICD-10-CM | POA: Diagnosis not present

## 2014-03-27 DIAGNOSIS — F0390 Unspecified dementia without behavioral disturbance: Secondary | ICD-10-CM | POA: Diagnosis not present

## 2014-03-27 DIAGNOSIS — M6281 Muscle weakness (generalized): Secondary | ICD-10-CM | POA: Diagnosis not present

## 2014-03-29 DIAGNOSIS — F5104 Psychophysiologic insomnia: Secondary | ICD-10-CM | POA: Diagnosis not present

## 2014-03-29 DIAGNOSIS — E039 Hypothyroidism, unspecified: Secondary | ICD-10-CM | POA: Diagnosis not present

## 2014-03-29 DIAGNOSIS — Z87440 Personal history of urinary (tract) infections: Secondary | ICD-10-CM | POA: Insufficient documentation

## 2014-03-29 DIAGNOSIS — I25810 Atherosclerosis of coronary artery bypass graft(s) without angina pectoris: Secondary | ICD-10-CM | POA: Diagnosis not present

## 2014-03-29 DIAGNOSIS — I2510 Atherosclerotic heart disease of native coronary artery without angina pectoris: Secondary | ICD-10-CM | POA: Diagnosis not present

## 2014-03-29 DIAGNOSIS — R262 Difficulty in walking, not elsewhere classified: Secondary | ICD-10-CM | POA: Diagnosis not present

## 2014-03-29 DIAGNOSIS — M6281 Muscle weakness (generalized): Secondary | ICD-10-CM | POA: Diagnosis not present

## 2014-03-29 DIAGNOSIS — F0390 Unspecified dementia without behavioral disturbance: Secondary | ICD-10-CM | POA: Diagnosis not present

## 2014-03-29 DIAGNOSIS — I10 Essential (primary) hypertension: Secondary | ICD-10-CM | POA: Diagnosis not present

## 2014-04-02 DIAGNOSIS — I2510 Atherosclerotic heart disease of native coronary artery without angina pectoris: Secondary | ICD-10-CM | POA: Diagnosis not present

## 2014-04-02 DIAGNOSIS — I10 Essential (primary) hypertension: Secondary | ICD-10-CM | POA: Diagnosis not present

## 2014-04-02 DIAGNOSIS — R262 Difficulty in walking, not elsewhere classified: Secondary | ICD-10-CM | POA: Diagnosis not present

## 2014-04-02 DIAGNOSIS — F0390 Unspecified dementia without behavioral disturbance: Secondary | ICD-10-CM | POA: Diagnosis not present

## 2014-04-02 DIAGNOSIS — M6281 Muscle weakness (generalized): Secondary | ICD-10-CM | POA: Diagnosis not present

## 2014-04-04 DIAGNOSIS — I2510 Atherosclerotic heart disease of native coronary artery without angina pectoris: Secondary | ICD-10-CM | POA: Diagnosis not present

## 2014-04-04 DIAGNOSIS — F0390 Unspecified dementia without behavioral disturbance: Secondary | ICD-10-CM | POA: Diagnosis not present

## 2014-04-04 DIAGNOSIS — I10 Essential (primary) hypertension: Secondary | ICD-10-CM | POA: Diagnosis not present

## 2014-04-04 DIAGNOSIS — M6281 Muscle weakness (generalized): Secondary | ICD-10-CM | POA: Diagnosis not present

## 2014-04-04 DIAGNOSIS — R262 Difficulty in walking, not elsewhere classified: Secondary | ICD-10-CM | POA: Diagnosis not present

## 2014-04-05 DIAGNOSIS — I10 Essential (primary) hypertension: Secondary | ICD-10-CM | POA: Diagnosis not present

## 2014-04-05 DIAGNOSIS — I2510 Atherosclerotic heart disease of native coronary artery without angina pectoris: Secondary | ICD-10-CM | POA: Diagnosis not present

## 2014-04-05 DIAGNOSIS — R262 Difficulty in walking, not elsewhere classified: Secondary | ICD-10-CM | POA: Diagnosis not present

## 2014-04-05 DIAGNOSIS — F0390 Unspecified dementia without behavioral disturbance: Secondary | ICD-10-CM | POA: Diagnosis not present

## 2014-04-05 DIAGNOSIS — M6281 Muscle weakness (generalized): Secondary | ICD-10-CM | POA: Diagnosis not present

## 2014-04-06 DIAGNOSIS — F0390 Unspecified dementia without behavioral disturbance: Secondary | ICD-10-CM | POA: Diagnosis not present

## 2014-04-06 DIAGNOSIS — R262 Difficulty in walking, not elsewhere classified: Secondary | ICD-10-CM | POA: Diagnosis not present

## 2014-04-06 DIAGNOSIS — M6281 Muscle weakness (generalized): Secondary | ICD-10-CM | POA: Diagnosis not present

## 2014-04-06 DIAGNOSIS — I10 Essential (primary) hypertension: Secondary | ICD-10-CM | POA: Diagnosis not present

## 2014-04-06 DIAGNOSIS — I2510 Atherosclerotic heart disease of native coronary artery without angina pectoris: Secondary | ICD-10-CM | POA: Diagnosis not present

## 2014-04-09 DIAGNOSIS — F0390 Unspecified dementia without behavioral disturbance: Secondary | ICD-10-CM | POA: Diagnosis not present

## 2014-04-09 DIAGNOSIS — R262 Difficulty in walking, not elsewhere classified: Secondary | ICD-10-CM | POA: Diagnosis not present

## 2014-04-09 DIAGNOSIS — I10 Essential (primary) hypertension: Secondary | ICD-10-CM | POA: Diagnosis not present

## 2014-04-09 DIAGNOSIS — I2510 Atherosclerotic heart disease of native coronary artery without angina pectoris: Secondary | ICD-10-CM | POA: Diagnosis not present

## 2014-04-09 DIAGNOSIS — M6281 Muscle weakness (generalized): Secondary | ICD-10-CM | POA: Diagnosis not present

## 2014-04-10 ENCOUNTER — Encounter
Admit: 2014-04-10 | Disposition: A | Discharge status: Home / Self Care | Payer: Self-pay | Attending: Internal Medicine | Admitting: Internal Medicine

## 2014-04-11 DIAGNOSIS — M6281 Muscle weakness (generalized): Secondary | ICD-10-CM | POA: Diagnosis not present

## 2014-04-11 DIAGNOSIS — F0390 Unspecified dementia without behavioral disturbance: Secondary | ICD-10-CM | POA: Diagnosis not present

## 2014-04-11 DIAGNOSIS — I2510 Atherosclerotic heart disease of native coronary artery without angina pectoris: Secondary | ICD-10-CM | POA: Diagnosis not present

## 2014-04-11 DIAGNOSIS — I10 Essential (primary) hypertension: Secondary | ICD-10-CM | POA: Diagnosis not present

## 2014-04-11 DIAGNOSIS — R262 Difficulty in walking, not elsewhere classified: Secondary | ICD-10-CM | POA: Diagnosis not present

## 2014-04-16 ENCOUNTER — Telehealth: Payer: Self-pay | Admitting: *Deleted

## 2014-04-16 DIAGNOSIS — F0390 Unspecified dementia without behavioral disturbance: Secondary | ICD-10-CM | POA: Diagnosis not present

## 2014-04-16 DIAGNOSIS — M6281 Muscle weakness (generalized): Secondary | ICD-10-CM | POA: Diagnosis not present

## 2014-04-16 DIAGNOSIS — R262 Difficulty in walking, not elsewhere classified: Secondary | ICD-10-CM | POA: Diagnosis not present

## 2014-04-16 DIAGNOSIS — I10 Essential (primary) hypertension: Secondary | ICD-10-CM | POA: Diagnosis not present

## 2014-04-16 DIAGNOSIS — I2510 Atherosclerotic heart disease of native coronary artery without angina pectoris: Secondary | ICD-10-CM | POA: Diagnosis not present

## 2014-04-16 NOTE — Telephone Encounter (Signed)
Left message for pt to call back  °

## 2014-04-16 NOTE — Telephone Encounter (Signed)
Pt daughter is calling stating that pt is having swelling in ankles  Pt C/O swelling: STAT is pt has developed SOB within 24 hours  1. How long have you been experiencing swelling? Was in hospital about a month an half ago. Since the change in medication. They adjusted medication here and there, so since then.   2. Where is the swelling located?  Both Ankle   3.  Are you currently taking a "fluid pill"? Yes. But it is a low dose since they whole hospital visit. Its changed periodically.   4.  Are you currently SOB? Not that she is aware of  5.  Have you traveled recently? [facility] then to [facility]

## 2014-04-17 DIAGNOSIS — M6281 Muscle weakness (generalized): Secondary | ICD-10-CM | POA: Diagnosis not present

## 2014-04-17 DIAGNOSIS — F0390 Unspecified dementia without behavioral disturbance: Secondary | ICD-10-CM | POA: Diagnosis not present

## 2014-04-17 DIAGNOSIS — R262 Difficulty in walking, not elsewhere classified: Secondary | ICD-10-CM | POA: Diagnosis not present

## 2014-04-17 DIAGNOSIS — I2510 Atherosclerotic heart disease of native coronary artery without angina pectoris: Secondary | ICD-10-CM | POA: Diagnosis not present

## 2014-04-17 DIAGNOSIS — I10 Essential (primary) hypertension: Secondary | ICD-10-CM | POA: Diagnosis not present

## 2014-04-18 DIAGNOSIS — F0390 Unspecified dementia without behavioral disturbance: Secondary | ICD-10-CM | POA: Diagnosis not present

## 2014-04-18 DIAGNOSIS — M6281 Muscle weakness (generalized): Secondary | ICD-10-CM | POA: Diagnosis not present

## 2014-04-18 DIAGNOSIS — I2510 Atherosclerotic heart disease of native coronary artery without angina pectoris: Secondary | ICD-10-CM | POA: Diagnosis not present

## 2014-04-18 DIAGNOSIS — R262 Difficulty in walking, not elsewhere classified: Secondary | ICD-10-CM | POA: Diagnosis not present

## 2014-04-18 DIAGNOSIS — I10 Essential (primary) hypertension: Secondary | ICD-10-CM | POA: Diagnosis not present

## 2014-04-23 NOTE — Telephone Encounter (Signed)
Pt sched to see Dr. Mariah MillingGollan 04/24/14.

## 2014-04-24 ENCOUNTER — Encounter: Payer: Self-pay | Admitting: Cardiovascular Disease

## 2014-04-24 ENCOUNTER — Ambulatory Visit (INDEPENDENT_AMBULATORY_CARE_PROVIDER_SITE_OTHER): Payer: Commercial Managed Care - HMO | Admitting: Cardiovascular Disease

## 2014-04-24 VITALS — BP 110/72 | HR 74 | Ht 62.0 in | Wt 128.5 lb

## 2014-04-24 DIAGNOSIS — I10 Essential (primary) hypertension: Secondary | ICD-10-CM | POA: Diagnosis not present

## 2014-04-24 DIAGNOSIS — R609 Edema, unspecified: Secondary | ICD-10-CM

## 2014-04-24 DIAGNOSIS — R296 Repeated falls: Secondary | ICD-10-CM | POA: Diagnosis not present

## 2014-04-24 DIAGNOSIS — E785 Hyperlipidemia, unspecified: Secondary | ICD-10-CM

## 2014-04-24 DIAGNOSIS — F0390 Unspecified dementia without behavioral disturbance: Secondary | ICD-10-CM | POA: Diagnosis not present

## 2014-04-24 DIAGNOSIS — M6281 Muscle weakness (generalized): Secondary | ICD-10-CM | POA: Diagnosis not present

## 2014-04-24 DIAGNOSIS — N179 Acute kidney failure, unspecified: Secondary | ICD-10-CM

## 2014-04-24 DIAGNOSIS — M25473 Effusion, unspecified ankle: Secondary | ICD-10-CM

## 2014-04-24 DIAGNOSIS — I2510 Atherosclerotic heart disease of native coronary artery without angina pectoris: Secondary | ICD-10-CM | POA: Diagnosis not present

## 2014-04-24 DIAGNOSIS — R262 Difficulty in walking, not elsewhere classified: Secondary | ICD-10-CM | POA: Diagnosis not present

## 2014-04-24 DIAGNOSIS — I25700 Atherosclerosis of coronary artery bypass graft(s), unspecified, with unstable angina pectoris: Secondary | ICD-10-CM

## 2014-04-24 NOTE — Patient Instructions (Signed)
You are doing well. No medication changes were made.  We will check labs today (BMP)  Please call us if you have new issues that need to be addressed before your next appt.  Your physician wants you to follow-up in: 6 months.  You will receive a reminder letter in the mail two months in advance. If you don't receive a letter, please call our office to schedule the follow-up appointment.

## 2014-04-24 NOTE — Assessment & Plan Note (Signed)
Lymphedema, mild. Compression hose in place She is currently taking torsemide 10 mg daily. We will check a basic metabolic panel. She's not on potassium

## 2014-04-24 NOTE — Assessment & Plan Note (Signed)
Recommended that she continue on her simvastatin. Goal LDL less than 70

## 2014-04-24 NOTE — Assessment & Plan Note (Signed)
Currently with no symptoms of angina. No further workup at this time. Continue current medication regimen. 

## 2014-04-24 NOTE — Progress Notes (Signed)
Patient ID: [name], female    DOB: 1923/01/23, 81 y.o.   MRN: [id number]  HPI Comments: 81 year-old woman with a history of bypass surgery in 29-Jun-2001, stress test April 2010 showing inferior wall ischemia with cardiac catheterization at that time showing occlusion of her native RCA and occlusion of her vein graft to the PDA with collateralized vessels from left to right, mild carotid arterial disease, moderate to severe MR by echocardiogram at [facility] in March 2010 who presents for routine followup of her coronary artery disease. She has a history of diverticular disease and significant GI bleeding. Previous history of falls.   In follow-up today, she had urinary tract infection January 2016, severe hypokalemia, abnormal sodium at that time. required rehabilitation at [facility] for one month, now living at [facility] as she is unable to take care of herself at home. She presents today with a friend. In general she is doing well, she wears compression hose daily, reports that her lymphedema has improved, now mild. She is not using her lymphedema pumps She walks with a walker.  She's not doing any regular exercise. She reports blood pressure is well controlled  EKG on today's visit shows normal sinus rhythm , heart rate in the 70s , no significant ST or T wave changes  Other past medical history Previous cortisone shots to her back and hips with significant improvement of her pain.  Her husband died In 06-30-2011. He died of complications while in the hospital.    episode of chest pain in November 2014. She was evaluated in the hospital, ruled out for MI, had a stress test that showed no ischemia on 12/30/2012. She was started on sucralfate and symptoms have resolved.   Cholesterol higher at the end of 06-29-12 when she was off her simvastatin. Total cholesterol that time 250.   Cholesterol in 2014/05/21was 212, up from 170 in Jun 30, 2011.     Allergies  Allergen Reactions  . Iodine      Rash/hives    Outpatient Encounter Prescriptions as of 79/15/2016  Medication Sig  . acetaminophen (TYLENOL) 650 MG CR tablet Take 650 mg by mouth 2 (two) times daily.  Marland Kitchen ALPRAZolam (XANAX) 1 MG tablet Take 1 mg by mouth 2 (two) times daily as needed. For anxiety  . amLODipine (NORVASC) 2.5 MG tablet Take 2.5 mg by mouth daily.  Marland Kitchen aspirin 81 MG EC tablet Take 81 mg by mouth daily.    . clotrimazole-betamethasone (LOTRISONE) cream Apply 1 application topically as needed.  . docusate sodium (COLACE) 100 MG capsule Take 100 mg by mouth 2 (two) times daily.  Marland Kitchen gabapentin (NEURONTIN) 100 MG capsule Take 100 mg by mouth 3 (three) times daily.  Marland Kitchen HYDROcodone-acetaminophen (NORCO/VICODIN) 5-325 MG per tablet Take 1 tablet by mouth 2 (two) times daily as needed for moderate pain.  Marland Kitchen KLOR-CON M20 20 MEQ tablet Take 20 mEq by mouth 2 (two) times daily.   Marland Kitchen levothyroxine (SYNTHROID, LEVOTHROID) 75 MCG tablet Take 75 mcg by mouth daily.  . metoprolol succinate (TOPROL-XL) 100 MG 24 hr tablet Take 1 tablet (100 mg total) by mouth daily. Take with or immediately following a meal.  . mirtazapine (REMERON) 7.5 MG tablet Take 7.5 mg by mouth at bedtime.  . nitroGLYCERIN (NITROSTAT) 0.4 MG SL tablet Place 1 tablet (0.4 mg total) under the tongue every 5 (five) minutes as needed.  Marland Kitchen omeprazole (PRILOSEC) 20 MG capsule Take 40 mg by mouth daily.   . simvastatin (ZOCOR)  40 MG tablet Take 40 mg by mouth at bedtime.    . sucralfate (CARAFATE) 1 G tablet Take 1 g by mouth 2 (two) times daily.   Marland Kitchen. torsemide (DEMADEX) 10 MG tablet Take 10 mg by mouth daily.  . vitamin B-12 (CYANOCOBALAMIN) 1000 MCG tablet Take 1,000 mcg by mouth daily.  . [DISCONTINUED] calcium-vitamin D (OSCAL WITH D) 500-200 MG-UNIT per tablet Take 1 tablet by mouth daily.    . [DISCONTINUED] folic acid (FOLVITE) 800 MCG tablet Take 400 mcg by mouth daily.  . [DISCONTINUED] hydrochlorothiazide (HYDRODIURIL) 25 MG tablet Take 25 mg by mouth daily.      Past Medical History  Diagnosis Date  . Hyperlipidemia   . Hypertension   . Hypothyroidism   . GERD (gastroesophageal reflux disease)   . Pneumonia   . Lymphedema     Past Surgical History  Procedure Laterality Date  . Cholecystectomy    . Abdominal hysterectomy    . Coronary artery bypass graft      Social History  reports that she has never smoked. She has never used smokeless tobacco. She reports that she does not drink alcohol or use illicit drugs.  Family History Family history is unknown by patient.  Review of Systems  Constitutional: Negative.   Respiratory: Negative.   Cardiovascular: Negative.   Gastrointestinal: Negative.   Musculoskeletal: Positive for back pain and gait problem.  Skin: Negative.   Neurological: Negative.   Hematological: Negative.   Psychiatric/Behavioral: Negative.   All other systems reviewed and are negative.   BP 110/72 mmHg  Pulse 74  Ht 5\' 2"  (1.575 m)  Wt 128 lb 8 oz (58.287 kg)  BMI 23.50 kg/m2  Physical Exam  Constitutional: She is oriented to person, place, and time. She appears well-developed and well-nourished.  HENT:  Head: Normocephalic.  Nose: Nose normal.  Mouth/Throat: Oropharynx is clear and moist.  Eyes: Conjunctivae are normal. Pupils are equal, round, and reactive to light.  Neck: Normal range of motion. Neck supple. No JVD present.  Cardiovascular: Normal rate, regular rhythm, S1 normal, S2 normal, normal heart sounds and intact distal pulses.  Exam reveals no gallop and no friction rub.   No murmur heard. Pulmonary/Chest: Effort normal and breath sounds normal. No respiratory distress. She has no wheezes. She has no rales. She exhibits no tenderness.  Abdominal: Soft. Bowel sounds are normal. She exhibits no distension. There is no tenderness.  Musculoskeletal: Normal range of motion. She exhibits no edema or tenderness.  Lymphadenopathy:    She has no cervical adenopathy.  Neurological: She is alert  and oriented to person, place, and time. Coordination normal.  Skin: Skin is warm and dry. No rash noted. No erythema.  Psychiatric: She has a normal mood and affect. Her behavior is normal. Judgment and thought content normal.    Assessment and Plan  Nursing note and vitals reviewed.

## 2014-04-24 NOTE — Assessment & Plan Note (Signed)
No recent falls, currently living in a nursing home. Completed one month of rehabilitation

## 2014-04-24 NOTE — Assessment & Plan Note (Signed)
Blood pressure is well controlled on today's visit. No changes made to the medications. 

## 2014-04-25 LAB — BASIC METABOLIC PANEL
BUN/Creatinine Ratio: 14 (ref 11–26)
BUN: 18 mg/dL (ref 8–27)
CALCIUM: 10 mg/dL (ref 8.7–10.3)
CO2: 18 mmol/L (ref 18–29)
Chloride: 103 mmol/L (ref 97–108)
Creatinine, Ser: 1.26 mg/dL — ABNORMAL HIGH (ref 0.57–1.00)
GFR calc Af Amer: 44 mL/min/{1.73_m2} — ABNORMAL LOW (ref >59)
GFR, EST NON AFRICAN AMERICAN: 38 mL/min/{1.73_m2} — AB (ref >59)
GLUCOSE: 112 mg/dL — AB (ref 65–99)
POTASSIUM: 5.1 mmol/L (ref 3.5–5.2)
Sodium: 140 mmol/L (ref 134–144)

## 2014-04-26 DIAGNOSIS — F0390 Unspecified dementia without behavioral disturbance: Secondary | ICD-10-CM | POA: Diagnosis not present

## 2014-04-26 DIAGNOSIS — I10 Essential (primary) hypertension: Secondary | ICD-10-CM | POA: Diagnosis not present

## 2014-04-26 DIAGNOSIS — I2510 Atherosclerotic heart disease of native coronary artery without angina pectoris: Secondary | ICD-10-CM | POA: Diagnosis not present

## 2014-04-26 DIAGNOSIS — R262 Difficulty in walking, not elsewhere classified: Secondary | ICD-10-CM | POA: Diagnosis not present

## 2014-04-26 DIAGNOSIS — M6281 Muscle weakness (generalized): Secondary | ICD-10-CM | POA: Diagnosis not present

## 2014-04-27 ENCOUNTER — Telehealth: Payer: Self-pay

## 2014-04-27 NOTE — Telephone Encounter (Signed)
Would like lab results

## 2014-04-27 NOTE — Telephone Encounter (Signed)
Patient daughter calling for results.  [facility].  Patient daughter was notified that it will more than likely be Monday that we would call her with results.

## 2014-04-30 NOTE — Telephone Encounter (Signed)
Pt daughter calling about lab results.  Stated to her doctor is in hospital today I will send a note asking about it She understood  Please call when labs are ready

## 2014-04-30 NOTE — Telephone Encounter (Signed)
Bmp looks ok, Potassium high normal She does not need a potassium supplement Ok to continue torsemide

## 2014-05-01 NOTE — Telephone Encounter (Signed)
Spoke w/ pt's daughter.  Advised her of Dr. Windell HummingbirdGollan's recommendation.  She verbalizes understanding and asks that I fax an order to [facility] to hold pt's K+. She will call back w/ the fax #.

## 2014-05-01 NOTE — Telephone Encounter (Signed)
Left message for [name] to call back.

## 2014-05-01 NOTE — Telephone Encounter (Signed)
Debi left message w/ fax #:  [facility], [phone]. Faxed order to d/c pt's K+.

## 2014-05-02 DIAGNOSIS — I10 Essential (primary) hypertension: Secondary | ICD-10-CM | POA: Diagnosis not present

## 2014-05-02 DIAGNOSIS — F0390 Unspecified dementia without behavioral disturbance: Secondary | ICD-10-CM | POA: Diagnosis not present

## 2014-05-02 DIAGNOSIS — I2510 Atherosclerotic heart disease of native coronary artery without angina pectoris: Secondary | ICD-10-CM | POA: Diagnosis not present

## 2014-05-02 DIAGNOSIS — R262 Difficulty in walking, not elsewhere classified: Secondary | ICD-10-CM | POA: Diagnosis not present

## 2014-05-02 DIAGNOSIS — M6281 Muscle weakness (generalized): Secondary | ICD-10-CM | POA: Diagnosis not present

## 2014-05-03 DIAGNOSIS — M6281 Muscle weakness (generalized): Secondary | ICD-10-CM | POA: Diagnosis not present

## 2014-05-03 DIAGNOSIS — I2510 Atherosclerotic heart disease of native coronary artery without angina pectoris: Secondary | ICD-10-CM | POA: Diagnosis not present

## 2014-05-03 DIAGNOSIS — I10 Essential (primary) hypertension: Secondary | ICD-10-CM | POA: Diagnosis not present

## 2014-05-03 DIAGNOSIS — F0390 Unspecified dementia without behavioral disturbance: Secondary | ICD-10-CM | POA: Diagnosis not present

## 2014-05-04 DIAGNOSIS — F0390 Unspecified dementia without behavioral disturbance: Secondary | ICD-10-CM | POA: Diagnosis not present

## 2014-05-04 DIAGNOSIS — R262 Difficulty in walking, not elsewhere classified: Secondary | ICD-10-CM | POA: Diagnosis not present

## 2014-05-04 DIAGNOSIS — I10 Essential (primary) hypertension: Secondary | ICD-10-CM | POA: Diagnosis not present

## 2014-05-04 DIAGNOSIS — M6281 Muscle weakness (generalized): Secondary | ICD-10-CM | POA: Diagnosis not present

## 2014-05-04 DIAGNOSIS — I2510 Atherosclerotic heart disease of native coronary artery without angina pectoris: Secondary | ICD-10-CM | POA: Diagnosis not present

## 2014-05-08 DIAGNOSIS — I2510 Atherosclerotic heart disease of native coronary artery without angina pectoris: Secondary | ICD-10-CM | POA: Diagnosis not present

## 2014-05-08 DIAGNOSIS — I10 Essential (primary) hypertension: Secondary | ICD-10-CM | POA: Diagnosis not present

## 2014-05-08 DIAGNOSIS — R262 Difficulty in walking, not elsewhere classified: Secondary | ICD-10-CM | POA: Diagnosis not present

## 2014-05-08 DIAGNOSIS — F0390 Unspecified dementia without behavioral disturbance: Secondary | ICD-10-CM | POA: Diagnosis not present

## 2014-05-08 DIAGNOSIS — M6281 Muscle weakness (generalized): Secondary | ICD-10-CM | POA: Diagnosis not present

## 2014-05-11 ENCOUNTER — Encounter
Admit: 2014-05-11 | Disposition: A | Discharge status: Home / Self Care | Payer: Self-pay | Attending: Internal Medicine | Admitting: Internal Medicine

## 2014-05-11 DIAGNOSIS — I2510 Atherosclerotic heart disease of native coronary artery without angina pectoris: Secondary | ICD-10-CM | POA: Diagnosis not present

## 2014-05-11 DIAGNOSIS — R262 Difficulty in walking, not elsewhere classified: Secondary | ICD-10-CM | POA: Diagnosis not present

## 2014-05-11 DIAGNOSIS — F0390 Unspecified dementia without behavioral disturbance: Secondary | ICD-10-CM | POA: Diagnosis not present

## 2014-05-11 DIAGNOSIS — M6281 Muscle weakness (generalized): Secondary | ICD-10-CM | POA: Diagnosis not present

## 2014-05-11 DIAGNOSIS — I10 Essential (primary) hypertension: Secondary | ICD-10-CM | POA: Diagnosis not present

## 2014-05-15 DIAGNOSIS — I10 Essential (primary) hypertension: Secondary | ICD-10-CM | POA: Diagnosis not present

## 2014-05-15 DIAGNOSIS — M6281 Muscle weakness (generalized): Secondary | ICD-10-CM | POA: Diagnosis not present

## 2014-05-15 DIAGNOSIS — I2510 Atherosclerotic heart disease of native coronary artery without angina pectoris: Secondary | ICD-10-CM | POA: Diagnosis not present

## 2014-05-15 DIAGNOSIS — R262 Difficulty in walking, not elsewhere classified: Secondary | ICD-10-CM | POA: Diagnosis not present

## 2014-05-15 DIAGNOSIS — F0390 Unspecified dementia without behavioral disturbance: Secondary | ICD-10-CM | POA: Diagnosis not present

## 2014-05-17 DIAGNOSIS — M6281 Muscle weakness (generalized): Secondary | ICD-10-CM | POA: Diagnosis not present

## 2014-05-17 DIAGNOSIS — I2510 Atherosclerotic heart disease of native coronary artery without angina pectoris: Secondary | ICD-10-CM | POA: Diagnosis not present

## 2014-05-17 DIAGNOSIS — F0390 Unspecified dementia without behavioral disturbance: Secondary | ICD-10-CM | POA: Diagnosis not present

## 2014-05-17 DIAGNOSIS — I10 Essential (primary) hypertension: Secondary | ICD-10-CM | POA: Diagnosis not present

## 2014-05-17 DIAGNOSIS — R262 Difficulty in walking, not elsewhere classified: Secondary | ICD-10-CM | POA: Diagnosis not present

## 2014-05-21 DIAGNOSIS — M6281 Muscle weakness (generalized): Secondary | ICD-10-CM | POA: Diagnosis not present

## 2014-05-21 DIAGNOSIS — R262 Difficulty in walking, not elsewhere classified: Secondary | ICD-10-CM | POA: Diagnosis not present

## 2014-05-21 DIAGNOSIS — I10 Essential (primary) hypertension: Secondary | ICD-10-CM | POA: Diagnosis not present

## 2014-05-21 DIAGNOSIS — F0390 Unspecified dementia without behavioral disturbance: Secondary | ICD-10-CM | POA: Diagnosis not present

## 2014-05-21 DIAGNOSIS — I2510 Atherosclerotic heart disease of native coronary artery without angina pectoris: Secondary | ICD-10-CM | POA: Diagnosis not present

## 2014-05-22 DIAGNOSIS — I10 Essential (primary) hypertension: Secondary | ICD-10-CM | POA: Diagnosis not present

## 2014-05-22 DIAGNOSIS — F0390 Unspecified dementia without behavioral disturbance: Secondary | ICD-10-CM | POA: Diagnosis not present

## 2014-05-22 DIAGNOSIS — I2510 Atherosclerotic heart disease of native coronary artery without angina pectoris: Secondary | ICD-10-CM | POA: Diagnosis not present

## 2014-05-22 DIAGNOSIS — M6281 Muscle weakness (generalized): Secondary | ICD-10-CM | POA: Diagnosis not present

## 2014-05-22 DIAGNOSIS — R262 Difficulty in walking, not elsewhere classified: Secondary | ICD-10-CM | POA: Diagnosis not present

## 2014-06-01 NOTE — H&P (Signed)
PATIENT NAME:  [name], [name] MR#:  [id number] DATE OF BIRTH:  1922/11/30  DATE OF ADMISSION:  12/28/2012   REFERRING PHYSICIAN:  Dr. Manson PasseyBrown.   PRIMARY CARE PHYSICIAN: Dr. Judithann SheenSparks.   CHIEF COMPLAINT: Chest pain.   HISTORY OF PRESENT ILLNESS:  An 81 year old Caucasian female with past medical history of coronary artery disease status post CABG, hypertension, hyperlipidemia, who is presenting with chest pain, originally started in the morning if December 28, 2012 described as epigastric burning sensation, intermittent, 10 out of 10 in intensity without radiation; however, eventually her pain radiated to the retrosternal location, She has found no worsening or relieving factors, had no associated symptoms including shortness of breath, nausea, vomiting or  diaphoresis. She, however, took her blood pressure and found it to be elevated in the systolic of 190s and decided to present to [facility] for further workup and evaluation with the above symptoms currently. She is asymptomatic. She did, however, receive aspirin x 4.   REVIEW OF SYSTEMS:   CONSTITUTIONAL: Denies fever, fatigue, weakness.  EYES: Denies blurred vision, double vision, eye pain.  EARS, NOSE, THROAT: Denies tinnitus, ear pain or hearing loss.  RESPIRATORY: Denies cough, wheeze, shortness of breath.  CARDIOVASCULAR: Positive for retrosternal chest pain as described above, as well as edema which is chronic. Denies any orthopnea, palpitations.  GASTROINTESTINAL: Denies nausea, vomiting, diarrhea, hematemesis.  GENITOURINARY: Denies dysuria, hematuria.  ENDOCRINE: Denies nocturia or thyroid problems.  HEMATOLOGIC AND LYMPHATIC: Denies easy bruising or bleeding.  SKIN: Denies rash or lesions.  MUSCULOSKELETAL: Denies pain in neck, back, shoulder, knees or hips.  NEUROLOGIC: Denies paralysis, paresthesias.  PYSCHIATRIC:  Denies any anxiety or depressive symptoms.  Otherwise, full review of systems performed by me is negative.    PAST MEDICAL HISTORY: Coronary artery disease status post CABG, hypertension, hyperlipidemia, hypothyroidism.   SOCIAL HISTORY: Denies any alcohol, tobacco or drug usage.   FAMILY HISTORY: Positive for hypertension and COPD.   ALLERGIES:  IODINE AND TETANUS TOXOID.   HOME MEDICATIONS: Acetaminophen/oxycodone 325/5 mg 1 to 2 tablets q.4-6 hours as needed for pain, alprazolam 1 mg p.o. at bedtime, aspirin 1 p.o. daily, betamethasone/clotrimazole 0.05%/1% topical apply 3 times daily to affected area, calcium plus vitamin D 500/200 1 tab p.o. b.i.d., folic acid 1 mg p.o. daily, Synthroid 75 mcg p.o. daily, metoprolol 50 mg p.o. daily, multivitamin 1 tab p.o. daily, pantoprazole 40 mg p.o. b.i.d., simvastatin 40 mg p.o. daily.   PHYSICAL EXAMINATION: VITAL SIGNS: Temperature 98.1, heart rate 56, respirations 18, blood pressure 184/77 on  arrival, saturating 98% on room air. Weight is 59 kg, BMI 23.0.  GENERAL: Well-nourished, well-developed Caucasian female who is currently in no acute distress.  HEAD: Normocephalic, atraumatic.  EYES: Pupils equal, round, reactive to light. Extraocular muscles intact. No scleral icterus.  MOUTH: Moist mucosal membranes. Dentition intact. No abscess noted.  EARS, NOSE, THROAT: Throat clear without exudate, no external lesions.  NECK: Supple. No thyromegaly. No nodules. No JVD.  PULMONARY: Clear to auscultation bilaterally. No wheezes, rubs or rhonchi. No use of accessory muscles. Good respiratory effort.  CHEST: Nontender to palpation.  CARDIOVASCULAR: S1, S2, regular rate and rhythm. No murmurs, rubs or gallops. Trace pedal edema to ankles bilaterally. Pedal pulses 2+.  GASTROINTESTINAL: Soft, nontender, nondistended. No masses. No hepatosplenomegaly. Positive bowel sounds.  MUSCULOSKELETAL: No swelling, clubbing. Positive for edema as described above. Range of motion full in all extremities.  NEUROLOGIC: Cranial nerves II through XII intact. No gross motor  neurological deficits. Sensation and  reflexes intact.  SKIN: No ulcerations, lesions, rashes, cyanosis. Skin warm, dry, turgor intact.   PSYCHIATRIC: Mood and affect within normal limits. Awake, alert, oriented x 3. Insight and judgment are within normal limits.   LABORATORY DATA: Sodium 141, potassium 3.3, chloride 106, bicarb 30, BUN 21, creatinine 1.05, glucose 110. LFTs within normal limits. Troponin I less than 0.02. WBC 9.2, hemoglobin 13.3, platelets 205.   EKG: Normal sinus rhythm. No ST or T wave abnormalities.   ASSESSMENT AND PLAN: An 81 year old Caucasian female with past medical history of coronary artery disease status post coronary artery bypass graft, hypertension, hyperlipidemia, presenting with chest pain.  1.  Chest pain. Initial work-up including EKG and enzymes negative. Will trend cardiac enzymes. Admit to observation telemetry. Consult cardiology.  Dr. Mariah Milling has  ordered her 81 year old GI cocktail as also likely etiology of her symptoms. She was given aspirin in the Emergency Department. We will check her lipids and modify risk factors as they arise. 2.  Hypertension. Will restart her home medication of Lopressor. Will add p.r.n. hydralazine if necessary to keep systolic blood pressure less than 180. 3.  Gastroesophageal reflux disease. Continue with pantoprazole.  4.  Hypothyroidism: Continue Synthroid.  5.  Deep venous prophylaxis with sequential compression devices.  6.  The patient is full code.   TIME SPENT: 45 minutes.    ____________________________ Cletis Athens. Hower, MD dkh:NTS D: 12/29/2012 01:42:00 ET T: 12/29/2012 02:39:38 ET JOB#: [id number]  cc: Cletis Athens. Hower, MD, <Dictator> DAVID Synetta Shadow MD ELECTRONICALLY SIGNED 12/29/2012 3:06

## 2014-06-01 NOTE — Consult Note (Signed)
General Aspect [name] is a 82yo female w/ PMHx s/f CAD (s/p CABG x 3 in 2003), h/o mod-severe MR, nonobstructive carotid artery disease, h/o GIB, h/o falls, HTN, HLD, hypothyroidism and GERD who was admitted to [facility] yesterday for chest pain.   3V CABG- LIMA to LAD, VG-OM, VG-PDA in 2003. She underwent Lexiscan Myoview in 2010 revealing inferior ischemia. She underwent cardiac catheterization revealing severe 3V CAD, patent LIMA-LAD, VG-OM, occluded VG-PDA and occluded RCA. Distal L->R RCA collateralization identified. Inferior wall ischemia on Myoview was attributed to this. Mild MR, EF 65%, no WMAs appreciated. Medical therapy was recommended.   She followed up with Dr. Mariah Milling 10/2012. She was c/o LE edema and DOE. Edema was partially attributed to chronic venous insufficiency; however, an echo was ordered to evaluate futher:  2D echo 10/2012: EF 55-60%, grade 1 diastolic dysfunction, mild concentric LVH, mild AI/MR/TR, normal PASP, trivial pericardial effusion.  She denied any chest pain/angina and CAD was deemed to be stable. She has felt well since that time. She is quite independent at home. She is able to complete all ADLs independently. No assistance with mobility. She has not been limited by chest pain or dyspnea. LE edema is much improved. She awoke yesterday and ate breakfast. At some point between breakfast and lunch, she developed 10/10 constant substernal chest burning/pressure w/o radiation, which she attributed to indigestion. Denies relation to meals. She states she has never experienced angina. No dyspnea, diaphoresis, n/v, water brash or belching. No PND, orthopnea, increased LE edema, palpitations, lightheadedness or syncope. She did not feel well for the rest of the day as the discomfort persisted. It was unrelieved by antacids (Tums) or PPI. She did not take her NTG. She checked her BP and it returned elevated (SBP 200s). She subsequently presented to the ED.   Present Illness In  the ED, EKG revealed SR, no new ischemic changes. Initial TnI WNL. BNP 628. CMET- K 3.3, Ca 10.8. CBC WNL. CXR revealed mild but stable cardiomegaly. Tortuous and atherosclerotic thoracic aorta. Old granulomatous disease, with calcified granuloma at the left lung base. Initial BP 211/98. She was admitted by the medicine service. Two subsequent troponins returned WNL. Chest pain did not relieve completely until 2:00 AM today-- clonidine was given earlier for persistently elevated BP. Cardiology requested to consult for chest pain. She did receive a full-dose ASA. BP 150-160/70s this AM.   PAST MEDICAL HISTORY: Coronary artery disease status post CABG, hypertension, hyperlipidemia, hypothyroidism.   PAST SURGICAL HISTORY: CABG x 3 in 2003, cardiac catheterization 2010, cholecystectomy, TAH.   SOCIAL HISTORY: Denies any alcohol, tobacco or drug usage.   FAMILY HISTORY: Positive for hypertension and COPD.   ALLERGIES:  IODINE AND TETANUS TOXOID.   Physical Exam:  GEN no acute distress   HEENT pink conjunctivae, PERRL, hearing intact to voice   NECK supple  No masses  trachea midline  JVP 6-7 cm, no bruits   RESP normal resp effort  clear BS  no use of accessory muscles  no wheezes, rales or rhonchi   CARD Regular rate and rhythm  Normal, S1, S2  No murmur   ABD denies tenderness  no hernia  normal BS   EXTR negative cyanosis/clubbing, negative edema   SKIN normal to palpation, skin turgor good   NEURO follows commands, motor/sensory function intact   PSYCH alert, A+O to time, place, person   Review of Systems:  Subjective/Chief Complaint chest pain   General: Fatigue   Respiratory: No Complaints  Cardiovascular: Chest pain or discomfort  Tightness   Review of Systems: All other systems were reviewed and found to be negative   Home Medications: Medication Instructions Status  acetaminophen-oxyCODONE 325 mg-5 mg tablet 1-2 tab(s) orally every 4-6 hours- as needed  Active   simvastatin 40 mg oral tablet 1 tab(s) orally once a day (at bedtime) Active  aspirin 81 mg oral delayed release tablet 1 tab(s) orally once a day Active  valACYclovir 1 g oral tablet 1 tab(s) orally every 8 hours Active  ALPRAZolam 1 mg oral tablet 1 tab(s) orally once a day (at bedtime) Active  metoprolol succinate 50 mg oral tablet, extended release 1 tab(s) orally once a day Active  cefuroxime 250 mg oral tablet 1 tab(s) orally every 12 hours Active  betamethasone-clotrimazole 0.05%-1% topical cream 1 application topically 3 times a day Active  pantoprazole 40 mg oral delayed release tablet 1 tab(s) orally 2 times a day Active  levofloxacin 750 mg oral tablet 1 tab(s) orally every 48 hours Active  levothyroxine 75 mcg (0.075 mg) oral tablet 1 tab(s) orally once a day Active  calcium-vitamin D 500 mg-200 intl units oral tablet 1 tab(s) orally 2 times a day Active  folic acid 1 mg oral tablet 1 tab(s) orally once a day (in the morning) Active  multivitamin 1 tab(s) orally once a day Active   Lab Results:  Cardiology:  19-Nov-14 22:04   Ventricular Rate 64  Atrial Rate 64  P-R Interval 192  QRS Duration 72  QT 410  QTc 422  P Axis 64  R Axis -2  T Axis 66  ECG interpretation Normal sinus rhythm Normal ECG When compared with ECG of 16-Oct-2012 15:37, No significant change was found ----------unconfirmed---------- Confirmed by OVERREAD, NOT ([id number]), editor PEARSON, BARBARA ([id number]) on 12/29/2012 8:20:27 AM  Routine Chem:  19-Nov-14 21:56   BUN  21  Creatinine (comp) 1.05  Potassium, Serum  3.3  Calcium (Total), Serum  10.8  B-Type Natriuretic Peptide ([facility])  628 (Result(s) reported on 28 Dec 2012 at 10:28PM.)  Cardiac:  19-Nov-14 21:56   Troponin I < 0.02 (0.00-0.05 0.05 ng/mL or less: NEGATIVE  Repeat testing in 3-6 hrs  if clinically indicated. >0.05 ng/mL: POTENTIAL  MYOCARDIAL INJURY. Repeat  testing in 3-6 hrs if  clinically indicated. NOTE: An increase or decrease   of 30% or more on serial  testing suggests a  clinically important change)  20-Nov-14 02:17   Troponin I < 0.02 (0.00-0.05 0.05 ng/mL or less: NEGATIVE  Repeat testing in 3-6 hrs  if clinically indicated. >0.05 ng/mL: POTENTIAL  MYOCARDIAL INJURY. Repeat  testing in 3-6 hrs if  clinically indicated. NOTE: An increase or decrease  of 30% or more on serial  testing suggests a  clinically important change)  CPK-MB, Serum 2.5 (Result(s) reported on 29 Dec 2012 at 03:13AM.)    06:04   Troponin I < 0.02 (0.00-0.05 0.05 ng/mL or less: NEGATIVE  Repeat testing in 3-6 hrs  if clinically indicated. >0.05 ng/mL: POTENTIAL  MYOCARDIAL INJURY. Repeat  testing in 3-6 hrs if  clinically indicated. NOTE: An increase or decrease  of 30% or more on serial  testing suggests a  clinically important change)  CK, Total 55  CPK-MB, Serum 1.9 (Result(s) reported on 29 Dec 2012 at 06:36AM.)   EKG:  Interpretation NSR, borderline 1st degree AVB, RBBB (old), LAD, inferior IVCD   Rate 64   EKG Comparision Not changed from  10/2012 tracing   Radiology Results:  XRay:    19-Nov-14 22:21, Chest PA and Lateral  Chest PA and Lateral   REASON FOR EXAM:    chest pain  COMMENTS:       PROCEDURE: DXR - DXR CHEST PA (OR AP) AND LATERAL  - Dec 28 2012 10:21PM     CLINICAL DATA:  Chest pain    EXAM:  CHEST  2 VIEW    COMPARISON:  10/11/2012    FINDINGS:  Prior CABG.  Mild cardiomegaly.  Old granulomatous disease.  Leftward convexity of the descending thoracic aorta contour, likely  due to tortuosity. Atherosclerotic calcification of the aortic arch  noted.    The lungs appear otherwise clear.  No pleural effusion.     IMPRESSION:  1.Mild but stable cardiomegaly. Tortuous and atherosclerotic  thoracic aorta.  2. Old granulomatous disease, with calcified granuloma at the left  lung base      Electronically Signed    By: Herbie Baltimore M.D.    On: 12/28/2012 22:24         Verified  By: Dellia Cloud, M.D.,    Tetanus /Diphtheria Toxoid: Unknown  Iodine: Rash, Other  Vital Signs/Nurse's Notes: **Vital Signs.:   20-Nov-14 08:49  Temperature Temperature (F) 98  Celsius 36.6  Pulse Pulse 56  Respirations Respirations 17  Systolic BP Systolic BP 161  Diastolic BP (mmHg) Diastolic BP (mmHg) 73  Mean BP 102  Pulse Ox % Pulse Ox % 97  Pulse Ox Activity Level  At rest  Oxygen Delivery Room Air/ 21 %    Impression 81yo female w/ PMHx s/f CAD (s/p CABG x 3 in 2003), h/o mod-severe MR, nonobstructive carotid artery disease, h/o GIB, h/o falls, HTN, HLD, hypothyroidism and GERD who was admitted to [facility] yesterday for chest pain.  1. Chest pain The patient reports experiencing 10/10 constant chest pressure/burning w/o radiation for several hours yesterday. She attributed this to indigestion. She does have GERD. No relation to meals or exertion. There had been no precipitating chest pain over the past few days or weeks. No associated symptoms. Objectively, BP was markedly elevated on admission (SBP 200s). TnI WNL x 3. No new ischemic changes on EKG. She has ruled out overnight. She does have a significant CAD involving both native coronaries and grafts, which are 79 years old at this point. Given constant chest pain for several hours, if ischemic, would expect an elevation in troponin. This discomfort seems to have subsided with control of her BP this morning. DDx includes supply-demand mismatch due to markedly elevated BP and GERD. Goal should be to optimize reflux treatment and control blood pressure.  -- Plan [facility] tomorrow AM. She has not had an ischemic eval in over 4 years and grafts are near end shelf-life. She also has a trip to [place] planned on Sunday. Do suspect BP may have been driving discomfort over a progessive flow-limiting lesion, if cardiac at all.  -- Continue ASA, BB, statin -- Encourage NTG use- this may overlap with esophageal etiologies of  chest pain, however. Could consider Imdur down the road.  -- Add Norvasc for BP and anginal control. If further BP control needed, consider adding ACEi.  -- Check TSH  2. CAD s/p CABG x 3 As above.  -- ASA, BB, statin, NTG SL PRN  3. Aortic atherosclerosis Identified on CXR. Will continue to monitor.   Plan 4. HTN BP better controlled after clonidine early this AM. Ateriosclerosis likely contributing to elevated BP readings.  -- Add  Norvasc, consider ACEi if further control needed -- Could switch Toprol-XL to Coreg to utilize alpha activity/BP effect  5. HLD Continue statin.  -- Check lipid panel  6. GERD Likely contributing to chest pain.  -- Continue PPI. Consider increasing.  -- Could add H2 blocker, antacids.  7. Iodine allergy -- Account for if cath pursued.   Electronic Signatures for Addendum Section:  Lorine BearsArida, Muhammad (MD) (Signed Addendum [phone]-Nov-14 10:08)  The patient was seen and examined. Agree with the above. Prolonged chest pain and heartburn with any new ECG changes and negative cardiac enzymes. Known history of CAD with remote CABG. Most recent ischemic evaluation was in 2010.  Symptoms are likely due to GERD but given known history of CAD, recommend stress test tomorrow.   Electronic Signatures: Gery Prayrguello, Deneice Wack A (PA-C)  (Signed 20-Nov-14 09:54)  Authored: General Aspect/Present Illness, History and Physical Exam, Review of System, Home Medications, Labs, EKG , Radiology, Allergies, Vital Signs/Nurse's Notes, Impression/Plan Lorine BearsArida, Muhammad (MD)  (Signed 20-Nov-14 10:08)  Co-Signer: General Aspect/Present Illness, Home Medications, Labs, EKG , Radiology, Allergies, Vital Signs/Nurse's Notes, Impression/Plan   Last Updated: 20-Nov-14 10:08 by Lorine BearsArida, Muhammad (MD)

## 2014-06-01 NOTE — H&P (Signed)
PATIENT NAME:  [name], [name] MR#:  [id number] DATE OF BIRTH:  May 15, 1922  DATE OF ADMISSION:  10/10/2012  REFERRING PHYSICIAN: Jene Everyobert Kinner, MD  FAMILY PHYSICIAN: Duane LopeJeffrey D. Judithann SheenSparks, MD   REASON FOR ADMISSION: Pneumonia.   HISTORY OF PRESENT ILLNESS: The patient is an 81 year old female with history of coronary artery disease, status post CABG, benign hypertension and hypothyroidism who presents to the Emergency Room with weakness, chills and sweats. In the Emergency Room, the patient was noted to be febrile with a leukocytosis. Chest x-ray revealed right lower lobe infiltrate. She is extremely weak and unable to care for herself. She is now admitted for further evaluation.   PAST MEDICAL HISTORY 1.  ASCVD, status post CABG.   2.  Hypothyroidism.  3.  Benign hypertension.  4.  Hyperlipidemia.  5.  History of diverticulitis.  6.  Status post cholecystectomy.  7.  Status post hysterectomy.   MEDICATIONS 1.  Zocor 40 mg p.o. daily.  2.  Prilosec 20 mg p.o. daily.  3.  Toprol-XL 50 mg p.o. daily.  4.  Aspirin 81 mg p.o. daily.  5.  Folic acid 1 mg p.o. daily.  6.  Synthroid 75 mcg p.o. daily.  7.  Xanax 1 mg p.o. at bedtime.  8.  Multivitamin 1 p.o. daily.  9.  Os-Cal D 1 p.o. b.i.d.   ALLERGIES: IODINE AND TETANUS/DIPHTHERIA.   SOCIAL HISTORY: Negative for alcohol or tobacco abuse.   FAMILY HISTORY: Positive for hypertension and COPD.  Otherwise unremarkable.   REVIEW OF SYSTEMS CONSTITUTIONAL: No documented fever. No change in weight.  EYES: No blurred or double vision. No glaucoma.  ENT: No tinnitus or hearing loss. No nasal discharge or bleeding. No difficulty swallowing.  RESPIRATORY: The patient has had mild cough but no wheezing or hemoptysis. No painful respiration.  CARDIOVASCULAR: No chest pain or orthopnea. No palpitations or syncope.  GASTROINTESTINAL: No nausea, vomiting or diarrhea. No abdominal pain or change in bowel habits.  GENITOURINARY: No dysuria or  hematuria. No incontinence.  ENDOCRINE: No polyuria or polydipsia. No heat or cold intolerance.  HEMATOLOGIC: The patient denies anemia, easy bruising or bleeding.  LYMPHATIC: No swollen glands.  MUSCULOSKELETAL: The patient denies pain in her neck, back, shoulders, knees or hips. No gout.  NEUROLOGIC: No numbness or migraines. Denies stroke or seizures. Does have generalized weakness.  PSYCHIATRIC: The patient denies anxiety, insomnia or depression.   PHYSICAL EXAMINATION GENERAL: The patient is elderly, in no acute distress.  VITAL SIGNS: Vital signs are currently remarkable for a blood pressure of 105/49 with a heart rate of 64 and a respiratory rate of 18. She is currently afebrile.  HEENT: Normocephalic, atraumatic. Pupils are equal, round, reactive to light and accommodation. Extraocular movements are intact. Sclerae are anicteric. Conjunctivae are clear. Oropharynx is dry but clear.  NECK: Supple without JVD. No adenopathy or thyromegaly is noted.  LUNGS: Reveal basilar rhonchi, right greater than left. No wheezes or rales. No dullness. Respiratory effort is normal.  CARDIAC: Regular rate and rhythm with normal S1, S2. No significant rubs, murmurs or gallops. PMI is nondisplaced. Chest wall is nontender.  ABDOMEN: Soft, nontender, with normoactive bowel sounds. No organomegaly or masses were appreciated. No hernias or bruits were noted.  EXTREMITIES: Without clubbing, cyanosis or edema. Pulses were 2+ bilaterally.  SKIN: Warm and dry without rash or lesions.  NEUROLOGIC: Cranial nerves II through XII grossly intact. Deep tendon reflexes were symmetric. Motor and sensory exam is nonfocal.  PSYCHIATRIC: Revealed patient was  alert and oriented to person, place and time. She was cooperative and used good judgment.   LABORATORY DATA: Chest x-ray revealed right lower lobe infiltrate consistent with pneumonia. EKG revealed sinus rhythm with no acute ischemic changes. Her white count was 17.8  with a hemoglobin of 13.4. Glucose was 111 with a BUN of 24, creatinine of 1.18 and a GFR of 41. Troponin was less than 0.02.   ASSESSMENT 1.  Pneumonia.  2.  Dehydration.  3.  Renal insufficiency.  4.  Atherosclerotic cardiovascular disease, status post coronary artery bypass grafting.  5.  Hypothyroidism.   PLAN: The patient will be admitted to the floor with IV antibiotics and IV fluids. Blood cultures have been sent in the Emergency Room. No indication for oxygen at this time. We will begin DuoNeb SVNs. We will continue her outpatient medications. Followup chest x-ray and routine labs in the morning. Further treatment and evaluation will depend upon the patient's progress.   TOTAL TIME SPENT ON THIS PATIENT: 50 minutes.   ____________________________ Duane Lope [name], MD jds:cs D: 10/10/2012 15:01:05 ET T: 10/10/2012 15:17:31 ET JOB#: [id number]  cc: Duane Lope. [name], MD, <Dictator> Krissi Willaims Rodena Medin MD ELECTRONICALLY SIGNED 10/10/2012 20:24

## 2014-06-03 NOTE — H&P (Signed)
PATIENT NAME:  [name], [name] MR#:  [id number] DATE OF BIRTH:  1923-01-08  DATE OF ADMISSION:  03/06/2011  ADMITTING PHYSICIAN: Dr. Enid Baasadhika Aeon Koors.    PRIMARY CARE PHYSICIAN: Dr. Aram BeechamJeffrey Sparks.   PRIMARY CARDIOLOGIST: Dr. Julien Nordmannimothy Gollan.   CHIEF COMPLAINT: Chest pain.   HISTORY OF PRESENT ILLNESS: [name] is an 81 year old pleasant Caucasian female with past medical history significant for coronary artery disease status post bypass graft surgery about nine years ago, history of Clostridium difficile colitis and diverticulosis, hypertension and hypothyroidism who lives at home with her husband, who comes to the hospital complaining of chest pain that woke her up this morning. The patient states she is very active at baseline, does yard work. Never had chest pain on exertion. She has not had any stents after her bypass graft surgery and has been doing well. Her last Myoview was in April 2010 which was normal. She said around 2:00 a.m. she got woke up by chest pain. It was right across her chest, heavy feeling, not radiating anywhere, not associated with nausea, dyspnea, or diaphoresis. She was restless all night in the bed, did not take any pain medication and the pain stayed until early this morning and then she woke up later and she felt fine without any chest pain, but she got concerned and came to the Emergency Room. In the ER, her labs have been negative so far. First set of troponin is negative. She does state she is under a lot of stress lately because of her husband's ill health, so she is being admitted under observation for Myoview in the morning.   PAST MEDICAL HISTORY:  1. Coronary artery disease status post bypass graft surgery.  2. Hypothyroidism.  3. Hypertension.  4. History of gastric ulcer. 5. Diverticulitis.  6. Osteoarthritis.  7. History of Clostridium difficile colitis.   PAST SURGICAL HISTORY:  1. Hysterectomy.  2. Bypass graft surgery. 3. Cholecystectomy.    ALLERGIES: Iodine, tetanus/diphtheria toxin.   HOME MEDICATIONS: 1. Simvastatin 40 mg p.o. daily.  2. Aspirin 81 mg p.o. daily.  3. Toprol 100 mg p.o. daily.  4. Prilosec 20 mg p.o. daily.  5. Folic acid 1 mg p.o. daily.  6. Losartan 50 mg p.o. daily.  7. Multivitamin 1 capsule p.o. daily.  8. Synthroid 50 mcg p.o. daily.  9. Nitroglycerin pill p.r.n. for chest pain.  10. Os-Cal 500 mg/200 international units p.o. b.i.d.  11. Biotin 5000 mcg p.o. daily.   SOCIAL HISTORY: Lives at home with her husband. No history of any smoking or alcohol.   FAMILY HISTORY: Mom with colon cancer. Dad with coronary artery disease.   REVIEW OF SYSTEMS: CONSTITUTIONAL: No fever, fatigue, or weakness. EYES: No blurred vision, double vision, glaucoma, or cataracts. ENT: No tinnitus, ear pain, epistaxis or discharge. RESPIRATORY: No cough, wheeze, hemoptysis, or chronic obstructive pulmonary disease. CARDIOVASCULAR: Positive for chest pain. No orthopnea, edema, arrhythmia, palpitations, or syncope. GASTROINTESTINAL: No nausea, vomiting, abdominal pain, hematemesis or melena. GENITOURINARY: No dysuria, hematuria, renal calculus, frequency, or incontinence. ENDOCRINE: No polyuria, nocturia, thyroid problems, heat or cold intolerance. HEMATOLOGY: No anemia, easy bruising or bleeding. SKIN: No acne, rash, or lesions. MUSCULOSKELETAL: No neck, back, shoulder pain, arthritis, or gout. NEUROLOGIC: No numbness, weakness, cerebrovascular accident, transient ischemic attack, or seizures. PSYCHOLOGICAL: No anxiety, insomnia, or depression.   PHYSICAL EXAMINATION:  VITAL SIGNS: Temperature 97.4 degrees Fahrenheit, pulse 60, respirations 18, blood pressure 157/85, pulse oximetry 99% on room air.   GENERAL: Well built, well nourished female  lying in bed, not in any acute distress.   HEENT: Normocephalic, atraumatic. Pupils equal, round, reacting to light. Anicteric sclerae. Extraocular movements intact. Oropharynx clear  without erythema, mass, or exudates.   NECK: Supple. No thyromegaly, JVD, or carotid bruits. No lymphadenopathy.   LUNGS: Clear to auscultation. No wheeze or crackles. No use of accessory muscles for breathing.   CARDIOVASCULAR: S1, S2, regular rate and rhythm, 3/6 systolic murmur in the aortic area. No rubs or gallops.   ABDOMEN: Soft, nontender, nondistended. No hepatosplenomegaly. Normal bowel sounds.   EXTREMITIES: No pedal edema. No clubbing or cyanosis. 2+ dorsalis pedis pulses palpable bilaterally.   SKIN: No acne, rash, or lesions.   LYMPHATICS: No cervical lymphadenopathy.   NEUROLOGIC: Cranial nerves intact. No focal motor or sensory deficits.   PSYCHOLOGICAL: The patient is awake, alert, oriented x3.   LABORATORY, DIAGNOSTIC, AND RADIOLOGICAL DATA: WBC 6.4, hemoglobin 12.3, hematocrit 36.9, platelet count 206. Sodium 141, potassium 4.1, chloride 104, bicarbonate 27, BUN 23, creatinine 1.1, glucose 89, calcium 9.3. Troponin less than 0.02. Chest x-ray is showing nodular density at the left lung base suggests calcified granuloma, mild hyperinflation and CABG changes. No other new changes have been seen. EKG showing normal sinus rhythm with a couple of PVCs, but otherwise heart rate of 63.   ASSESSMENT AND PLAN: This is an 81 year old female with past medical history of coronary artery disease status post bypass graft surgery, hypertension and hypothyroidism, who comes in for chest pain, currently chest pain free.  1. Unstable angina. Will admit to telemetry under observation. Recycle cardiac enzymes. Myoview in the morning. Last Myoview in April 2010 was normal. Cardiology consult by Dr. Mariah Milling. Aspirin, nitroglycerin, Losartan, Toprol and statin. We will continue other home medications.  2. Hypertension. She is on losartan and Toprol. We will continue the medications.  3. Hypothyroidism. She is on Synthroid.  4. Gastrointestinal and deep vein thrombosis prophylaxis. Placed on  Protonix and also Lovenox.  5. CODE STATUS: FULL CODE.   TIME SPENT ON ADMISSION: 50 minutes.   ____________________________ Enid Baas, MD rk:ap D: 03/06/2011 12:21:51 ET T: 03/06/2011 12:42:35 ET JOB#: [id number]  cc: Enid Baas, MD, <Dictator> Antonieta Iba, MD Duane Lope. [name], MD Enid Baas MD ELECTRONICALLY SIGNED 03/06/2011 14:44

## 2014-06-03 NOTE — Discharge Summary (Signed)
PATIENT NAME:  [name], [name] MR#:  [id number] DATE OF BIRTH:  [birth year]  DATE OF ADMISSION:  03/06/2011 DATE OF DISCHARGE:  03/07/2011  DISCHARGE DIAGNOSES:   1. Chest pain, ruled out myocardial infarction.  2. History of coronary artery disease, status post bypass.   DISCHARGE MEDICATIONS: Her usual medications only-per the Restaurant manager, fast foodrescription Writer.  1. Simvastatin 40 mg daily.  2. Nitroglycerin p.r.n.  3. Multivitamin 1 daily.  4. Toprol-XL 100 mg daily.  5. Losartan 100 mg daily.  6. Levothyroxine 50 mcg daily.  7. Folic acid daily.  8. Biotin daily.  9. Lotrisone cream daily.  10. Aspirin 81 mg daily.   HISTORY AND PHYSICAL: Please see detailed History and Physical done on admission.   HOSPITAL COURSE: The patient was admitted, ruled out for myocardial infarction. She had no further chest pain while here. She did not want her nitroglycerin paste. She had a cardiac Myoview scan done today which was negative per report from multiple cardiologists, including Dr. Juliann Paresallwood and Dr. Mariah MillingGollan. She was having no further issues and was ambulating without difficulty. She wished to go home today, which seems appropriate given the situation.  ____________________________ Marya AmslerMarshall W. Dareen PianoAnderson, MD mwa:cbb D: 03/07/2011 13:02:13 ET T: 03/07/2011 13:39:58 ET JOB#: [id number]  cc: Marya AmslerMarshall W. Dareen PianoAnderson, MD, <Dictator> Lauro RegulusMARSHALL W ANDERSON MD ELECTRONICALLY SIGNED 03/08/2011 9:12

## 2014-06-10 NOTE — H&P (Signed)
PATIENT NAME:  [name], [name] MR#:  [id number] DATE OF BIRTH:  [birth year]  DATE OF ADMISSION:  02/14/2014  REFERRING PHYSICIAN:  Cecille Amsterdam. Mayford Knife, MD.   PRIMARY CARE PHYSICIAN:  Duane Lope. [name], MD, [facility].    CHIEF COMPLAINT:  Weakness.    HISTORY OF PRESENT ILLNESS:  An 81 year old Caucasian female with past medical history of coronary artery disease status post bypass surgery, essential hypertension, hyperlipidemia unspecified, hypothyroidism unspecified presenting with weakness.  She describes a 3 week duration of gradually worsening weakness with associated poor p.o. intake.  Had 1 bout of nausea and vomiting, nonbloody and nonbilious emesis through the entire course.  Despite this, once again progressed with weakness.  She presented to the hospital for the above.  Of note, recently diagnosed with a UTI started on cipro by her PCP.  No further complaints at this time.    REVIEW OF SYSTEMS: CONSTITUTIONAL:  Denies fevers, chills.  Positive for acute weakness.  EYES:  Denies blurry vision, double vision or eye pain.   EARS, NOSE, THROAT:  Denies tinnitus, ear pain or hearing loss.  RESPIRATORY:  Denies cough, wheeze, shortness of breath.   CARDIOVASCULAR:  Denies chest pain, palpitations, edema.   GASTROINTESTINAL:  Positive for nausea, vomiting as described above.  Denies any abdominal pain, diarrhea.   GENITOURINARY:  Denies dysuria or hematuria. ENDOCRINE:  Denies nocturia or thyroid problems.   HEMATOLOGIC/LYMPHATICS:  Denies easy bruising or bleeding.  SKIN: Denies rashes or lesions.  MUSCULOSKELETAL: Denies pain in neck, back, shoulder, knees, hips or arthritic symptoms.  NEUROLOGIC:  Denies any problems with paresthesias.   PSYCHIATRIC: Denies anxiety or depressive symptoms.    Otherwise, full review of systems performed and is negative.   PAST MEDICAL HISTORY:  Coronary artery disease status post bypass surgery, essential hypertension, hyperlipidemia  unspecified, hypothyroidism unspecified.  SOCIAL HISTORY:  Denies any alcohol, tobacco or drug usage.  FAMILY HISTORY:  Positive for hypertension as well as COPD.  ALLERGIES:  IODINE, TETANUS, DIPHTHEROID TOXOID.  HOME MEDICATIONS:  Include aspirin 81 mg p.o. daily, simvastatin 40 mg p.o. daily, alprazolam 1 mg p.o. at bedtime as needed for inability to sleep, Norvasc 2.5 mg p.o. daily, hydrochlorothiazide 25 mg p.o. daily, potassium 20 mEq 2 tablets p.o. daily, sucralfate 1 gram p.o. daily, Prilosec 20 mg p.o. daily, Cipro 500 mg p.o. b.i.d., levothyroxine  daily, Os-Cal plus D 500 mg 2 tablets p.o. daily, folic acid 800 mg p.o. daily.   PHYSICAL EXAMINATION: VITAL SIGNS: Temperature 98.1, heart rate 66, respirations 20, blood pressure 102/78, saturating 100% on room air.  Weight 54.4 kg, BMI 21.3. GENERAL APPEARANCE: A weak, frail appearing, Caucasian female currently in no acute distress. HEENT: Head normocephalic, atraumatic. Eyes:  Pupils equal, round and reactive to light.  Extraocular muscles are intact.  No scleral icterus. Mouth:  Moist mucous membranes.  Dentition intact.  No abscess noted.  Ears, nose, throat clear without exudates.  No external lesions.   NECK: Supple. No thyromegaly. No nodules.  No JVD.   PULMONARY:  Clear to auscultation bilaterally with no wheezes, rales or rhonchi.  No use of accessory muscles. Good respiratory effort.  CHEST: Nontender to palpation.  CARDIOVASCULAR: S1, S2. Regular rate and rhythm. No murmurs, rubs, or gallops. No edema. Pedal pulses 2+ bilaterally.  Edema 1+ to shins bilaterally.   GASTROINTESTINAL: Soft, nontender, nondistended. No masses.  Positive bowel sounds.  No hepatosplenomegaly.  MUSCULOSKELETAL: No swelling, clubbing.  Positive for edema as described above.  Range of  motion full in all extremities.  NEUROLOGIC: Cranial nerves II through XII intact. No gross focal neurological deficits. Sensation intact. Reflexes intact. SKIN: No  ulceration, lesions, rash or cyanosis. Skin warm, dry. Turgor intact.  PSYCHIATRIC: Mood and affect within normal limits. Patient awake, alert and oriented x3. Insight and judgment intact.   LABORATORY DATA: Sodium of 122, potassium 2.7, chloride 85, bicarbonate of 26, BUN 3, creatinine 0.82, glucose 103.  Total protein 5.6, albumin of 2.7.  Otherwise LFTs within normal limits.  WBC of 11.9, hemoglobin 11.3, platelets of 271,000.  Urinalysis negative for evidence of infection.    She has a CT head performed which reveals no acute intracranial process.  Chest x-ray performed which reveals no acute cardiopulmonary process.    ASSESSMENT AND PLAN: An 81 year old Caucasian female with a history of coronary artery disease status post coronary artery bypass grafting, hypertension essential, hyperlipidemia unspecified, hypothyroidism unspecified presenting with weakness found to be in markedly hyponatremic   1. Hyponatremia.  Does have evidence of mild chronic lower extremity edema.  She is also on hydrochlorothiazide so multiple possible etiologies.  She received 2 liters of normal saline thus far in the Emergency Department received 308 mEq of sodium.  She will require 272 mEq of sodium to bring her sodium levels from 122 to 132.  We will recheck basic metabolic panel for sodium levels before adding more normal saline to avoid overcorrection.  The first 24 hours our goal will be to correct 10mEq  Total correction should require 489 mEq of sodium.  We will stop hydrochlorothiazide for now.  As far as workup, we will check TSH, a urine for sodium as well as osmolalities.   2. Hypertension, hold hydrochlorothiazide as stated above.  Continue with Norvasc and Toprol. 3. Hypokalemia.  We will replace potassium, goal of 4 to 5.   4. Hypomagnesemia.  We will replace goal to 2.   5. Hypothyroidism unspecified.  Continue with Synthroid, check TSH as stated above.   6. Deep venous thrombosis prophylaxis with heparin  subcutaneous.   CODE STATUS: The patient is full code.   TIME SPENT: 45 minutes.  Of note, she is also on Cipro.  We will continue the course for a previously diagnosed UTI.     ____________________________ Cletis Athensavid K. Ilani Otterson, MD dkh:by D: 02/14/2014 20:50:00 ET T: 02/14/2014 21:03:08 ET JOB#: [id number]  cc: Cletis Athensavid K. Xayden Linsey, MD, <Dictator> Lesta Limbert Synetta ShadowK Ruchel Brandenburger MD ELECTRONICALLY SIGNED 02/20/2014 20:37

## 2014-06-10 NOTE — Discharge Summary (Signed)
PATIENT NAME:  [name], [name] MR#:  [id number] DATE OF BIRTH:  10-23-1922  DATE OF ADMISSION:  02/14/2014 DATE OF DISCHARGE:  02/19/2014  DISCHARGE/TRANSFER SUMMARY  TYPE OF DISCHARGE: The patient is transferred to a skilled nursing facility.   REASON FOR ADMISSION: Weakness.   HISTORY OF PRESENT ILLNESS: The patient is an 81 year old female with a history of coronary artery disease, hypertension, and hyperlipidemia, who presented to the Emergency Room with gradual weakness and failure to thrive, mild nausea and vomiting, recent urinary tract infection noted. She was found to be hyponatremic in the Emergency Room and was admitted for further evaluation.   PAST MEDICAL HISTORY:  1.  ASCVD status post CABG.  2.  Benign hypertension.  3.  Hyperlipidemia.  4.  Hypothyroidism.   MEDICATIONS ON ADMISSION: Please see admission note.   ALLERGIES: IODINE, TETANUS, AND DIPHTHERIA TOXOID.   SOCIAL HISTORY: Negative for alcohol or tobacco abuse.   FAMILY HISTORY: Positive for hypertension and COPD.   REVIEW OF SYSTEMS: As per admission note.   PHYSICAL EXAMINATION:  GENERAL: The patient was weak, in no acute distress.  VITAL SIGNS: Stable, and she was afebrile.  HEENT: Unremarkable.  NECK: Supple without JVD.  LUNGS: Clear.  CARDIAC: Regular rate and rhythm. Normal S1, S2.  ABDOMEN: Soft and nontender.  EXTREMITIES: Revealed 1+ edema.  NEUROLOGIC: Grossly nonfocal.   LABORATORY DATA: Remarkable for a sodium of 122 and a potassium of 2.7.   HOSPITAL COURSE: The patient was admitted with weakness and hyponatremia. She was taken off diuretics and was given IV fluids with potassium supplementation. Her electrolytes improved, as did her mild dehydration and renal insufficiency. She had multiple complaints of arthralgias. X-rays were nondiagnostic. She remained stable hemodynamically with normal blood pressure and heart rate. No hypoxia. She developed confusion in the hospital. Workup was  negative. She was treated through with Xanax for this with good improvement. She was extremely weak. She was seen by physical therapy, who recommended placement. She is now transferred to the skilled nursing facility for further care and rehabilitation.   DISCHARGE DIAGNOSES:  1.  Hyponatremia.  2.  Generalized weakness.  3.  Atherosclerotic coronary vascular disease.  4.  Benign hypertension.  5.  Hypokalemia.  6.  Hypothyroidism.  7.  Senile dementia.   DISCHARGE MEDICATIONS:  1.  DuoNeb SVNs q.i.d.  2.  Aspirin 81 mg p.o. daily.  3.  Norvasc 2.5 mg p.o. daily.  4.  Synthroid 75 mcg p.o. daily.  5.  Toprol-XL 50 mg p.o. daily.  6.  Zofran 4 mg p.o. q. 4 hours p.r.n. nausea and vomiting.  7.  Protonix 40 mg p.o. daily.  8.  Zocor 40 mg p.o. at bedtime.  9.  Carafate 1 g p.o. q. a.m.  10.  Norco 055/325, 1 p.o. q. 4 hours p.r.n. pain.  11.  Colace 100 mg p.o. b.i.d.  12.  Levaquin 250 mg p.o. daily x 1 week.  13.  Xanax 1 mg p.o. q. 6 hours p.r.n. agitation/anxiety.  14.  Hydrochlorothiazide 12.5 mg p.o. daily.  15.  K-Dur 20 mEq p.o. daily.   FOLLOWUP PLANS AND APPOINTMENTS: The patient will be followed by the resident physician at the skilled nursing facility. She is a No Code Blue, Do Not Resuscitate. She is on a  2 g sodium diet with Ensure supplements b.i.d. She will be seen in consultation by physical therapy. We will obtain a CBC and a MET-B in 1 week.    ____________________________ Leonie Douglas.  Doy Hutching, MD jds:MT D: 02/19/2014 07:42:15 ET T: 02/19/2014 08:13:27 ET JOB#: [id number]  cc: Leonie Douglas. Doy Hutching, MD, <Dictator> JEFFREY Lennice Sites MD ELECTRONICALLY SIGNED 02/19/2014 21:24

## 2014-06-12 DIAGNOSIS — M545 Low back pain: Secondary | ICD-10-CM | POA: Diagnosis not present

## 2014-06-12 DIAGNOSIS — M533 Sacrococcygeal disorders, not elsewhere classified: Secondary | ICD-10-CM | POA: Diagnosis not present

## 2014-06-12 DIAGNOSIS — M1612 Unilateral primary osteoarthritis, left hip: Secondary | ICD-10-CM | POA: Diagnosis not present

## 2014-06-12 DIAGNOSIS — M1990 Unspecified osteoarthritis, unspecified site: Secondary | ICD-10-CM | POA: Diagnosis not present

## 2014-06-12 DIAGNOSIS — I2510 Atherosclerotic heart disease of native coronary artery without angina pectoris: Secondary | ICD-10-CM | POA: Diagnosis not present

## 2014-06-12 DIAGNOSIS — G894 Chronic pain syndrome: Secondary | ICD-10-CM | POA: Diagnosis not present

## 2014-07-05 ENCOUNTER — Emergency Department
Admission: EM | Admit: 2014-07-05 | Discharge: 2014-07-05 | Disposition: A | Discharge status: Home / Self Care | Payer: Commercial Managed Care - HMO | Attending: Emergency Medicine | Admitting: Emergency Medicine

## 2014-07-05 ENCOUNTER — Encounter: Payer: Self-pay | Admitting: Emergency Medicine

## 2014-07-05 ENCOUNTER — Emergency Department: Payer: Commercial Managed Care - HMO

## 2014-07-05 DIAGNOSIS — Y9389 Activity, other specified: Secondary | ICD-10-CM | POA: Diagnosis not present

## 2014-07-05 DIAGNOSIS — W19XXXA Unspecified fall, initial encounter: Secondary | ICD-10-CM

## 2014-07-05 DIAGNOSIS — M542 Cervicalgia: Secondary | ICD-10-CM | POA: Diagnosis not present

## 2014-07-05 DIAGNOSIS — Z79899 Other long term (current) drug therapy: Secondary | ICD-10-CM | POA: Insufficient documentation

## 2014-07-05 DIAGNOSIS — S0993XA Unspecified injury of face, initial encounter: Secondary | ICD-10-CM | POA: Diagnosis not present

## 2014-07-05 DIAGNOSIS — S0190XA Unspecified open wound of unspecified part of head, initial encounter: Secondary | ICD-10-CM | POA: Diagnosis not present

## 2014-07-05 DIAGNOSIS — S022XXA Fracture of nasal bones, initial encounter for closed fracture: Secondary | ICD-10-CM | POA: Diagnosis not present

## 2014-07-05 DIAGNOSIS — W01198A Fall on same level from slipping, tripping and stumbling with subsequent striking against other object, initial encounter: Secondary | ICD-10-CM | POA: Insufficient documentation

## 2014-07-05 DIAGNOSIS — Y998 Other external cause status: Secondary | ICD-10-CM | POA: Diagnosis not present

## 2014-07-05 DIAGNOSIS — S299XXA Unspecified injury of thorax, initial encounter: Secondary | ICD-10-CM | POA: Diagnosis not present

## 2014-07-05 DIAGNOSIS — Z7982 Long term (current) use of aspirin: Secondary | ICD-10-CM | POA: Insufficient documentation

## 2014-07-05 DIAGNOSIS — S0990XA Unspecified injury of head, initial encounter: Secondary | ICD-10-CM | POA: Diagnosis present

## 2014-07-05 DIAGNOSIS — I10 Essential (primary) hypertension: Secondary | ICD-10-CM | POA: Diagnosis not present

## 2014-07-05 DIAGNOSIS — Y9289 Other specified places as the place of occurrence of the external cause: Secondary | ICD-10-CM | POA: Insufficient documentation

## 2014-07-05 DIAGNOSIS — S199XXA Unspecified injury of neck, initial encounter: Secondary | ICD-10-CM | POA: Diagnosis not present

## 2014-07-05 DIAGNOSIS — S0003XA Contusion of scalp, initial encounter: Secondary | ICD-10-CM | POA: Diagnosis not present

## 2014-07-05 DIAGNOSIS — M25512 Pain in left shoulder: Secondary | ICD-10-CM | POA: Diagnosis not present

## 2014-07-05 DIAGNOSIS — R51 Headache: Secondary | ICD-10-CM | POA: Diagnosis not present

## 2014-07-05 MED ORDER — ACETAMINOPHEN 325 MG PO TABS: 650.0000 mg | ORAL_TABLET | Freq: Four times a day (QID) | ORAL | Status: DC | PRN

## 2014-07-05 MED ORDER — ACETAMINOPHEN 325 MG PO TABS
650.0000 mg | ORAL_TABLET | Freq: Once | ORAL | Status: AC
  Administered 2014-07-05: 650 mg via ORAL

## 2014-07-05 MED ORDER — ACETAMINOPHEN 325 MG PO TABS
ORAL_TABLET | ORAL | Status: AC
  Filled 2014-07-05: qty 2

## 2014-07-05 NOTE — ED Notes (Signed)
Pt to ed via ems with reports of trying to kill a spider this am and fell hitting her forehead and nose. Pt a/ox3 on arrival to ed with hematoma noted to forehead and abrasions noted to nose. FROM noted to left arm and other extremities. No acute distress noted.

## 2014-07-05 NOTE — Discharge Instructions (Signed)
You have hematoma on your forehead and a broken nose. Please apply ice on it daily for 2 days.   Take tylenol 650 mg every 6 hrs as needed for pain.   Follow up with your doctor.   Return to ER if you have severe headaches, vomiting, severe pain.

## 2014-07-05 NOTE — ED Notes (Signed)
Report called to Annabelle Harmanana RN at  [facility] that pt will be coming back to facility via daughter in private vehicle.

## 2014-07-05 NOTE — ED Notes (Signed)
Pt resting at this time with family at bedside. Awaiting xray results.

## 2014-07-05 NOTE — ED Provider Notes (Signed)
CSN: [id number]     Arrival date & time 07/05/14  0730 History   First MD Initiated Contact with Patient 07/05/14 [id number]     Chief Complaint  Patient presents with  . Fall     (Consider location/radiation/quality/duration/timing/severity/associated sxs/prior Treatment) The history is provided by the patient.  [name] is a 81 y.o. female hx of HL, HTN, here with fall. Patient was attempting to kill a spider in her bathroom this morning and slipped and fell forward and landed on her forehead. Has some minimal headaches afterwards and was noted to have a hematoma on the forehead by EMS. Denies passing out of syncope or chest pain. She is not on blood thinners. Has some mild left shoulder pain but denies any other extremity injuries.   Past Medical History  Diagnosis Date  . Hyperlipidemia   . Hypertension   . Hypothyroidism   . GERD (gastroesophageal reflux disease)   . Pneumonia   . Lymphedema    Past Surgical History  Procedure Laterality Date  . Cholecystectomy    . Abdominal hysterectomy    . Coronary artery bypass graft     Family History  Problem Relation Age of Onset  . Family history unknown: Yes   History  Substance Use Topics  . Smoking status: Never Smoker   . Smokeless tobacco: Never Used  . Alcohol Use: No   OB History    No data available     Review of Systems  Neurological: Positive for headaches.  All other systems reviewed and are negative.     Allergies  Iodine  Home Medications   Prior to Admission medications   Medication Sig Start Date End Date Taking? Authorizing Provider  acetaminophen (TYLENOL) 500 MG tablet Take 500 mg by mouth every 4 (four) hours as needed for mild pain, fever or headache.   Yes Historical Provider, MD  ALPRAZolam Prudy Feeler) 1 MG tablet Take 1 mg by mouth at bedtime. For anxiety   Yes Historical Provider, MD  alum & mag hydroxide-simeth (MAALOX/MYLANTA) 200-200-20 MG/5ML suspension Take 30 mLs by mouth every 6  (six) hours as needed for indigestion or heartburn.   Yes Historical Provider, MD  amLODipine (NORVASC) 2.5 MG tablet Take 2.5 mg by mouth every morning.    Yes Historical Provider, MD  aspirin 81 MG EC tablet Take 81 mg by mouth daily.     Yes Historical Provider, MD  docusate sodium (COLACE) 100 MG capsule Take 100 mg by mouth daily as needed for mild constipation. Daily at bedtime   Yes Historical Provider, MD  docusate sodium (COLACE) 100 MG capsule Take 100 mg by mouth daily. At 4 pm   Yes Historical Provider, MD  gabapentin (NEURONTIN) 100 MG capsule Take 100 mg by mouth every 8 (eight) hours.    Yes Historical Provider, MD  guaifenesin (ROBITUSSIN) 100 MG/5ML syrup Take 200 mg by mouth every 6 (six) hours as needed for cough.   Yes Historical Provider, MD  HYDROcodone-acetaminophen (NORCO/VICODIN) 5-325 MG per tablet Take 1 tablet by mouth 2 (two) times daily as needed for moderate pain.   Yes Historical Provider, MD  levothyroxine (SYNTHROID, LEVOTHROID) 75 MCG tablet Take 75 mcg by mouth every morning.    Yes Historical Provider, MD  loperamide (IMODIUM) 2 MG capsule Take 2 mg by mouth as needed for diarrhea or loose stools.   Yes Historical Provider, MD  magnesium hydroxide (MILK OF MAGNESIA) 400 MG/5ML suspension Take 30 mLs by mouth daily as needed for  mild constipation.   Yes Historical Provider, MD  metoprolol succinate (TOPROL-XL) 50 MG 24 hr tablet Take 50 mg by mouth daily.   Yes Historical Provider, MD  mirtazapine (REMERON) 7.5 MG tablet Take 7.5 mg by mouth at bedtime.   Yes Historical Provider, MD  neomycin-bacitracin-polymyxin (NEOSPORIN) [phone] ointment Apply 1 application topically as needed (skin tears, abrasions, or minor irritations).   Yes Historical Provider, MD  pantoprazole (PROTONIX) 40 MG tablet Take 40 mg by mouth daily.   Yes Historical Provider, MD  senna-docusate (SENOKOT-S) 8.6-50 MG per tablet Take 1 tablet by mouth 2 (two) times daily as needed for mild  constipation.   Yes Historical Provider, MD  simvastatin (ZOCOR) 40 MG tablet Take 40 mg by mouth at bedtime.     Yes Historical Provider, MD  sucralfate (CARAFATE) 1 G tablet Take 1 g by mouth daily.    Yes Historical Provider, MD  torsemide (DEMADEX) 10 MG tablet Take 10 mg by mouth daily.   Yes Historical Provider, MD  vitamin B-12 (CYANOCOBALAMIN) 1000 MCG tablet Take 1,000 mcg by mouth every morning.    Yes Historical Provider, MD   BP 140/73 mmHg  Pulse 70  Temp(Src) 97.9 F (36.6 C) (Oral)  Resp 20  Ht  (1.575 m)  Wt 126 lb (57.153 kg)  BMI 23.04 kg/m2  SpO2 100% Physical Exam  Constitutional: She is oriented to person, place, and time. She appears well-developed.  Slightly uncomfortable   HENT:  Head: Normocephalic.  Small forehead hematoma. Mild nasal bone tenderness and swelling. No obvious septal hematoma   Eyes: Conjunctivae and EOM are normal. Pupils are equal, round, and reactive to light.  Neck: Normal range of motion. Neck supple.  Cardiovascular: Normal rate, regular rhythm and normal heart sounds.   Pulmonary/Chest: Effort normal and breath sounds normal. No respiratory distress. She has no wheezes. She has no rales.  Abdominal: Soft. Bowel sounds are normal. She exhibits no distension. There is no tenderness. There is no rebound.  Musculoskeletal: Normal range of motion.  Nl L shoulder ROM. NL ROM bilateral hips. Pelvis stable. No other obvious extremity trauma   Neurological: She is alert and oriented to person, place, and time. No cranial nerve deficit. Coordination normal.  Skin: Skin is warm and dry.  Psychiatric: She has a normal mood and affect. Her behavior is normal. Judgment and thought content normal.  Nursing note and vitals reviewed.   ED Course  Procedures (including critical care time) Labs Review Labs Reviewed - No data to display  Imaging Review Dg Chest 2 View  07/05/2014   CLINICAL DATA:  Recent fall with left shoulder pain and  abnormal breath sounds on physical exam  EXAM: CHEST  2 VIEW  COMPARISON:  02/14/2014  FINDINGS: Cardiac shadow is stable. Postsurgical changes are again noted. A calcified granuloma is seen in the left lung base and stable. The lungs are well aerated without focal infiltrate.  IMPRESSION: No active cardiopulmonary disease.   Electronically Signed   By: Alcide Clever M.D.   On: 07/05/2014 08:07   Ct Head Wo Contrast  07/05/2014   CLINICAL DATA:  Recent fall while trying to kill spider with headaches, facial and neck pain  EXAM: CT HEAD WITHOUT CONTRAST  CT MAXILLOFACIAL WITHOUT CONTRAST  CT CERVICAL SPINE WITHOUT CONTRAST  TECHNIQUE: Multidetector CT imaging of the head, cervical spine, and maxillofacial structures were performed using the standard protocol without intravenous contrast. Multiplanar CT image reconstructions of the cervical spine and  maxillofacial structures were also generated.  COMPARISON:  02/14/2014  FINDINGS: CT HEAD FINDINGS  The bony calvarium is intact. A frontal scalp hematoma is identified consistent with the recent injury. Mild atrophic and chronic white matter ischemic changes are noted. The overall appearance is similar to that seen on the prior exam. No acute hemorrhage, acute infarction or space-occupying mass lesion is identified.  CT MAXILLOFACIAL FINDINGS  Frontal scalp hematoma is again seen. Mild irregularity of the nasal bone is noted on the right which may represent undisplaced fracture. No significant soft tissue changes are noted however. The orbits and their contents are within normal limits. The paranasal sinuses are well aerated. No other soft tissue or bony abnormality is seen.  CT CERVICAL SPINE FINDINGS  Seven cervical segments are well visualized. Vertebral body height is well maintained. Disc space narrowing is noted at C6-7 with associated osteophytic changes. Bilateral facet hypertrophic changes are seen. No acute fracture or acute facet abnormality is noted. The  surrounding soft tissue structures show carotid calcifications. No other focal abnormality is seen. The visualized lung apices are unremarkable.  IMPRESSION: CT of the head: Chronic atrophic and ischemic changes without acute abnormality.  Frontal scalp hematoma.  CT of the maxillofacial bones: Mild irregularity of the nasal bone on the right which may represent a nondisplaced fracture. No associated soft tissue abnormality is noted however. These changes may be chronic in nature.  CT of the cervical spine: Multilevel degenerative changes without acute abnormality.   Electronically Signed   By: Alcide Clever M.D.   On: 07/05/2014 08:44   Ct Cervical Spine Wo Contrast  07/05/2014   CLINICAL DATA:  Recent fall while trying to kill spider with headaches, facial and neck pain  EXAM: CT HEAD WITHOUT CONTRAST  CT MAXILLOFACIAL WITHOUT CONTRAST  CT CERVICAL SPINE WITHOUT CONTRAST  TECHNIQUE: Multidetector CT imaging of the head, cervical spine, and maxillofacial structures were performed using the standard protocol without intravenous contrast. Multiplanar CT image reconstructions of the cervical spine and maxillofacial structures were also generated.  COMPARISON:  02/14/2014  FINDINGS: CT HEAD FINDINGS  The bony calvarium is intact. A frontal scalp hematoma is identified consistent with the recent injury. Mild atrophic and chronic white matter ischemic changes are noted. The overall appearance is similar to that seen on the prior exam. No acute hemorrhage, acute infarction or space-occupying mass lesion is identified.  CT MAXILLOFACIAL FINDINGS  Frontal scalp hematoma is again seen. Mild irregularity of the nasal bone is noted on the right which may represent undisplaced fracture. No significant soft tissue changes are noted however. The orbits and their contents are within normal limits. The paranasal sinuses are well aerated. No other soft tissue or bony abnormality is seen.  CT CERVICAL SPINE FINDINGS  Seven cervical  segments are well visualized. Vertebral body height is well maintained. Disc space narrowing is noted at C6-7 with associated osteophytic changes. Bilateral facet hypertrophic changes are seen. No acute fracture or acute facet abnormality is noted. The surrounding soft tissue structures show carotid calcifications. No other focal abnormality is seen. The visualized lung apices are unremarkable.  IMPRESSION: CT of the head: Chronic atrophic and ischemic changes without acute abnormality.  Frontal scalp hematoma.  CT of the maxillofacial bones: Mild irregularity of the nasal bone on the right which may represent a nondisplaced fracture. No associated soft tissue abnormality is noted however. These changes may be chronic in nature.  CT of the cervical spine: Multilevel degenerative changes without acute abnormality.  Electronically Signed   By: Alcide CleverMark  Lukens M.D.   On: 07/05/2014 08:44   Ct Maxillofacial Wo Cm  07/05/2014   CLINICAL DATA:  Recent fall while trying to kill spider with headaches, facial and neck pain  EXAM: CT HEAD WITHOUT CONTRAST  CT MAXILLOFACIAL WITHOUT CONTRAST  CT CERVICAL SPINE WITHOUT CONTRAST  TECHNIQUE: Multidetector CT imaging of the head, cervical spine, and maxillofacial structures were performed using the standard protocol without intravenous contrast. Multiplanar CT image reconstructions of the cervical spine and maxillofacial structures were also generated.  COMPARISON:  02/14/2014  FINDINGS: CT HEAD FINDINGS  The bony calvarium is intact. A frontal scalp hematoma is identified consistent with the recent injury. Mild atrophic and chronic white matter ischemic changes are noted. The overall appearance is similar to that seen on the prior exam. No acute hemorrhage, acute infarction or space-occupying mass lesion is identified.  CT MAXILLOFACIAL FINDINGS  Frontal scalp hematoma is again seen. Mild irregularity of the nasal bone is noted on the right which may represent undisplaced  fracture. No significant soft tissue changes are noted however. The orbits and their contents are within normal limits. The paranasal sinuses are well aerated. No other soft tissue or bony abnormality is seen.  CT CERVICAL SPINE FINDINGS  Seven cervical segments are well visualized. Vertebral body height is well maintained. Disc space narrowing is noted at C6-7 with associated osteophytic changes. Bilateral facet hypertrophic changes are seen. No acute fracture or acute facet abnormality is noted. The surrounding soft tissue structures show carotid calcifications. No other focal abnormality is seen. The visualized lung apices are unremarkable.  IMPRESSION: CT of the head: Chronic atrophic and ischemic changes without acute abnormality.  Frontal scalp hematoma.  CT of the maxillofacial bones: Mild irregularity of the nasal bone on the right which may represent a nondisplaced fracture. No associated soft tissue abnormality is noted however. These changes may be chronic in nature.  CT of the cervical spine: Multilevel degenerative changes without acute abnormality.   Electronically Signed   By: Alcide CleverMark  Lukens M.D.   On: 07/05/2014 08:44     EKG Interpretation None      MDM   Final diagnoses:  Fall   [name] is a 81 y.o. female here with fall. Will do CT head/neck/face. Will get xray.   8:51 AM CT head unremarkable. CT face for possible nondisplaced nasal fracture. Recommend tylenol prn. cxr unremarkable. Stable for dc back to facility.    Richardean Canalavid H Yao, MD 07/05/14 [phone]

## 2014-07-11 DIAGNOSIS — E039 Hypothyroidism, unspecified: Secondary | ICD-10-CM | POA: Diagnosis not present

## 2014-07-11 DIAGNOSIS — Z79899 Other long term (current) drug therapy: Secondary | ICD-10-CM | POA: Diagnosis not present

## 2014-07-11 DIAGNOSIS — I10 Essential (primary) hypertension: Secondary | ICD-10-CM | POA: Diagnosis not present

## 2014-07-11 DIAGNOSIS — E780 Pure hypercholesterolemia: Secondary | ICD-10-CM | POA: Diagnosis not present

## 2014-07-17 DIAGNOSIS — E039 Hypothyroidism, unspecified: Secondary | ICD-10-CM | POA: Diagnosis not present

## 2014-07-17 DIAGNOSIS — Z87440 Personal history of urinary (tract) infections: Secondary | ICD-10-CM | POA: Diagnosis not present

## 2014-07-17 DIAGNOSIS — I10 Essential (primary) hypertension: Secondary | ICD-10-CM | POA: Diagnosis not present

## 2014-07-17 DIAGNOSIS — E780 Pure hypercholesterolemia: Secondary | ICD-10-CM | POA: Diagnosis not present

## 2014-08-21 DIAGNOSIS — N390 Urinary tract infection, site not specified: Secondary | ICD-10-CM | POA: Diagnosis not present

## 2014-08-21 DIAGNOSIS — I10 Essential (primary) hypertension: Secondary | ICD-10-CM | POA: Diagnosis not present

## 2014-08-21 DIAGNOSIS — M25511 Pain in right shoulder: Secondary | ICD-10-CM | POA: Diagnosis not present

## 2014-08-21 DIAGNOSIS — N183 Chronic kidney disease, stage 3 (moderate): Secondary | ICD-10-CM | POA: Diagnosis not present

## 2014-08-21 DIAGNOSIS — D631 Anemia in chronic kidney disease: Secondary | ICD-10-CM | POA: Diagnosis not present

## 2014-08-21 DIAGNOSIS — Z87440 Personal history of urinary (tract) infections: Secondary | ICD-10-CM | POA: Diagnosis not present

## 2014-08-27 DIAGNOSIS — I10 Essential (primary) hypertension: Secondary | ICD-10-CM | POA: Diagnosis not present

## 2014-08-27 DIAGNOSIS — D631 Anemia in chronic kidney disease: Secondary | ICD-10-CM | POA: Diagnosis not present

## 2014-08-27 DIAGNOSIS — Z87440 Personal history of urinary (tract) infections: Secondary | ICD-10-CM | POA: Diagnosis not present

## 2014-08-27 DIAGNOSIS — N183 Chronic kidney disease, stage 3 (moderate): Secondary | ICD-10-CM | POA: Diagnosis not present

## 2014-08-27 DIAGNOSIS — M25511 Pain in right shoulder: Secondary | ICD-10-CM | POA: Diagnosis not present

## 2014-08-30 ENCOUNTER — Other Ambulatory Visit: Payer: Self-pay | Admitting: Internal Medicine

## 2014-08-30 ENCOUNTER — Ambulatory Visit
Admission: RE | Admit: 2014-08-30 | Discharge: 2014-08-30 | Disposition: A | Discharge status: Home / Self Care | Payer: Commercial Managed Care - HMO | Source: Ambulatory Visit | Attending: Internal Medicine | Admitting: Internal Medicine

## 2014-08-30 DIAGNOSIS — M79651 Pain in right thigh: Secondary | ICD-10-CM | POA: Diagnosis not present

## 2014-08-30 DIAGNOSIS — M79604 Pain in right leg: Secondary | ICD-10-CM | POA: Diagnosis not present

## 2014-09-14 ENCOUNTER — Telehealth: Payer: Self-pay | Admitting: Cardiovascular Disease

## 2014-09-14 NOTE — Telephone Encounter (Signed)
Pain in same leg she had  Veins removed for bypass.    Pain starts on inner leg (groin) goes all the way down to ankle.  No increased swelling no redness.  Patient has recently had u/s and venous duplex that daughter said were negative.   Patient continues to have pain infact pain started radiating past knee and to ankle after the tests were done.  Daughter wants to know if she should come see Dr. Mariah Milling.

## 2014-09-14 NOTE — Telephone Encounter (Signed)
Attempted to contact [name].   Line busy x 3.

## 2014-09-17 NOTE — Telephone Encounter (Signed)
Dose of hydrocodone was recently increased, but pt still c/o pain.  She is concerned that pt is on so much pain med. Advised her to contact Dr. [name]' office, as DVT has been r/o. She verbalizes understanding and will call back if we can be of further assistance.

## 2014-09-17 NOTE — Telephone Encounter (Signed)
Spoke w/ [name].  She reports that pt is continuing to c/o pain down her leg where vein was removed.  Reports that pt has been taking hydrocodone

## 2014-09-18 ENCOUNTER — Other Ambulatory Visit: Payer: Self-pay | Admitting: Internal Medicine

## 2014-09-18 DIAGNOSIS — D638 Anemia in other chronic diseases classified elsewhere: Secondary | ICD-10-CM | POA: Diagnosis not present

## 2014-09-18 DIAGNOSIS — M79604 Pain in right leg: Secondary | ICD-10-CM

## 2014-09-18 DIAGNOSIS — R6889 Other general symptoms and signs: Secondary | ICD-10-CM | POA: Diagnosis not present

## 2014-09-22 ENCOUNTER — Ambulatory Visit: Payer: Commercial Managed Care - HMO

## 2014-09-22 ENCOUNTER — Inpatient Hospital Stay
Admission: EM | Admit: 2014-09-22 | Discharge: 2014-09-24 | DRG: 866 | Payer: Commercial Managed Care - HMO | Attending: Internal Medicine | Admitting: Internal Medicine

## 2014-09-22 ENCOUNTER — Emergency Department: Payer: Commercial Managed Care - HMO

## 2014-09-22 ENCOUNTER — Encounter: Payer: Self-pay | Admitting: Emergency Medicine

## 2014-09-22 ENCOUNTER — Inpatient Hospital Stay: Payer: Commercial Managed Care - HMO

## 2014-09-22 DIAGNOSIS — J189 Pneumonia, unspecified organism: Secondary | ICD-10-CM | POA: Diagnosis not present

## 2014-09-22 DIAGNOSIS — K219 Gastro-esophageal reflux disease without esophagitis: Secondary | ICD-10-CM | POA: Diagnosis not present

## 2014-09-22 DIAGNOSIS — S76311A Strain of muscle, fascia and tendon of the posterior muscle group at thigh level, right thigh, initial encounter: Secondary | ICD-10-CM | POA: Diagnosis not present

## 2014-09-22 DIAGNOSIS — I2510 Atherosclerotic heart disease of native coronary artery without angina pectoris: Secondary | ICD-10-CM | POA: Diagnosis not present

## 2014-09-22 DIAGNOSIS — R531 Weakness: Secondary | ICD-10-CM | POA: Diagnosis not present

## 2014-09-22 DIAGNOSIS — E039 Hypothyroidism, unspecified: Secondary | ICD-10-CM | POA: Diagnosis present

## 2014-09-22 DIAGNOSIS — E872 Acidosis, unspecified: Secondary | ICD-10-CM | POA: Diagnosis present

## 2014-09-22 DIAGNOSIS — E785 Hyperlipidemia, unspecified: Secondary | ICD-10-CM | POA: Diagnosis not present

## 2014-09-22 DIAGNOSIS — N179 Acute kidney failure, unspecified: Secondary | ICD-10-CM | POA: Diagnosis not present

## 2014-09-22 DIAGNOSIS — R52 Pain, unspecified: Secondary | ICD-10-CM

## 2014-09-22 DIAGNOSIS — W1830XA Fall on same level, unspecified, initial encounter: Secondary | ICD-10-CM | POA: Diagnosis present

## 2014-09-22 DIAGNOSIS — Z66 Do not resuscitate: Secondary | ICD-10-CM | POA: Diagnosis present

## 2014-09-22 DIAGNOSIS — M25551 Pain in right hip: Secondary | ICD-10-CM | POA: Diagnosis present

## 2014-09-22 DIAGNOSIS — Z951 Presence of aortocoronary bypass graft: Secondary | ICD-10-CM

## 2014-09-22 DIAGNOSIS — Y95 Nosocomial condition: Secondary | ICD-10-CM | POA: Diagnosis not present

## 2014-09-22 DIAGNOSIS — B349 Viral infection, unspecified: Secondary | ICD-10-CM | POA: Diagnosis not present

## 2014-09-22 DIAGNOSIS — I10 Essential (primary) hypertension: Secondary | ICD-10-CM | POA: Diagnosis not present

## 2014-09-22 DIAGNOSIS — Z7982 Long term (current) use of aspirin: Secondary | ICD-10-CM | POA: Diagnosis not present

## 2014-09-22 DIAGNOSIS — R0902 Hypoxemia: Secondary | ICD-10-CM | POA: Diagnosis present

## 2014-09-22 DIAGNOSIS — W19XXXA Unspecified fall, initial encounter: Secondary | ICD-10-CM | POA: Diagnosis not present

## 2014-09-22 DIAGNOSIS — A419 Sepsis, unspecified organism: Secondary | ICD-10-CM | POA: Diagnosis not present

## 2014-09-22 DIAGNOSIS — R509 Fever, unspecified: Secondary | ICD-10-CM

## 2014-09-22 LAB — COMPREHENSIVE METABOLIC PANEL
ALT: 16 U/L (ref 14–54)
AST: 35 U/L (ref 15–41)
Albumin: 4.1 g/dL (ref 3.5–5.0)
Alkaline Phosphatase: 74 U/L (ref 38–126)
Anion gap: 12 (ref 5–15)
BUN: 23 mg/dL — ABNORMAL HIGH (ref 6–20)
CALCIUM: 9.8 mg/dL (ref 8.9–10.3)
CHLORIDE: 99 mmol/L — AB (ref 101–111)
CO2: 25 mmol/L (ref 22–32)
Creatinine, Ser: 1.55 mg/dL — ABNORMAL HIGH (ref 0.44–1.00)
GFR calc Af Amer: 33 mL/min — ABNORMAL LOW (ref >60)
GFR calc non Af Amer: 29 mL/min — ABNORMAL LOW (ref >60)
GLUCOSE: 95 mg/dL (ref 65–99)
POTASSIUM: 3.8 mmol/L (ref 3.5–5.1)
SODIUM: 136 mmol/L (ref 135–145)
Total Bilirubin: 0.4 mg/dL (ref 0.3–1.2)
Total Protein: 7.5 g/dL (ref 6.5–8.1)

## 2014-09-22 LAB — URINALYSIS COMPLETE WITH MICROSCOPIC (ARMC ONLY)
BILIRUBIN URINE: NEGATIVE
Bacteria, UA: NONE SEEN
Glucose, UA: NEGATIVE mg/dL
HGB URINE DIPSTICK: NEGATIVE
KETONES UR: NEGATIVE mg/dL
LEUKOCYTES UA: NEGATIVE
Nitrite: NEGATIVE
Protein, ur: NEGATIVE mg/dL
Specific Gravity, Urine: 1.01 (ref 1.005–1.030)
pH: 6 (ref 5.0–8.0)

## 2014-09-22 LAB — CBC WITH DIFFERENTIAL/PLATELET
Basophils Absolute: 0 10*3/uL (ref 0–0.1)
Basophils Relative: 0 %
EOS PCT: 0 %
Eosinophils Absolute: 0 10*3/uL (ref 0–0.7)
HCT: 32.1 % — ABNORMAL LOW (ref 35.0–47.0)
Hemoglobin: 10.8 g/dL — ABNORMAL LOW (ref 12.0–16.0)
Lymphocytes Relative: 5 %
Lymphs Abs: 0.7 10*3/uL — ABNORMAL LOW (ref 1.0–3.6)
MCH: 30.3 pg (ref 26.0–34.0)
MCHC: 33.6 g/dL (ref 32.0–36.0)
MCV: 90.1 fL (ref 80.0–100.0)
MONO ABS: 1 10*3/uL — AB (ref 0.2–0.9)
Monocytes Relative: 8 %
NEUTROS ABS: 10.8 10*3/uL — AB (ref 1.4–6.5)
NEUTROS PCT: 87 %
PLATELETS: 175 10*3/uL (ref 150–440)
RBC: 3.56 MIL/uL — AB (ref 3.80–5.20)
RDW: 12.9 % (ref 11.5–14.5)
WBC: 12.6 10*3/uL — ABNORMAL HIGH (ref 3.6–11.0)

## 2014-09-22 LAB — TROPONIN I: Troponin I: <0.03 ng/mL (ref <0.031)

## 2014-09-22 LAB — LACTIC ACID, PLASMA
LACTIC ACID, VENOUS: 0.9 mmol/L (ref 0.5–2.0)
Lactic Acid, Venous: 2.6 mmol/L (ref 0.5–2.0)

## 2014-09-22 LAB — LIPASE, BLOOD: LIPASE: 25 U/L (ref 22–51)

## 2014-09-22 LAB — BRAIN NATRIURETIC PEPTIDE: B Natriuretic Peptide: 263 pg/mL — ABNORMAL HIGH (ref 0.0–100.0)

## 2014-09-22 MED ORDER — HEPARIN SODIUM (PORCINE) 5000 UNIT/ML IJ SOLN
5000.0000 [IU] | Freq: Three times a day (TID) | INTRAMUSCULAR | Status: DC
  Administered 2014-09-22 – 2014-09-24 (×6): 5000 [IU] via SUBCUTANEOUS
  Filled 2014-09-22 (×7): qty 1

## 2014-09-22 MED ORDER — SENNOSIDES-DOCUSATE SODIUM 8.6-50 MG PO TABS: 1.0000 | ORAL_TABLET | Freq: Two times a day (BID) | ORAL | Status: DC | PRN

## 2014-09-22 MED ORDER — LOPERAMIDE HCL 2 MG PO CAPS: 2.0000 mg | ORAL_CAPSULE | ORAL | Status: DC | PRN

## 2014-09-22 MED ORDER — LEVOTHYROXINE SODIUM 100 MCG PO TABS
100.0000 ug | ORAL_TABLET | Freq: Every day | ORAL | Status: DC
  Administered 2014-09-23 – 2014-09-24 (×2): 100 ug via ORAL
  Filled 2014-09-22 (×2): qty 1

## 2014-09-22 MED ORDER — AMLODIPINE BESYLATE 5 MG PO TABS
2.5000 mg | ORAL_TABLET | ORAL | Status: DC
  Administered 2014-09-23 – 2014-09-24 (×2): 2.5 mg via ORAL
  Filled 2014-09-22 (×2): qty 1

## 2014-09-22 MED ORDER — DEXTROSE 5 % IV SOLN
500.0000 mg | Freq: Once | INTRAVENOUS | Status: AC
  Administered 2014-09-22: 500 mg via INTRAVENOUS
  Filled 2014-09-22: qty 500

## 2014-09-22 MED ORDER — METOPROLOL SUCCINATE ER 25 MG PO TB24
50.0000 mg | ORAL_TABLET | Freq: Every day | ORAL | Status: DC
  Administered 2014-09-23 – 2014-09-24 (×2): 50 mg via ORAL
  Filled 2014-09-22 (×2): qty 2

## 2014-09-22 MED ORDER — ASPIRIN 81 MG PO TBEC: 81.0000 mg | DELAYED_RELEASE_TABLET | Freq: Every day | ORAL | Status: DC

## 2014-09-22 MED ORDER — VANCOMYCIN HCL IN DEXTROSE 750-5 MG/150ML-% IV SOLN
750.0000 mg | INTRAVENOUS | Status: DC
  Administered 2014-09-22: 22:00:00 750 mg via INTRAVENOUS
  Filled 2014-09-22 (×2): qty 150

## 2014-09-22 MED ORDER — ALUM & MAG HYDROXIDE-SIMETH 200-200-20 MG/5ML PO SUSP: 30.0000 mL | Freq: Four times a day (QID) | ORAL | Status: DC | PRN

## 2014-09-22 MED ORDER — ACETAMINOPHEN 325 MG PO TABS
650.0000 mg | ORAL_TABLET | Freq: Once | ORAL | Status: AC | PRN
  Administered 2014-09-22: 650 mg via ORAL
  Filled 2014-09-22: qty 2

## 2014-09-22 MED ORDER — DEXTROSE 5 % IV SOLN
1.0000 g | INTRAVENOUS | Status: DC
  Administered 2014-09-22 – 2014-09-23 (×2): 1 g via INTRAVENOUS
  Filled 2014-09-22 (×3): qty 1

## 2014-09-22 MED ORDER — TORSEMIDE 20 MG PO TABS
10.0000 mg | ORAL_TABLET | Freq: Every day | ORAL | Status: DC
  Administered 2014-09-22 – 2014-09-24 (×3): 10 mg via ORAL
  Filled 2014-09-22 (×3): qty 1

## 2014-09-22 MED ORDER — PANTOPRAZOLE SODIUM 40 MG PO TBEC
40.0000 mg | DELAYED_RELEASE_TABLET | Freq: Every day | ORAL | Status: DC
  Administered 2014-09-22 – 2014-09-24 (×3): 40 mg via ORAL
  Filled 2014-09-22 (×3): qty 1

## 2014-09-22 MED ORDER — VITAMIN B-12 1000 MCG PO TABS
1000.0000 ug | ORAL_TABLET | ORAL | Status: DC
  Administered 2014-09-23 – 2014-09-24 (×2): 1000 ug via ORAL
  Filled 2014-09-22 (×2): qty 1

## 2014-09-22 MED ORDER — SIMVASTATIN 40 MG PO TABS
40.0000 mg | ORAL_TABLET | Freq: Every day | ORAL | Status: DC
  Administered 2014-09-22 – 2014-09-23 (×2): 40 mg via ORAL
  Filled 2014-09-22 (×2): qty 1

## 2014-09-22 MED ORDER — SODIUM CHLORIDE 0.9 % IV SOLN
INTRAVENOUS | Status: DC
  Administered 2014-09-22: 21:00:00 via INTRAVENOUS

## 2014-09-22 MED ORDER — MIRTAZAPINE 15 MG PO TABS
7.5000 mg | ORAL_TABLET | Freq: Every day | ORAL | Status: DC
  Administered 2014-09-22 – 2014-09-23 (×2): 7.5 mg via ORAL
  Filled 2014-09-22: qty 1
  Filled 2014-09-22: qty 0.5

## 2014-09-22 MED ORDER — TRIPLE ANTIBIOTIC 5-400-5000 EX OINT: 1.0000 "application " | TOPICAL_OINTMENT | CUTANEOUS | Status: DC | PRN

## 2014-09-22 MED ORDER — DOCUSATE SODIUM 100 MG PO CAPS
100.0000 mg | ORAL_CAPSULE | Freq: Every day | ORAL | Status: DC
  Administered 2014-09-22 – 2014-09-23 (×2): 100 mg via ORAL
  Filled 2014-09-22 (×2): qty 1

## 2014-09-22 MED ORDER — PNEUMOCOCCAL VAC POLYVALENT 25 MCG/0.5ML IJ INJ
0.5000 mL | INJECTION | INTRAMUSCULAR | Status: AC
  Administered 2014-09-23: 07:00:00 0.5 mL via INTRAMUSCULAR
  Filled 2014-09-22: qty 0.5

## 2014-09-22 MED ORDER — ASPIRIN EC 81 MG PO TBEC
81.0000 mg | DELAYED_RELEASE_TABLET | Freq: Every day | ORAL | Status: DC
  Administered 2014-09-22 – 2014-09-24 (×3): 81 mg via ORAL
  Filled 2014-09-22 (×3): qty 1

## 2014-09-22 MED ORDER — ALPRAZOLAM 1 MG PO TABS
1.0000 mg | ORAL_TABLET | Freq: Every day | ORAL | Status: DC
Start: 2014-09-22 — End: 2014-09-24
  Administered 2014-09-22 – 2014-09-23 (×2): 1 mg via ORAL
  Filled 2014-09-22 (×2): qty 1

## 2014-09-22 MED ORDER — GUAIFENESIN 100 MG/5ML PO SYRP: 200.0000 mg | ORAL_SOLUTION | Freq: Four times a day (QID) | ORAL | Status: DC | PRN

## 2014-09-22 MED ORDER — SODIUM CHLORIDE 0.9 % IJ SOLN
3.0000 mL | Freq: Two times a day (BID) | INTRAMUSCULAR | Status: DC
  Administered 2014-09-22 – 2014-09-23 (×4): 3 mL via INTRAVENOUS

## 2014-09-22 MED ORDER — MAGNESIUM HYDROXIDE 400 MG/5ML PO SUSP: 30.0000 mL | Freq: Every day | ORAL | Status: DC | PRN

## 2014-09-22 MED ORDER — HYDROCODONE-ACETAMINOPHEN 10-325 MG PO TABS
1.0000 | ORAL_TABLET | ORAL | Status: DC | PRN
  Administered 2014-09-23: 0.5 via ORAL
  Filled 2014-09-22 (×2): qty 1

## 2014-09-22 MED ORDER — SUCRALFATE 1 G PO TABS
1.0000 g | ORAL_TABLET | Freq: Every day | ORAL | Status: DC
Start: 2014-09-22 — End: 2014-09-24
  Administered 2014-09-22 – 2014-09-24 (×3): 1 g via ORAL
  Filled 2014-09-22 (×3): qty 1

## 2014-09-22 MED ORDER — DEXTROSE 5 % IV SOLN
2.0000 g | Freq: Three times a day (TID) | INTRAVENOUS | Status: DC
  Filled 2014-09-22 (×4): qty 2

## 2014-09-22 MED ORDER — VANCOMYCIN HCL IN DEXTROSE 750-5 MG/150ML-% IV SOLN
750.0000 mg | INTRAVENOUS | Status: AC
  Administered 2014-09-22: 15:00:00 750 mg via INTRAVENOUS
  Filled 2014-09-22: qty 150

## 2014-09-22 MED ORDER — SODIUM CHLORIDE 0.9 % IV BOLUS (SEPSIS)
1000.0000 mL | INTRAVENOUS | Status: AC
  Administered 2014-09-22: 1000 mL via INTRAVENOUS

## 2014-09-22 MED ORDER — GABAPENTIN 100 MG PO CAPS
100.0000 mg | ORAL_CAPSULE | Freq: Three times a day (TID) | ORAL | Status: DC
  Administered 2014-09-22 – 2014-09-24 (×6): 100 mg via ORAL
  Filled 2014-09-22 (×6): qty 1

## 2014-09-22 MED ORDER — DEXTROSE 5 % IV SOLN
1.0000 g | Freq: Once | INTRAVENOUS | Status: AC
  Administered 2014-09-22: 1 g via INTRAVENOUS
  Filled 2014-09-22: qty 10

## 2014-09-22 NOTE — Progress Notes (Signed)
TC page 4th time with MD giving order to hold metoprolol today with restart tomorrow.

## 2014-09-22 NOTE — ED Notes (Signed)
Pt up to bedside commode, ambulated well with assistance.  Pt missed hat and unable to get sample.  MD aware

## 2014-09-22 NOTE — Progress Notes (Signed)
Paged MD on call with out response to advise of nurse hold on metoprolol due to lower values of BP- no return at this time after 3 paged. Will continue to assess for need.

## 2014-09-22 NOTE — ED Provider Notes (Signed)
[facility] Emergency Department Provider Note  ____________________________________________  Time seen: 7 AM  I have reviewed the triage vital signs and the nursing notes.   HISTORY  Chief Complaint Fall and Fever    HPI [name] is a 81 y.o. female who presents after complaining for full. She reports she was straightening her bed and lost her balance and fell backwards. She denies any injury. Her daughter wanted her to be checked out. Patient has an MRI scheduled for chronic pain to her right hip and groin this afternoon. She is able to move all extremity. She denies focal weakness. She denies cough. In triage patient was noted with fever and a slightly tachypnea. Daughter believes she may have a urinary tract infection     Past Medical History  Diagnosis Date  . Hyperlipidemia   . Hypertension   . Hypothyroidism   . GERD (gastroesophageal reflux disease)   . Pneumonia   . Lymphedema     Patient Active Problem List   Diagnosis Date Noted  . Frequent falls 09/06/2013  . Ankle edema 09/30/2012  . CAD, ARTERY BYPASS GRAFT 06/21/2009  . HYPOTHYROIDISM 07/17/2008  . Hyperlipidemia 07/17/2008  . HTN (hypertension) 07/17/2008  . GERD 07/17/2008  . DYSPNEA 07/17/2008  . CHEST PAIN-UNSPECIFIED 07/17/2008    Past Surgical History  Procedure Laterality Date  . Cholecystectomy    . Abdominal hysterectomy    . Coronary artery bypass graft      Current Outpatient Rx  Name  Route  Sig  Dispense  Refill  . acetaminophen (TYLENOL) 500 MG tablet   Oral   Take 500 mg by mouth every 4 (four) hours as needed for mild pain or fever.         . ALPRAZolam (XANAX) 1 MG tablet   Oral   Take 1 mg by mouth at bedtime. For anxiety         . alum & mag hydroxide-simeth (MAALOX/MYLANTA) 200-200-20 MG/5ML suspension   Oral   Take 30 mLs by mouth every 6 (six) hours as needed for indigestion or heartburn.         Marland Kitchen amLODipine (NORVASC) 2.5 MG  tablet   Oral   Take 2.5 mg by mouth every morning.          Marland Kitchen aspirin 81 MG EC tablet   Oral   Take 81 mg by mouth daily.           Marland Kitchen docusate sodium (COLACE) 100 MG capsule   Oral   Take 100 mg by mouth daily. At 4 pm         . gabapentin (NEURONTIN) 100 MG capsule   Oral   Take 100 mg by mouth every 8 (eight) hours.          Marland Kitchen guaifenesin (ROBITUSSIN) 100 MG/5ML syrup   Oral   Take 200 mg by mouth every 6 (six) hours as needed for cough.         Marland Kitchen HYDROcodone-acetaminophen (NORCO) 10-325 MG per tablet   Oral   Take 1 tablet by mouth every 4 (four) hours as needed for moderate pain.         Marland Kitchen levothyroxine (SYNTHROID, LEVOTHROID) 100 MCG tablet   Oral   Take 100 mcg by mouth daily.         Marland Kitchen loperamide (IMODIUM) 2 MG capsule   Oral   Take 2 mg by mouth as needed for diarrhea or loose stools.         Marland Kitchen  magnesium hydroxide (MILK OF MAGNESIA) 400 MG/5ML suspension   Oral   Take 30 mLs by mouth daily as needed for mild constipation.         . metoprolol succinate (TOPROL-XL) 50 MG 24 hr tablet   Oral   Take 50 mg by mouth daily.         . mirtazapine (REMERON) 7.5 MG tablet   Oral   Take 7.5 mg by mouth at bedtime.         Marland Kitchen neomycin-bacitracin-polymyxin (NEOSPORIN) [phone] ointment   Topical   Apply 1 application topically as needed (skin tears, abrasions, or minor irritations).         . pantoprazole (PROTONIX) 40 MG tablet   Oral   Take 40 mg by mouth daily.         Marland Kitchen senna-docusate (SENOKOT-S) 8.6-50 MG per tablet   Oral   Take 1 tablet by mouth 2 (two) times daily as needed for mild constipation.         . simvastatin (ZOCOR) 40 MG tablet   Oral   Take 40 mg by mouth at bedtime.           . sucralfate (CARAFATE) 1 G tablet   Oral   Take 1 g by mouth daily.          Marland Kitchen torsemide (DEMADEX) 10 MG tablet   Oral   Take 10 mg by mouth daily.         . vitamin B-12 (CYANOCOBALAMIN) 1000 MCG tablet   Oral   Take  1,000 mcg by mouth every morning.            Allergies Iodine and Tetanus toxoids  Family History  Problem Relation Age of Onset  . Family history unknown: Yes    Social History Social History  Substance Use Topics  . Smoking status: Never Smoker   . Smokeless tobacco: Never Used  . Alcohol Use: No    Review of Systems  Constitutional: Positive for fever. Eyes: Negative for visual changes. ENT: Negative for sore throat Cardiovascular: Negative for chest pain. Respiratory: Negative for shortness of breath. Gastrointestinal: Negative for abdominal pain, vomiting and diarrhea. Genitourinary: Negative for dysuria. Musculoskeletal: Negative for back pain. Skin: Negative for rash. Neurological: Negative for headaches or focal weakness     ____________________________________________   PHYSICAL EXAM:  VITAL SIGNS: ED Triage Vitals  Enc Vitals Group     BP 09/22/14 0630 148/65 mmHg     Pulse Rate 09/22/14 0630 95     Resp 09/22/14 0630 18     Temp 09/22/14 0630 101.9 F (38.8 C)     Temp Source 09/22/14 0630 Oral     SpO2 09/22/14 0630 100 %     Weight 09/22/14 0630 128 lb (58.06 kg)     Height 09/22/14 0630 5\' 3"  (1.6 m)     Head Cir --      Peak Flow --      Pain Score 09/22/14 0631 0     Pain Loc --      Pain Edu? --      Excl. in GC? --      Constitutional: Alert and oriented. Well appearing and in no distress. Pleasant and interactive Eyes: Conjunctivae are normal.  ENT   Head: Normocephalic and atraumatic.   Mouth/Throat: Mucous membranes are moist. Cardiovascular: Normal rate, regular rhythm. Normal and symmetric distal pulses are present in all extremities. No murmurs, rubs, or gallops. Respiratory: Normal respiratory effort  without tachypnea nor retractions. Breath sounds are clear and equal bilaterally.  Gastrointestinal: Soft and non-tender in all quadrants. No distention. There is no CVA tenderness. Genitourinary:  deferred Musculoskeletal: Nontender with normal range of motion in all extremities. No lower extremity tenderness nor edema. Neurologic:  Normal speech and language. No gross focal neurologic deficits are appreciated. Skin:  Skin is warm, dry and intact. No rash noted. Psychiatric: Mood and affect are normal. Patient exhibits appropriate insight and judgment.  ____________________________________________    LABS (pertinent positives/negatives)  Labs Reviewed  COMPREHENSIVE METABOLIC PANEL - Abnormal; Notable for the following:    Chloride 99 (*)    BUN 23 (*)    Creatinine, Ser 1.55 (*)    GFR calc non Af Amer 29 (*)    GFR calc Af Amer 33 (*)    All other components within normal limits  CULTURE, BLOOD (ROUTINE X 2)  CULTURE, BLOOD (ROUTINE X 2)  URINE CULTURE  LACTIC ACID, PLASMA  LACTIC ACID, PLASMA  CBC WITH DIFFERENTIAL/PLATELET  URINALYSIS COMPLETEWITH MICROSCOPIC ([facility] ONLY)  TROPONIN I  BRAIN NATRIURETIC PEPTIDE  LIPASE, BLOOD  CBC WITH DIFFERENTIAL/PLATELET    ____________________________________________   EKG  ED ECG REPORT I, Jene Every, the attending physician, personally viewed and interpreted this ECG.   Date: 09/22/2014  EKG Time: 6:34 AM  Rate: 95  Rhythm: normal sinus rhythm  Axis: Normal axis  Intervals:first-degree A-V block   ST&T Change: None   ____________________________________________    RADIOLOGY I have personally reviewed any xrays that were ordered on this patient: Chest x-ray unremarkable  ____________________________________________   PROCEDURES  Procedure(s) performed: none  Critical Care performed: none  ____________________________________________   INITIAL IMPRESSION / ASSESSMENT AND PLAN / ED COURSE  Pertinent labs & imaging results that were available during my care of the patient were reviewed by me and considered in my medical decision making (see chart for details).  Patient with with fever noted in  triage and hypoxic and requiring oxygen. She does not use home O2. Her lactic acid is 2.6. Initiated sepsis protocol. Pending X-ray chest x-ray and urine to guide antibiotics. ____________________________________________ Chest x-ray does not show pneumonia however that is still the most likely diagnosis. Urine is normal. I'll admit the patient to the hospital given elevated lactic acid white count temperature and hypoxia  FINAL CLINICAL IMPRESSION(S) / ED DIAGNOSES  Final diagnoses:  Community acquired pneumonia     Jene Every, MD 09/22/14 1015

## 2014-09-22 NOTE — ED Notes (Signed)
Pt to rm 4 via EMS from [facility].  EMS reports unwitnessed fall, fever of 101.7.  Pt denies any pain or sx.  Pt NAD at this time.

## 2014-09-22 NOTE — H&P (Signed)
[facility] - [place] at [facility]   PATIENT NAME: [name]    MR#:  [id number]  DATE OF BIRTH:  Nov 14, 1922  DATE OF ADMISSION:  09/22/2014  PRIMARY CARE PHYSICIAN: Marguarite Arbour, MD   REQUESTING/REFERRING PHYSICIAN: Jene Every, MD  CHIEF COMPLAINT:   Chief Complaint  Patient presents with  . Fall  . Fever   fall and a fever today.  HISTORY OF PRESENT ILLNESS:  [name]  is a 81 y.o. female with a known history of hypertension, CAD, hyperlipidemia and hypothyroidism. The patient was sent from skilled nursing facility to ED due to fall and a fever today. Patient is alert, awake and oriented. She denies any symptoms except fever 102 today. She reported that she fell by accident but he denies any injury, syncope or loss of consciousness. Patient was found to be hypoxic in the ED. In addition, she has leukocytosis and elevated lactic acid. Chest x-ray didn't show any infiltrates but Dr. Cyril Loosen think patient has early pneumonia. She was treated with the Zithromax and Rocephin in ED.  PAST MEDICAL HISTORY:   Past Medical History  Diagnosis Date  . Hyperlipidemia   . Hypertension   . Hypothyroidism   . GERD (gastroesophageal reflux disease)   . Pneumonia   . Lymphedema     PAST SURGICAL HISTORY:   Past Surgical History  Procedure Laterality Date  . Cholecystectomy    . Abdominal hysterectomy    . Coronary artery bypass graft      SOCIAL HISTORY:   Social History  Substance Use Topics  . Smoking status: Never Smoker   . Smokeless tobacco: Never Used  . Alcohol Use: No    FAMILY HISTORY:   Family History  Problem Relation Age of Onset  . Family history unknown: Yes    DRUG ALLERGIES:   Allergies  Allergen Reactions  . Iodine     Rash/hives  . Tetanus Toxoids Other (See Comments)    From nursing home MAR...side effects unknown    REVIEW OF SYSTEMS:  CONSTITUTIONAL: Has fever and weakness. Poor oral intake  recently. EYES: No blurred or double vision.  EARS, NOSE, AND THROAT: No tinnitus or ear pain.  RESPIRATORY: No cough, shortness of breath, wheezing or hemoptysis.  CARDIOVASCULAR: No chest pain, orthopnea, edema.  GASTROINTESTINAL: No nausea, vomiting, diarrhea or abdominal pain.  GENITOURINARY: No dysuria, hematuria.  ENDOCRINE: No polyuria, nocturia,  HEMATOLOGY: No anemia, easy bruising or bleeding SKIN: No rash or lesion. MUSCULOSKELETAL: No joint pain or arthritis.   NEUROLOGIC: No tingling, numbness, no focal weakness.  PSYCHIATRY: No anxiety or depression.   MEDICATIONS AT HOME:   Prior to Admission medications   Medication Sig Start Date End Date Taking? Authorizing Provider  acetaminophen (TYLENOL) 500 MG tablet Take 500 mg by mouth every 4 (four) hours as needed for mild pain or fever.   Yes Historical Provider, MD  ALPRAZolam Prudy Feeler) 1 MG tablet Take 1 mg by mouth at bedtime. For anxiety   Yes Historical Provider, MD  alum & mag hydroxide-simeth (MAALOX/MYLANTA) 200-200-20 MG/5ML suspension Take 30 mLs by mouth every 6 (six) hours as needed for indigestion or heartburn.   Yes Historical Provider, MD  amLODipine (NORVASC) 2.5 MG tablet Take 2.5 mg by mouth every morning.    Yes Historical Provider, MD  aspirin 81 MG EC tablet Take 81 mg by mouth daily.     Yes Historical Provider, MD  docusate sodium (COLACE) 100 MG capsule Take 100 mg by  mouth daily. At 4 pm   Yes Historical Provider, MD  gabapentin (NEURONTIN) 100 MG capsule Take 100 mg by mouth every 8 (eight) hours.    Yes Historical Provider, MD  guaifenesin (ROBITUSSIN) 100 MG/5ML syrup Take 200 mg by mouth every 6 (six) hours as needed for cough.   Yes Historical Provider, MD  HYDROcodone-acetaminophen (NORCO) 10-325 MG per tablet Take 1 tablet by mouth every 4 (four) hours as needed for moderate pain.   Yes Historical Provider, MD  levothyroxine (SYNTHROID, LEVOTHROID) 100 MCG tablet Take 100 mcg by mouth daily.   Yes  Historical Provider, MD  loperamide (IMODIUM) 2 MG capsule Take 2 mg by mouth as needed for diarrhea or loose stools.   Yes Historical Provider, MD  magnesium hydroxide (MILK OF MAGNESIA) 400 MG/5ML suspension Take 30 mLs by mouth daily as needed for mild constipation.   Yes Historical Provider, MD  metoprolol succinate (TOPROL-XL) 50 MG 24 hr tablet Take 50 mg by mouth daily.   Yes Historical Provider, MD  mirtazapine (REMERON) 7.5 MG tablet Take 7.5 mg by mouth at bedtime.   Yes Historical Provider, MD  neomycin-bacitracin-polymyxin (NEOSPORIN) [phone] ointment Apply 1 application topically as needed (skin tears, abrasions, or minor irritations).   Yes Historical Provider, MD  pantoprazole (PROTONIX) 40 MG tablet Take 40 mg by mouth daily.   Yes Historical Provider, MD  senna-docusate (SENOKOT-S) 8.6-50 MG per tablet Take 1 tablet by mouth 2 (two) times daily as needed for mild constipation.   Yes Historical Provider, MD  simvastatin (ZOCOR) 40 MG tablet Take 40 mg by mouth at bedtime.     Yes Historical Provider, MD  sucralfate (CARAFATE) 1 G tablet Take 1 g by mouth daily.    Yes Historical Provider, MD  torsemide (DEMADEX) 10 MG tablet Take 10 mg by mouth daily.   Yes Historical Provider, MD  vitamin B-12 (CYANOCOBALAMIN) 1000 MCG tablet Take 1,000 mcg by mouth every morning.    Yes Historical Provider, MD      VITAL SIGNS:  Blood pressure 99/58, pulse 89, temperature 100 F (37.8 C), temperature source Oral, resp. rate 18, height 5\' 3"  (1.6 m), weight 58.06 kg (128 lb), SpO2 98 %.  PHYSICAL EXAMINATION:  GENERAL:  81 y.o.-year-old patient lying in the bed with no acute distress.  EYES: Pupils equal, round, reactive to light and accommodation. No scleral icterus. Extraocular muscles intact.  HEENT: Head atraumatic, normocephalic. Oropharynx and nasopharynx clear. Dry oral mucosa. NECK:  Supple, no jugular venous distention. No thyroid enlargement, no tenderness.  LUNGS: Normal breath  sounds bilaterally, no wheezing, rales,rhonchi or crepitation. No use of accessory muscles of respiration.  CARDIOVASCULAR: S1, S2 normal. No murmurs, rubs, or gallops.  ABDOMEN: Soft, nontender, nondistended. Bowel sounds present. No organomegaly or mass.  EXTREMITIES: No pedal edema, cyanosis, or clubbing.  NEUROLOGIC: Cranial nerves II through XII are intact. Muscle strength 5/5 in all extremities. Sensation intact. Gait not checked.  PSYCHIATRIC: The patient is alert and oriented x 3.  SKIN: No obvious rash, lesion, or ulcer.   LABORATORY PANEL:   CBC  Recent Labs Lab 09/22/14 0716  WBC 12.6*  HGB 10.8*  HCT 32.1*  PLT 175   ------------------------------------------------------------------------------------------------------------------  Chemistries   Recent Labs Lab 09/22/14 0635  NA 136  K 3.8  CL 99*  CO2 25  GLUCOSE 95  BUN 23*  CREATININE 1.55*  CALCIUM 9.8  AST 35  ALT 16  ALKPHOS 74  BILITOT 0.4   ------------------------------------------------------------------------------------------------------------------  Cardiac Enzymes  Recent Labs Lab 09/22/14 0635  TROPONINI <0.03   ------------------------------------------------------------------------------------------------------------------  RADIOLOGY:  Dg Chest Port 1 View  09/22/2014   CLINICAL DATA:  Unwitnessed fall, fever  EXAM: PORTABLE CHEST - 1 VIEW  COMPARISON:  07/05/2014  FINDINGS: There is no focal parenchymal opacity. There is no pleural effusion or pneumothorax. The heart and mediastinal contours are unremarkable. There is evidence of prior CABG.  The osseous structures are unremarkable.  IMPRESSION: No active disease.   Electronically Signed   By: Elige Ko   On: 09/22/2014 07:40    EKG:   Orders placed or performed during the hospital encounter of 09/22/14  . EKG 12-Lead  . EKG 12-Lead    IMPRESSION AND PLAN:   Sepsis Possible pneumonia Hypoxia Acute renal failure due to  dehydration. Lactic acidosis Hypertension CAD  The plan will be admitted to medical floor. I will start antibiotics with Elita Quick and vancomycin for possible healthcare acquired pneumonia. In addition, follow-up CBC, blood culture and sputum culture. Oxygen by nasal cannular with nebulizer treatment. I will give IV normal saline for acute renal failure and follow-up BMP. Continue patient's hypertension medication and aspirin. Fall precaution.   All the records are reviewed and case discussed with ED provider. Management plans discussed with the patient, the patient's daughter and they are in agreement.  CODE STATUS: DO NOT RESUSCITATE  TOTAL TIME TAKING CARE OF THIS PATIENT:  55  minutes.    Shaune Pollack M.D on 09/22/2014 at 10:39 AM  Between 7am to 6pm - Pager - [phone]  After 6pm go to [email or web address] - password [email or web address] [facility]  [place] [facility]  Office  [phone]  CC: Primary care physician; Marguarite Arbour, MD

## 2014-09-22 NOTE — ED Notes (Signed)
Pt's o2 sat continue in mid to upper 80s.  o2 increased to 4L

## 2014-09-22 NOTE — Progress Notes (Addendum)
ANTIBIOTIC CONSULT NOTE - INITIAL  Pharmacy Consult for Vancomycin and Renal adjustment  Indication: pneumonia  Allergies  Allergen Reactions  . Iodine     Rash/hives  . Tetanus Toxoids Other (See Comments)    From nursing home MAR...side effects unknown    Patient Measurements: Height:  (160 cm) Weight: 128 lb (58.06 kg) IBW/kg (Calculated) : 52.4 Adjusted Body Weight:   Vital Signs: Temp: 100 F (37.8 C) (08/13 0952) Temp Source: Oral (08/13 0952) BP: 101/36 mmHg (08/13 1210) Pulse Rate: 74 (08/13 1210) Intake/Output from previous day:   Intake/Output from this shift:    Labs:  Recent Labs  09/22/14 0635 09/22/14 0716  WBC  --  12.6*  HGB  --  10.8*  PLT  --  175  CREATININE 1.55*  --    Estimated Creatinine Clearance: 20.4 mL/min (by C-G formula based on Cr of 1.55). No results for input(s): VANCOTROUGH, VANCOPEAK, VANCORANDOM, GENTTROUGH, GENTPEAK, GENTRANDOM, TOBRATROUGH, TOBRAPEAK, TOBRARND, AMIKACINPEAK, AMIKACINTROU, AMIKACIN in the last 72 hours.   Microbiology: No results found for this or any previous visit (from the past 720 hour(s)).  Medical History: Past Medical History  Diagnosis Date  . Hyperlipidemia   . Hypertension   . Hypothyroidism   . GERD (gastroesophageal reflux disease)   . Pneumonia   . Lymphedema     Medications:  Scheduled:  . ALPRAZolam  1 mg Oral QHS  . [START ON 09/23/2014] amLODipine  2.5 mg Oral BH-q7a  . aspirin  81 mg Oral Daily  . cefTAZidime (FORTAZ)  IV  2 g Intravenous 3 times per day  . docusate sodium  100 mg Oral Daily  . gabapentin  100 mg Oral 3 times per day  . heparin  5,000 Units Subcutaneous 3 times per day  . [START ON 09/23/2014] levothyroxine  100 mcg Oral QAC breakfast  . metoprolol succinate  50 mg Oral Daily  . mirtazapine  7.5 mg Oral QHS  . pantoprazole  40 mg Oral Daily  . simvastatin  40 mg Oral QHS  . sucralfate  1 g Oral Daily  . torsemide  10 mg Oral Daily  . vancomycin  750 mg  Intravenous STAT  . vancomycin  750 mg Intravenous Q36H  . [START ON 09/23/2014] vitamin B-12  1,000 mcg Oral BH-q7a   Assessment: Patient being treated for sepsis secondary to possible pneumonia.   PK parameters:  Kel (hr-1): 0.021 Half-life (hrs): 33.01 Vd (liters): 40.67 (factor used: 0.7 L/kg)  Goal of Therapy:  Vancomycin trough level 15-20 mcg/ml  Plan:   Will give Vancomycin 750 mg IV x 1 then will start Vancomycin 750 mg IV q36 hours ~ 10 hours post dose. Will order Vancomycin trough level to be drawn prior to the 1100 dose on 8/18.   Will change Fortaz from 2 g IV q8 hours to 1 g IV q24 hours based on renal function.   Measure antibiotic drug levels at steady state Follow up culture results  Frederico Gerling D 09/22/2014,12:47 PM

## 2014-09-22 NOTE — ED Notes (Signed)
Pt's daughter reports pt has chronic pain to right hip/groin and scheduled for MRI this afternoon at 2:30.

## 2014-09-22 NOTE — Plan of Care (Addendum)
Problem: Discharge Progression Outcomes Goal: Dyspnea controlled Individualization: Pt from [facility] assisted [facility], does not use home o2, high fall risk, frequent falls, uses a walker when ambulating  Outcome: Progressing Individualization:  1. Lives at [facility] 2. Had outpatient MRI scheduled for today of right femur due to recent pain; reordered as inpatient. 3. Lives to be called Marilee with dgt very active in her care. 4. Had recent fall at residence.  High risk for fall-offer toileting every hour WA with rounds, bed alarm, "call, don't fall' education done, keep [facility] and personal items close at hand, room near nurses station. 5. Medical history: HTN, CAD, hypothyroid, GERD controlled by home meds. Goal: Other Discharge Outcomes/Goals Outcome: Progressing 1. No dysnea or respiratory issues. 2. Pulse ox 100 % on 3l/[place] with taper to 2l/[place]. 3. Will assess for home 02 use closer to discharge. 4.Pt not able to administer own respiratory meds; from [facility]. 5. Will need pneumonia vaccine-pt and dgt does not recall if pt has had it. 6. Needs assistance with ADL's with 1+ with walker/BSC transfers. 7. Plans to return to [facility] if appropriate. 8. Denies pain this shift. 9. Appetite good; eating well. No issues. 10. Bedrest with BSC assistance since admission. 11. Receiving antibiotics IV currently, awaiting MRI, IVF's for hydration. Held metoprolol this shift due to lower BP's reading.  Metoprolol held due to lower BP readings with MD order received to hold today's dose with restart tomorrow.

## 2014-09-22 NOTE — Progress Notes (Signed)
Sputum cup in room.

## 2014-09-22 NOTE — ED Notes (Addendum)
Pt o2sat 80 on RA.  2L o2 via [place] applied.

## 2014-09-23 ENCOUNTER — Inpatient Hospital Stay: Payer: Commercial Managed Care - HMO

## 2014-09-23 DIAGNOSIS — I10 Essential (primary) hypertension: Secondary | ICD-10-CM | POA: Diagnosis not present

## 2014-09-23 DIAGNOSIS — E039 Hypothyroidism, unspecified: Secondary | ICD-10-CM | POA: Diagnosis not present

## 2014-09-23 DIAGNOSIS — Z66 Do not resuscitate: Secondary | ICD-10-CM | POA: Diagnosis not present

## 2014-09-23 DIAGNOSIS — I2510 Atherosclerotic heart disease of native coronary artery without angina pectoris: Secondary | ICD-10-CM | POA: Diagnosis not present

## 2014-09-23 DIAGNOSIS — E785 Hyperlipidemia, unspecified: Secondary | ICD-10-CM | POA: Diagnosis not present

## 2014-09-23 DIAGNOSIS — M25551 Pain in right hip: Secondary | ICD-10-CM | POA: Diagnosis not present

## 2014-09-23 DIAGNOSIS — E872 Acidosis: Secondary | ICD-10-CM | POA: Diagnosis not present

## 2014-09-23 DIAGNOSIS — B349 Viral infection, unspecified: Secondary | ICD-10-CM | POA: Diagnosis not present

## 2014-09-23 DIAGNOSIS — N179 Acute kidney failure, unspecified: Secondary | ICD-10-CM | POA: Diagnosis not present

## 2014-09-23 LAB — BASIC METABOLIC PANEL
Anion gap: 6 (ref 5–15)
BUN: 19 mg/dL (ref 6–20)
CHLORIDE: 108 mmol/L (ref 101–111)
CO2: 27 mmol/L (ref 22–32)
Calcium: 9.1 mg/dL (ref 8.9–10.3)
Creatinine, Ser: 1.16 mg/dL — ABNORMAL HIGH (ref 0.44–1.00)
GFR calc Af Amer: 47 mL/min — ABNORMAL LOW (ref >60)
GFR calc non Af Amer: 40 mL/min — ABNORMAL LOW (ref >60)
Glucose, Bld: 100 mg/dL — ABNORMAL HIGH (ref 65–99)
POTASSIUM: 3.2 mmol/L — AB (ref 3.5–5.1)
SODIUM: 141 mmol/L (ref 135–145)

## 2014-09-23 LAB — CBC
HEMATOCRIT: 27.2 % — AB (ref 35.0–47.0)
HEMOGLOBIN: 9.1 g/dL — AB (ref 12.0–16.0)
MCH: 30.3 pg (ref 26.0–34.0)
MCHC: 33.5 g/dL (ref 32.0–36.0)
MCV: 90.6 fL (ref 80.0–100.0)
PLATELETS: 136 10*3/uL — AB (ref 150–440)
RBC: 3 MIL/uL — ABNORMAL LOW (ref 3.80–5.20)
RDW: 13.1 % (ref 11.5–14.5)
WBC: 6.8 10*3/uL (ref 3.6–11.0)

## 2014-09-23 LAB — URINE CULTURE: Culture: NO GROWTH

## 2014-09-23 LAB — MRSA PCR SCREENING: MRSA BY PCR: NEGATIVE

## 2014-09-23 MED ORDER — VANCOMYCIN HCL IN DEXTROSE 1-5 GM/200ML-% IV SOLN
1000.0000 mg | INTRAVENOUS | Status: DC
  Filled 2014-09-23: qty 200

## 2014-09-23 MED ORDER — SODIUM CHLORIDE 0.9 % IJ SOLN: 3.0000 mL | INTRAMUSCULAR | Status: DC | PRN

## 2014-09-23 MED ORDER — POTASSIUM CHLORIDE 20 MEQ PO PACK
20.0000 meq | PACK | Freq: Two times a day (BID) | ORAL | Status: DC
  Administered 2014-09-23 – 2014-09-24 (×3): 20 meq via ORAL
  Filled 2014-09-23 (×3): qty 1

## 2014-09-23 NOTE — Progress Notes (Signed)
[name] is a 81 y.o. female  Sepsis   SUBJECTIVE:  Pt feels weak but in NAD. Afebrile. WBC normal. Not tachycardic or hypotensive. Cultures negative to date. CXR and UA negative. MRI of femur with no significant pathology.  ______________________________________________________________________  ROS: Review of systems is unremarkable for any active cardiac,respiratory, GI, GU, hematologic, neurologic or psychiatric systems, 10 systems reviewed.  @  Past Medical History  Diagnosis Date  . Hyperlipidemia   . Hypertension   . Hypothyroidism   . GERD (gastroesophageal reflux disease)   . Pneumonia   . Lymphedema     Past Surgical History  Procedure Laterality Date  . Cholecystectomy    . Abdominal hysterectomy    . Coronary artery bypass graft      PHYSICAL EXAM:  BP 125/48 mmHg  Pulse 84  Temp(Src) 98.4 F (36.9 C) (Oral)  Resp 18  Ht  (1.6 m)  Wt 58.06 kg (128 lb)  BMI 22.68 kg/m2  SpO2 98%  Wt Readings from Last 3 Encounters:  09/22/14 58.06 kg (128 lb)  07/05/14 57.153 kg (126 lb)  04/24/14 58.287 kg (128 lb 8 oz)            Constitutional: NAD Neck: supple, no thyromegaly Respiratory: CTA, no rales or wheezes Cardiovascular: RRR, no murmur, no gallop Abdomen: soft, good BS, nontender Extremities: no edema Neuro: alert and oriented, no focal motor or sensory deficits  ASSESSMENT/PLAN:  Labs and imaging studies were reviewed  Will continue IV ABX. Repeat CXR today. PT and CSW consults today. Plan D/C tomorrow if CXR neg and cultures unremarkable.

## 2014-09-23 NOTE — Plan of Care (Signed)
Problem: Discharge Progression Outcomes Goal: Dyspnea controlled Individualization: Ipt prefers to be called [name] From [facility] Hx of hyperlipidemia, HTN, hypothyroidism, GERD, pneumonia, lymphedema HIGH Fall precautions    Goal: Other Discharge Outcomes/Goals Plan of Care Progress to Goal:  Pt without c/o pain, pt afebrile, daughter at bedside, no distress noted

## 2014-09-23 NOTE — Plan of Care (Signed)
Problem: Discharge Progression Outcomes Goal: Other Discharge Outcomes/Goals Outcome: Progressing 1. Mild DOE without distress.Weaned off 02 with pulse ox normal. No cough- unable to collect sputum specimen.Cup at bedside. 2. Last pulse ox 100% RA.  3. Home 02 not indicated at this time. 4. Pneumovax given this a.m. 5. Improved strength/mobility with pt up to BR with SB+ with minimal assistance to get OOB-tolerates well. 6. Tolerating diet well. 7. Pt requested to walk to BR instead of using BSC. 7. K+ 3.2 with K+ po supplement given. IVF's discontinued.  8. CXR normal. No pain. Afebrile. VSS. IV antibiotic continued. 9. Continue to assess for discharge needs.

## 2014-09-23 NOTE — Progress Notes (Signed)
PT Cancellation Note  Patient Details Name: Lareta Bruneau MRN: [id number] DOB: [birth year]   Cancelled Treatment:    Reason Eval/Treat Not Completed: Patient declined, no reason specified. Patient stated she is just not up for any mobility today. PT asked patient to ambulate in hallway initially, and then to the chair. Patient refused both. PT will continue to follow patient and attempt mobility evaluation as appropriate.  Kerin Ransom, PT, DPT    09/23/2014, 12:34 PM

## 2014-09-23 NOTE — Progress Notes (Addendum)
ANTIBIOTIC CONSULT NOTE - FOLLOW UP   Pharmacy Consult for Vancomycin and Renal adjustment  Indication: pneumonia  Allergies  Allergen Reactions  . Iodine     Rash/hives  . Tetanus Toxoids Other (See Comments)    From nursing home MAR...side effects unknown    Patient Measurements: Height: 5\' 3"  (160 cm) Weight: 128 lb (58.06 kg) IBW/kg (Calculated) : 52.4 Adjusted Body Weight:   Vital Signs: Temp: 98.4 F (36.9 C) (08/14 0456) Temp Source: Oral (08/14 0456) BP: 125/48 mmHg (08/14 0456) Pulse Rate: 84 (08/14 0456) Intake/Output from previous day: 08/13 0701 - 08/14 0700 In: 1470 [P.O.:480; I.V.:809; IV Piggyback:181] Out: 1976 [Urine:1975; Stool:1] Intake/Output from this shift: Total I/O In: -  Out: 501 [Urine:500; Stool:1]  Labs:  Recent Labs  09/22/14 0635 09/22/14 0716 09/23/14 0419  WBC  --  12.6* 6.8  HGB  --  10.8* 9.1*  PLT  --  175 136*  CREATININE 1.55*  --  1.16*   Estimated Creatinine Clearance: 27.2 mL/min (by C-G formula based on Cr of 1.16). No results for input(s): VANCOTROUGH, VANCOPEAK, VANCORANDOM, GENTTROUGH, GENTPEAK, GENTRANDOM, TOBRATROUGH, TOBRAPEAK, TOBRARND, AMIKACINPEAK, AMIKACINTROU, AMIKACIN in the last 72 hours.   Microbiology: Recent Results (from the past 720 hour(s))  Culture, blood (routine x 2)     Status: None (Preliminary result)   Collection Time: 09/22/14  6:35 AM  Result Value Ref Range Status   Specimen Description BLOOD LEFT WRIST  Final   Special Requests BOTTLES DRAWN AEROBIC AND ANAEROBIC  2CC  Final   Culture NO GROWTH 1 DAY  Final   Report Status PENDING  Incomplete  Culture, blood (routine x 2)     Status: None (Preliminary result)   Collection Time: 09/22/14  6:37 AM  Result Value Ref Range Status   Specimen Description BLOOD LEFT WRIST  Final   Special Requests BOTTLES DRAWN AEROBIC AND ANAEROBIC  4CC  Final   Culture NO GROWTH 1 DAY  Final   Report Status PENDING  Incomplete  MRSA PCR Screening      Status: None   Collection Time: 09/23/14  3:10 AM  Result Value Ref Range Status   MRSA by PCR NEGATIVE NEGATIVE Final    Comment:        The GeneXpert MRSA Assay (FDA approved for NASAL specimens only), is one component of a comprehensive MRSA colonization surveillance program. It is not intended to diagnose MRSA infection nor to guide or monitor treatment for MRSA infections.     Medical History: Past Medical History  Diagnosis Date  . Hyperlipidemia   . Hypertension   . Hypothyroidism   . GERD (gastroesophageal reflux disease)   . Pneumonia   . Lymphedema     Medications:  Scheduled:  . ALPRAZolam  1 mg Oral QHS  . amLODipine  2.5 mg Oral BH-q7a  . aspirin EC  81 mg Oral Daily  . cefTAZidime (FORTAZ)  IV  1 g Intravenous Q24H  . docusate sodium  100 mg Oral Daily  . gabapentin  100 mg Oral 3 times per day  . heparin  5,000 Units Subcutaneous 3 times per day  . levothyroxine  100 mcg Oral QAC breakfast  . metoprolol succinate  50 mg Oral Daily  . mirtazapine  7.5 mg Oral QHS  . pantoprazole  40 mg Oral Daily  . potassium chloride  20 mEq Oral BID  . simvastatin  40 mg Oral QHS  . sodium chloride  3 mL Intravenous Q12H  .  sucralfate  1 g Oral Daily  . torsemide  10 mg Oral Daily  . vancomycin  750 mg Intravenous Q36H  . vitamin B-12  1,000 mcg Oral BH-q7a   Assessment: Patient being treated for sepsis secondary to possible pneumonia.   PK parameters:  Kel (hr-1): 0.021 Half-life (hrs): 33.01 Vd (liters): 40.67 (factor used: 0.7 L/kg)  Goal of Therapy:  Vancomycin trough level 15-20 mcg/ml  Plan:   Will give Vancomycin 750 mg IV x 1 then will start Vancomycin 750 mg IV q36 hours ~ 10 hours post dose. Will order Vancomycin trough level to be drawn prior to the 1100 dose on 8/18.   8/14: Patient's renal function has improved overnight. Scr now = 1.16 down from 1.5.  PK parameters:  Kel (hr-1): 0.024 Half-life (hrs): 28.88 Vd (liters): 40.67 (factor  used: 0.7 L/kg)  Will change to Vancomycin 1 g IV q36 hours.  Trough level ordered to be drawn @ 09:30 on 8/18. Level will not be @ CSS due to dose change.   Will change Fortaz from 2 g IV q8 hours to 1 g IV q24 hours based on renal function.   Measure antibiotic drug levels at steady state Follow up culture results  Tatem Fesler D 09/23/2014,9:29 AM

## 2014-09-24 LAB — LEGIONELLA PNEUMOPHILA SEROGP 1 UR AG: L. pneumophila Serogp 1 Ur Ag: NEGATIVE

## 2014-09-24 LAB — CBC WITH DIFFERENTIAL/PLATELET
BASOS ABS: 0 10*3/uL (ref 0–0.1)
BASOS PCT: 0 %
EOS ABS: 0.6 10*3/uL (ref 0–0.7)
Eosinophils Relative: 8 %
HEMATOCRIT: 29.9 % — AB (ref 35.0–47.0)
Hemoglobin: 10.1 g/dL — ABNORMAL LOW (ref 12.0–16.0)
Lymphocytes Relative: 20 %
Lymphs Abs: 1.4 10*3/uL (ref 1.0–3.6)
MCH: 30.5 pg (ref 26.0–34.0)
MCHC: 33.8 g/dL (ref 32.0–36.0)
MCV: 90.2 fL (ref 80.0–100.0)
MONO ABS: 0.6 10*3/uL (ref 0.2–0.9)
Monocytes Relative: 8 %
NEUTROS ABS: 4.4 10*3/uL (ref 1.4–6.5)
Neutrophils Relative %: 64 %
PLATELETS: 168 10*3/uL (ref 150–440)
RBC: 3.31 MIL/uL — ABNORMAL LOW (ref 3.80–5.20)
RDW: 13.4 % (ref 11.5–14.5)
WBC: 7 10*3/uL (ref 3.6–11.0)

## 2014-09-24 LAB — COMPREHENSIVE METABOLIC PANEL
ALBUMIN: 3.2 g/dL — AB (ref 3.5–5.0)
ALT: 14 U/L (ref 14–54)
AST: 24 U/L (ref 15–41)
Alkaline Phosphatase: 61 U/L (ref 38–126)
Anion gap: 8 (ref 5–15)
BUN: 14 mg/dL (ref 6–20)
CHLORIDE: 108 mmol/L (ref 101–111)
CO2: 25 mmol/L (ref 22–32)
Calcium: 9.3 mg/dL (ref 8.9–10.3)
Creatinine, Ser: 1.01 mg/dL — ABNORMAL HIGH (ref 0.44–1.00)
GFR calc Af Amer: 55 mL/min — ABNORMAL LOW (ref >60)
GFR calc non Af Amer: 48 mL/min — ABNORMAL LOW (ref >60)
GLUCOSE: 93 mg/dL (ref 65–99)
POTASSIUM: 3.5 mmol/L (ref 3.5–5.1)
SODIUM: 141 mmol/L (ref 135–145)
Total Bilirubin: 0.4 mg/dL (ref 0.3–1.2)
Total Protein: 6 g/dL — ABNORMAL LOW (ref 6.5–8.1)

## 2014-09-24 LAB — STREPTOCOCCUS PNEUMONIAE AG (CSF): STREPTOCOCCUS PNEUMONIAE AG: NEGATIVE

## 2014-09-24 MED ORDER — CEFUROXIME AXETIL 250 MG PO TABS
250.0000 mg | ORAL_TABLET | Freq: Two times a day (BID) | ORAL | Status: DC
  Administered 2014-09-24: 250 mg via ORAL
  Filled 2014-09-24: qty 1

## 2014-09-24 MED ORDER — CEFUROXIME AXETIL 250 MG PO TABS: 250.0000 mg | ORAL_TABLET | Freq: Two times a day (BID) | ORAL | Status: DC

## 2014-09-24 MED ORDER — POTASSIUM CHLORIDE 20 MEQ PO PACK: 20.0000 meq | PACK | Freq: Two times a day (BID) | ORAL | Status: DC

## 2014-09-24 NOTE — Discharge Instructions (Signed)
Needs home health

## 2014-09-24 NOTE — Plan of Care (Signed)
Problem: Discharge Progression Outcomes Goal: Other Discharge Outcomes/Goals Outcome: Progressing Plan of care progress to goal: Patient alert and oriented, No s/s of distress Patient up to bathroom with standby assist Vital signs stable, afebrile, no cough or sob noted. Remains of iv abt's Patient to return back to [facility] on discharge

## 2014-09-24 NOTE — Evaluation (Signed)
Physical Therapy Evaluation Patient Details Name: Hanalei Glace MRN: [id number] DOB: 07-Aug-1922 Today's Date: 09/24/2014   History of Present Illness  Patient is an 81 y/o female that presents after falling at her ALF, and was admitted with a fever. No injuries sustained during fall.   Clinical Impression  Patient is an 81 y/o female that presents after a fall at [facility] where she had been in an ALF unit. Patient typically ambulates as far as she would like with a RW, and states she fell accidentally with no injuries last week. Patient displays low falls risk on multiple outcome measures and ambulates 360' with no loss of balance or fatigue during this session. At this time patient appears at or very near her baseline and has no skillable rehab needs at this time. Per patient's daughter, she is unsatisfied with the care provided at [facility] and was seeking a rehab placement at [facility] for better long term care. PT recommended this discussion be continued with Case Manager for long term living arrangement venue change.     Follow Up Recommendations No PT follow up    Equipment Recommendations       Recommendations for Other Services       Precautions / Restrictions Precautions Precautions: Fall Restrictions Weight Bearing Restrictions: No      Mobility  Bed Mobility Overal bed mobility: Modified Independent             General bed mobility comments: Patient uses bed rails to complete transfer with no physical assistance.   Transfers Overall transfer level: Needs assistance Equipment used: Rolling walker (2 wheeled) Transfers: Sit to/from Stand Sit to Stand: Supervision         General transfer comment: Patient initially with mild balance deficit upon standing, completes 5x sit to stand in 7 seconds later.   Ambulation/Gait Ambulation/Gait assistance: Supervision Ambulation Distance (Feet): 360 Feet Assistive device: Rolling walker (2  wheeled) Gait Pattern/deviations: WFL(Within Functional Limits)   Gait velocity interpretation: at or above normal speed for age/gender General Gait Details: No gait or balance deficits noted for her age/gender.   Stairs            Wheelchair Mobility    Modified Rankin (Stroke Patients Only)       Balance Overall balance assessment: Needs assistance   Sitting balance-Leahy Scale: Normal     Standing balance support: Bilateral upper extremity supported Standing balance-Leahy Scale: Good Standing balance comment: Tinetti - 26/28, Modified DGI with RW 11/12, 5x sit to stand - 7 seconds. Demonstrating low falls risk.                              Pertinent Vitals/Pain Pain Assessment: No/denies pain (Patient states her right shoulder aches on occassion. )    Home Living Family/patient expects to be discharged to:: Assisted living               Home Equipment: Walker - 2 wheels      Prior Function Level of Independence: Independent with assistive device(s)         Comments: Patient ambulates up and down the hallways at her leisure.      Hand Dominance        Extremity/Trunk Assessment   Upper Extremity Assessment: Overall WFL for tasks assessed           Lower Extremity Assessment: Overall WFL for tasks assessed  Communication   Communication: No difficulties  Cognition Arousal/Alertness: Awake/alert Behavior During Therapy: WFL for tasks assessed/performed Overall Cognitive Status: Within Functional Limits for tasks assessed                      General Comments      Exercises        Assessment/Plan    PT Assessment Patient needs continued PT services  PT Diagnosis Difficulty walking   PT Problem List Decreased strength;Decreased balance  PT Treatment Interventions Gait training;Therapeutic activities;Therapeutic exercise;Balance training   PT Goals (Current goals can be found in the Care Plan  section) Acute Rehab PT Goals Patient Stated Goal: To return to her previous living establishment. Her daughter is unhappy with [facility] and would like a change of venue.  PT Goal Formulation: With patient/family Time For Goal Achievement: 10/08/14 Potential to Achieve Goals: Good    Frequency Min 2X/week (Maintain on caseload so that patient continues to ambulate if she stays in hospital. )   Barriers to discharge        Co-evaluation               End of Session Equipment Utilized During Treatment: Gait belt Activity Tolerance: Patient tolerated treatment well Patient left: in bed;with family/visitor present (Patient declined bed alarm as it kept going off with any movement. ) Nurse Communication: Mobility status         Time: 1610-9604 PT Time Calculation (min) (ACUTE ONLY): 17 min   Charges:   PT Evaluation $Initial PT Evaluation Tier I: 1 Procedure     PT G Codes:       Kerin Ransom, PT, DPT    09/24/2014, 9:51 AM

## 2014-09-24 NOTE — Care Management Important Message (Signed)
Important Message  Patient Details  Name: [name] MRN: [id number] Date of Birth: 02/01/1923   Medicare Important Message Given:  Yes-third notification given    Gwenette Greet, RN 09/24/2014, 8:30 AM

## 2014-09-24 NOTE — Care Management (Signed)
Spoke with Ms. [name] at the bedside. States she has been to [facility] for the last 4 months. Sees Dr. [name] as his primary care physician. No skilled facilities or home health in the home. No home oxygen. Daughter is [name], [phone].  Discharge today per Dr. [name]. Gwenette Greet RN MSN Care Management [phone]

## 2014-09-24 NOTE — Progress Notes (Signed)
Patient discharged back to [facility] - transportation provided by daughter. Discharge packet given to daughter at discharge. All questions answered. Bo Mcclintock, RN

## 2014-09-24 NOTE — Discharge Summary (Signed)
[name], is a 81 y.o. female  DOB [birth year]  MRN [id number].  Admission date:  09/22/2014  Admitting Physician  Marguarite Arbour, MD  Discharge Date:  09/24/2014   Primary MD  Camry Robello D, MD  Recommendations for primary care physician for things to follow:   none   Admission Diagnosis  Community acquired pneumonia [J18.9]   Discharge Diagnosis   Fever, weakness, viral syndrome  Principal Problem:   Sepsis Active Problems:   Hypoxia   Healthcare-associated pneumonia   ARF (acute renal failure)   Lactic acid acidosis      Past Medical History  Diagnosis Date  . Hyperlipidemia   . Hypertension   . Hypothyroidism   . GERD (gastroesophageal reflux disease)   . Pneumonia   . Lymphedema     Past Surgical History  Procedure Laterality Date  . Cholecystectomy    . Abdominal hysterectomy    . Coronary artery bypass graft         History of present illness and  Hospital Course:     Kindly see H&P for history of present illness and admission details, please review complete Labs, Consult reports and Test reports for all details in brief  HPI  from the history and physical done on the day of admission    Hospital Course    Pt admitted with fever and weakness. Sepsis ruled out. No evidencwe of pneumonia or UTI. Fever gone now. WBC normal.   Discharge Condition: stable   Follow UP  Follow-up Information    Follow up with Rehana Uncapher D, MD In 1 week.   Specialty:  Internal Medicine   Contact information:   [address] [place] [address] [phone]         Discharge Instructions  and  Discharge Medications   See below     Medication List    TAKE these medications        acetaminophen 500 MG tablet  Commonly known as:  TYLENOL  Take 500 mg by mouth every 4 (four) hours as needed for mild pain or fever.     ALPRAZolam 1 MG tablet   Commonly known as:  XANAX  Take 1 mg by mouth at bedtime. For anxiety     alum & mag hydroxide-simeth 200-200-20 MG/5ML suspension  Commonly known as:  MAALOX/MYLANTA  Take 30 mLs by mouth every 6 (six) hours as needed for indigestion or heartburn.     amLODipine 2.5 MG tablet  Commonly known as:  NORVASC  Take 2.5 mg by mouth every morning.     aspirin 81 MG EC tablet  Take 81 mg by mouth daily.     cefUROXime 250 MG tablet  Commonly known as:  CEFTIN  Take 1 tablet (250 mg total) by mouth 2 (two) times daily with a meal.     docusate sodium 100 MG capsule  Commonly known as:  COLACE  Take 100 mg by mouth daily. At 4 pm     gabapentin 100 MG capsule  Commonly known as:  NEURONTIN  Take 100 mg by mouth every 8 (eight) hours.     guaifenesin 100 MG/5ML syrup  Commonly known as:  ROBITUSSIN  Take 200 mg by mouth every 6 (six) hours as needed for cough.     HYDROcodone-acetaminophen 10-325 MG per tablet  Commonly known as:  NORCO  Take 1 tablet by mouth every 4 (four) hours as needed for moderate pain.     levothyroxine 100 MCG tablet  Commonly known as:  SYNTHROID, LEVOTHROID  Take 100 mcg by mouth daily.     loperamide 2 MG capsule  Commonly known as:  IMODIUM  Take 2 mg by mouth as needed for diarrhea or loose stools.     magnesium hydroxide 400 MG/5ML suspension  Commonly known as:  MILK OF MAGNESIA  Take 30 mLs by mouth daily as needed for mild constipation.     metoprolol succinate 50 MG 24 hr tablet  Commonly known as:  TOPROL-XL  Take 50 mg by mouth daily.     mirtazapine 7.5 MG tablet  Commonly known as:  REMERON  Take 7.5 mg by mouth at bedtime.     neomycin-bacitracin-polymyxin [phone] ointment  Apply 1 application topically as needed (skin tears, abrasions, or minor irritations).     pantoprazole 40 MG tablet  Commonly known as:  PROTONIX  Take 40 mg by mouth daily.     potassium chloride 20 MEQ packet  Commonly known as:  KLOR-CON  Take 20  mEq by mouth 2 (two) times daily.     senna-docusate 8.6-50 MG per tablet  Commonly known as:  Senokot-S  Take 1 tablet by mouth 2 (two) times daily as needed for mild constipation.     simvastatin 40 MG tablet  Commonly known as:  ZOCOR  Take 40 mg by mouth at bedtime.     sucralfate 1 G tablet  Commonly known as:  CARAFATE  Take 1 g by mouth daily.     torsemide 10 MG tablet  Commonly known as:  DEMADEX  Take 10 mg by mouth daily.     vitamin B-12 1000 MCG tablet  Commonly known as:  CYANOCOBALAMIN  Take 1,000 mcg by mouth every morning.          Diet and Activity recommendation: See Discharge Instructions above   Consults obtained - CSW   Major procedures and Radiology Reports - PLEASE review detailed and final reports for all details, in brief -   See below   Dg Chest 2 View  09/23/2014   CLINICAL DATA:  Fever, leg weakness  EXAM: CHEST  2 VIEW  COMPARISON:  09/22/2014  FINDINGS: Cardiomediastinal silhouette is stable. Status post CABG. Tiny granuloma in left base is stable. No acute infiltrate or pleural effusion. No pulmonary edema. Osteopenia and mild degenerative changes thoracic spine.  IMPRESSION: No active cardiopulmonary disease.   Electronically Signed   By: Natasha Mead M.D.   On: 09/23/2014 11:42   Mr Femur Right Wo Contrast  09/22/2014   CLINICAL DATA:  Medial right thigh pain.  Right hip pain.  EXAM: MRI OF THE RIGHT FEMUR WITHOUT CONTRAST  TECHNIQUE: Multiplanar, multisequence MR imaging of the right femur was performed. No intravenous contrast was administered.  COMPARISON:  MRI dated 02/22/2009  FINDINGS: There are small bilateral hip effusions as well as moderate bilateral knee effusions. No appreciable arthritis of the hip joint. The visualized portion of the femur is normal.  There are partial tears of the origins  of the hamstring tendons from the ischial tuberosities bilaterally. There is slight bilateral greater trochanteric bursitis, slightly more  prominent on the left than the right. There is also distal gluteus medius tendinopathy and tendinitis on the left.  The patient has atrophy of the distal right adductor magnus muscle of unknown etiology. This is chronic.  There is no mass lesion.  No adenopathy.  The patient has extensive diverticulosis of the sigmoid colon.  IMPRESSION: Small nonspecific bilateral hip effusions and moderate bilateral knee effusions. No significant osseous abnormality of the visualized portion of the right femur. No appreciable arthritic changes of the right hip. No mass lesions or soft tissue edema. Chronic atrophy of the distal right adductor magnus muscle of unknown etiology. I doubt that this is symptomatic.  Partial tears of the origins of the hamstring tendons bilaterally, new since 2011.  New bilateral mild greater trochanteric bursitis.   Electronically Signed   By: Francene Boyers M.D.   On: 09/22/2014 17:08   US Venous Img Lower Unilateral Right  08/30/2014   CLINICAL DATA:  81 year old female with right thigh pain for 3 days. Initial encounter.  EXAM: RIGHT LOWER EXTREMITY VENOUS DOPPLER ULTRASOUND  TECHNIQUE: Gray-scale sonography with graded compression, as well as color Doppler and duplex ultrasound were performed to evaluate the lower extremity deep venous systems from the level of the common femoral vein and including the common femoral, femoral, profunda femoral, popliteal and calf veins including the posterior tibial, peroneal and gastrocnemius veins when visible. The superficial great saphenous vein was also interrogated. Spectral Doppler was utilized to evaluate flow at rest and with distal augmentation maneuvers in the common femoral, femoral and popliteal veins.  COMPARISON:  Bilateral lower extremity ultrasound 10/16/2012  FINDINGS: Contralateral Common Femoral Vein: Respiratory phasicity is normal and symmetric with the symptomatic side. No evidence of thrombus. Normal compressibility.  Common Femoral Vein:  No evidence of thrombus. Normal compressibility, respiratory phasicity and response to augmentation.  Saphenofemoral Junction: No evidence of thrombus. Normal compressibility and flow on color Doppler imaging.  Profunda Femoral Vein: No evidence of thrombus. Normal compressibility and flow on color Doppler imaging.  Femoral Vein: No evidence of thrombus. Normal compressibility, respiratory phasicity and response to augmentation.  Popliteal Vein: No evidence of thrombus. Normal compressibility, respiratory phasicity and response to augmentation.  Calf Veins: No evidence of thrombus. Normal compressibility and flow on color Doppler imaging.  Superficial Great Saphenous Vein: No evidence of thrombus. Normal compressibility and flow on color Doppler imaging.  Venous Reflux:  None.  Other Findings:  None.  IMPRESSION: No evidence of right lower extremity deep venous thrombosis.  Preliminary report without discrepancy to the above called by the sonographer to RN French Ana at the office of Dr. Aram Beecham on 08/30/2014 at 1640 hours.   Electronically Signed   By: Odessa Fleming M.D.   On: 08/30/2014 16:52   Dg Chest Port 1 View  09/22/2014   CLINICAL DATA:  Unwitnessed fall, fever  EXAM: PORTABLE CHEST - 1 VIEW  COMPARISON:  07/05/2014  FINDINGS: There is no focal parenchymal opacity. There is no pleural effusion or pneumothorax. The heart and mediastinal contours are unremarkable. There is evidence of prior CABG.  The osseous structures are unremarkable.  IMPRESSION: No active disease.   Electronically Signed   By: Elige Ko   On: 09/22/2014 07:40    Micro Results   See below  Recent Results (from the past 240 hour(s))  Culture, blood (routine x 2)  Status: None (Preliminary result)   Collection Time: 09/22/14  6:35 AM  Result Value Ref Range Status   Specimen Description BLOOD LEFT WRIST  Final   Special Requests BOTTLES DRAWN AEROBIC AND ANAEROBIC  2CC  Final   Culture NO GROWTH 1 DAY  Final   Report  Status PENDING  Incomplete  Culture, blood (routine x 2)     Status: None (Preliminary result)   Collection Time: 09/22/14  6:37 AM  Result Value Ref Range Status   Specimen Description BLOOD LEFT WRIST  Final   Special Requests BOTTLES DRAWN AEROBIC AND ANAEROBIC  4CC  Final   Culture NO GROWTH 1 DAY  Final   Report Status PENDING  Incomplete  Urine culture     Status: None   Collection Time: 09/22/14  9:26 AM  Result Value Ref Range Status   Specimen Description URINE, RANDOM  Final   Special Requests NONE  Final   Culture NO GROWTH 1 DAY  Final   Report Status 09/23/2014 FINAL  Final  MRSA PCR Screening     Status: None   Collection Time: 09/23/14  3:10 AM  Result Value Ref Range Status   MRSA by PCR NEGATIVE NEGATIVE Final    Comment:        The GeneXpert MRSA Assay (FDA approved for NASAL specimens only), is one component of a comprehensive MRSA colonization surveillance program. It is not intended to diagnose MRSA infection nor to guide or monitor treatment for MRSA infections.        Today   Subjective:   [name] today has no headache,no chest abdominal pain,no new weakness tingling or numbness, feels much better wants to go home today.   Objective:   Blood pressure 137/55, pulse 81, temperature 98.9 F (37.2 C), temperature source Oral, resp. rate 18, height 5\' 3"  (1.6 m), weight 58.06 kg (128 lb), SpO2 97 %.   Intake/Output Summary (Last 24 hours) at 09/24/14 0734 Last data filed at 09/23/14 2217  Gross per 24 hour  Intake    785 ml  Output   2851 ml  Net  -2066 ml    Exam Awake Alert, Oriented x 3, No new F.N deficits, Normal affect [place].AT,PERRAL Supple Neck,No JVD, No cervical lymphadenopathy appriciated.  Symmetrical Chest wall movement, Good air movement bilaterally, CTAB RRR,No Gallops,Rubs or new Murmurs, No Parasternal Heave +ve B.Sounds, Abd Soft, Non tender, No organomegaly appriciated, No rebound -guarding or rigidity. No  Cyanosis, Clubbing or edema, No new Rash or bruise  Data Review   CBC w Diff: Lab Results  Component Value Date   WBC 7.0 09/24/2014   WBC 7.4 03/15/2014   HGB 10.1* 09/24/2014   HGB 11.7* 03/15/2014   HCT 29.9* 09/24/2014   HCT 35.6 03/15/2014   PLT 168 09/24/2014   PLT 302 03/15/2014   LYMPHOPCT 20 09/24/2014   LYMPHOPCT 19.6 03/15/2014   MONOPCT 8 09/24/2014   MONOPCT 12.3 03/15/2014   EOSPCT 8 09/24/2014   EOSPCT 5.6 03/15/2014   BASOPCT 0 09/24/2014   BASOPCT 0.7 03/15/2014    CMP: Lab Results  Component Value Date   NA 141 09/24/2014   NA 140 04/24/2014   NA 140 03/15/2014   K 3.5 09/24/2014   K 4.1 03/15/2014   CL 108 09/24/2014   CL 103 03/15/2014   CO2 25 09/24/2014   CO2 30 03/15/2014   BUN 14 09/24/2014   BUN 18 04/24/2014   BUN 33* 03/15/2014   CREATININE 1.01*  09/24/2014   CREATININE 1.45* 03/15/2014   PROT 6.0* 09/24/2014   PROT 6.3* 03/15/2014   ALBUMIN 3.2* 09/24/2014   ALBUMIN 2.7* 03/15/2014   BILITOT 0.4 09/24/2014   BILITOT 0.4 03/15/2014   ALKPHOS 61 09/24/2014   ALKPHOS 55 03/15/2014   AST 24 09/24/2014   AST 25 03/15/2014   ALT 14 09/24/2014   ALT 17 03/15/2014  .   Total Time in preparing paper work, data evaluation and todays exam - 45 minutes  Marnette Perkins D M.D on 09/24/2014 at 7:34 AM

## 2014-09-25 ENCOUNTER — Ambulatory Visit: Payer: Commercial Managed Care - HMO

## 2014-09-26 DIAGNOSIS — Z9181 History of falling: Secondary | ICD-10-CM | POA: Diagnosis not present

## 2014-09-26 DIAGNOSIS — Z8701 Personal history of pneumonia (recurrent): Secondary | ICD-10-CM | POA: Diagnosis not present

## 2014-09-26 DIAGNOSIS — N390 Urinary tract infection, site not specified: Secondary | ICD-10-CM | POA: Diagnosis not present

## 2014-09-26 DIAGNOSIS — I2510 Atherosclerotic heart disease of native coronary artery without angina pectoris: Secondary | ICD-10-CM | POA: Diagnosis not present

## 2014-09-26 DIAGNOSIS — I10 Essential (primary) hypertension: Secondary | ICD-10-CM | POA: Diagnosis not present

## 2014-09-26 DIAGNOSIS — M6281 Muscle weakness (generalized): Secondary | ICD-10-CM | POA: Diagnosis not present

## 2014-09-26 DIAGNOSIS — M545 Low back pain: Secondary | ICD-10-CM | POA: Diagnosis not present

## 2014-09-26 DIAGNOSIS — F0390 Unspecified dementia without behavioral disturbance: Secondary | ICD-10-CM | POA: Diagnosis not present

## 2014-09-26 DIAGNOSIS — I890 Lymphedema, not elsewhere classified: Secondary | ICD-10-CM | POA: Diagnosis not present

## 2014-09-27 DIAGNOSIS — M6281 Muscle weakness (generalized): Secondary | ICD-10-CM | POA: Diagnosis not present

## 2014-09-27 DIAGNOSIS — F0390 Unspecified dementia without behavioral disturbance: Secondary | ICD-10-CM | POA: Diagnosis not present

## 2014-09-27 DIAGNOSIS — M545 Low back pain: Secondary | ICD-10-CM | POA: Diagnosis not present

## 2014-09-27 DIAGNOSIS — I10 Essential (primary) hypertension: Secondary | ICD-10-CM | POA: Diagnosis not present

## 2014-09-27 DIAGNOSIS — I2510 Atherosclerotic heart disease of native coronary artery without angina pectoris: Secondary | ICD-10-CM | POA: Diagnosis not present

## 2014-09-27 DIAGNOSIS — Z9181 History of falling: Secondary | ICD-10-CM | POA: Diagnosis not present

## 2014-09-27 DIAGNOSIS — Z8701 Personal history of pneumonia (recurrent): Secondary | ICD-10-CM | POA: Diagnosis not present

## 2014-09-27 DIAGNOSIS — I890 Lymphedema, not elsewhere classified: Secondary | ICD-10-CM | POA: Diagnosis not present

## 2014-09-27 DIAGNOSIS — N390 Urinary tract infection, site not specified: Secondary | ICD-10-CM | POA: Diagnosis not present

## 2014-09-27 LAB — CULTURE, BLOOD (ROUTINE X 2)
Culture: NO GROWTH
Culture: NO GROWTH

## 2014-10-01 DIAGNOSIS — J189 Pneumonia, unspecified organism: Secondary | ICD-10-CM | POA: Diagnosis not present

## 2014-10-01 DIAGNOSIS — D638 Anemia in other chronic diseases classified elsewhere: Secondary | ICD-10-CM | POA: Diagnosis not present

## 2014-10-01 DIAGNOSIS — Z951 Presence of aortocoronary bypass graft: Secondary | ICD-10-CM | POA: Diagnosis not present

## 2014-10-01 DIAGNOSIS — Z79899 Other long term (current) drug therapy: Secondary | ICD-10-CM | POA: Diagnosis not present

## 2014-10-01 DIAGNOSIS — Z111 Encounter for screening for respiratory tuberculosis: Secondary | ICD-10-CM | POA: Diagnosis not present

## 2014-10-01 DIAGNOSIS — R531 Weakness: Secondary | ICD-10-CM | POA: Diagnosis not present

## 2014-10-02 DIAGNOSIS — M545 Low back pain: Secondary | ICD-10-CM | POA: Diagnosis not present

## 2014-10-02 DIAGNOSIS — Z9181 History of falling: Secondary | ICD-10-CM | POA: Diagnosis not present

## 2014-10-02 DIAGNOSIS — F0390 Unspecified dementia without behavioral disturbance: Secondary | ICD-10-CM | POA: Diagnosis not present

## 2014-10-02 DIAGNOSIS — N390 Urinary tract infection, site not specified: Secondary | ICD-10-CM | POA: Diagnosis not present

## 2014-10-02 DIAGNOSIS — I10 Essential (primary) hypertension: Secondary | ICD-10-CM | POA: Diagnosis not present

## 2014-10-02 DIAGNOSIS — I2510 Atherosclerotic heart disease of native coronary artery without angina pectoris: Secondary | ICD-10-CM | POA: Diagnosis not present

## 2014-10-02 DIAGNOSIS — Z8701 Personal history of pneumonia (recurrent): Secondary | ICD-10-CM | POA: Diagnosis not present

## 2014-10-02 DIAGNOSIS — I890 Lymphedema, not elsewhere classified: Secondary | ICD-10-CM | POA: Diagnosis not present

## 2014-10-02 DIAGNOSIS — M6281 Muscle weakness (generalized): Secondary | ICD-10-CM | POA: Diagnosis not present

## 2014-10-03 DIAGNOSIS — M6281 Muscle weakness (generalized): Secondary | ICD-10-CM | POA: Diagnosis not present

## 2014-10-03 DIAGNOSIS — F0390 Unspecified dementia without behavioral disturbance: Secondary | ICD-10-CM | POA: Diagnosis not present

## 2014-10-03 DIAGNOSIS — N390 Urinary tract infection, site not specified: Secondary | ICD-10-CM | POA: Diagnosis not present

## 2014-10-03 DIAGNOSIS — Z8701 Personal history of pneumonia (recurrent): Secondary | ICD-10-CM | POA: Diagnosis not present

## 2014-10-03 DIAGNOSIS — I10 Essential (primary) hypertension: Secondary | ICD-10-CM | POA: Diagnosis not present

## 2014-10-03 DIAGNOSIS — I890 Lymphedema, not elsewhere classified: Secondary | ICD-10-CM | POA: Diagnosis not present

## 2014-10-03 DIAGNOSIS — M545 Low back pain: Secondary | ICD-10-CM | POA: Diagnosis not present

## 2014-10-03 DIAGNOSIS — Z9181 History of falling: Secondary | ICD-10-CM | POA: Diagnosis not present

## 2014-10-03 DIAGNOSIS — I2510 Atherosclerotic heart disease of native coronary artery without angina pectoris: Secondary | ICD-10-CM | POA: Diagnosis not present

## 2014-10-10 DIAGNOSIS — N390 Urinary tract infection, site not specified: Secondary | ICD-10-CM | POA: Diagnosis not present

## 2014-10-10 DIAGNOSIS — I10 Essential (primary) hypertension: Secondary | ICD-10-CM | POA: Diagnosis not present

## 2014-10-10 DIAGNOSIS — I890 Lymphedema, not elsewhere classified: Secondary | ICD-10-CM | POA: Diagnosis not present

## 2014-10-10 DIAGNOSIS — I2510 Atherosclerotic heart disease of native coronary artery without angina pectoris: Secondary | ICD-10-CM | POA: Diagnosis not present

## 2014-10-10 DIAGNOSIS — Z9181 History of falling: Secondary | ICD-10-CM | POA: Diagnosis not present

## 2014-10-10 DIAGNOSIS — F0390 Unspecified dementia without behavioral disturbance: Secondary | ICD-10-CM | POA: Diagnosis not present

## 2014-10-10 DIAGNOSIS — M545 Low back pain: Secondary | ICD-10-CM | POA: Diagnosis not present

## 2014-10-10 DIAGNOSIS — Z8701 Personal history of pneumonia (recurrent): Secondary | ICD-10-CM | POA: Diagnosis not present

## 2014-10-10 DIAGNOSIS — M6281 Muscle weakness (generalized): Secondary | ICD-10-CM | POA: Diagnosis not present

## 2014-10-12 DIAGNOSIS — M545 Low back pain: Secondary | ICD-10-CM | POA: Diagnosis not present

## 2014-10-12 DIAGNOSIS — Z8701 Personal history of pneumonia (recurrent): Secondary | ICD-10-CM | POA: Diagnosis not present

## 2014-10-12 DIAGNOSIS — I2510 Atherosclerotic heart disease of native coronary artery without angina pectoris: Secondary | ICD-10-CM | POA: Diagnosis not present

## 2014-10-12 DIAGNOSIS — M6281 Muscle weakness (generalized): Secondary | ICD-10-CM | POA: Diagnosis not present

## 2014-10-12 DIAGNOSIS — F0390 Unspecified dementia without behavioral disturbance: Secondary | ICD-10-CM | POA: Diagnosis not present

## 2014-10-12 DIAGNOSIS — I890 Lymphedema, not elsewhere classified: Secondary | ICD-10-CM | POA: Diagnosis not present

## 2014-10-12 DIAGNOSIS — N390 Urinary tract infection, site not specified: Secondary | ICD-10-CM | POA: Diagnosis not present

## 2014-10-12 DIAGNOSIS — Z9181 History of falling: Secondary | ICD-10-CM | POA: Diagnosis not present

## 2014-10-12 DIAGNOSIS — I10 Essential (primary) hypertension: Secondary | ICD-10-CM | POA: Diagnosis not present

## 2014-10-16 ENCOUNTER — Ambulatory Visit (INDEPENDENT_AMBULATORY_CARE_PROVIDER_SITE_OTHER): Payer: Commercial Managed Care - HMO | Admitting: Cardiovascular Disease

## 2014-10-16 ENCOUNTER — Encounter: Payer: Self-pay | Admitting: Cardiovascular Disease

## 2014-10-16 VITALS — BP 117/70 | HR 72 | Ht 62.5 in | Wt 128.0 lb

## 2014-10-16 DIAGNOSIS — I10 Essential (primary) hypertension: Secondary | ICD-10-CM | POA: Diagnosis not present

## 2014-10-16 DIAGNOSIS — N183 Chronic kidney disease, stage 3 unspecified: Secondary | ICD-10-CM | POA: Insufficient documentation

## 2014-10-16 DIAGNOSIS — E860 Dehydration: Secondary | ICD-10-CM

## 2014-10-16 DIAGNOSIS — I25700 Atherosclerosis of coronary artery bypass graft(s), unspecified, with unstable angina pectoris: Secondary | ICD-10-CM

## 2014-10-16 DIAGNOSIS — I129 Hypertensive chronic kidney disease with stage 1 through stage 4 chronic kidney disease, or unspecified chronic kidney disease: Secondary | ICD-10-CM | POA: Diagnosis not present

## 2014-10-16 DIAGNOSIS — E785 Hyperlipidemia, unspecified: Secondary | ICD-10-CM

## 2014-10-16 NOTE — Assessment & Plan Note (Signed)
We have recommended that she decrease her torsemide down to every other day, decrease potassium down from 40 mEq daily down to 39 equivalents every other day

## 2014-10-16 NOTE — Assessment & Plan Note (Signed)
Currently with no symptoms of angina. No further workup at this time. Continue current medication regimen. 

## 2014-10-16 NOTE — Assessment & Plan Note (Signed)
Cholesterol is at goal on the current lipid regimen. No changes to the medications were made.  

## 2014-10-16 NOTE — Progress Notes (Signed)
Patient ID: [name], female    DOB: 10-11-1922, 81 y.o.   MRN: [id number]  HPI Comments: 81 year-old woman with a history of bypass surgery in 06/27/2001, stress test April 2010 showing inferior wall ischemia with cardiac catheterization at that time showing occlusion of her native RCA and occlusion of her vein graft to the PDA with collateralized vessels from left to right, mild carotid arterial disease,  who presents for routine followup of her coronary artery disease.  She has a history of diverticular disease and significant GI bleeding. Previous history of falls.   In follow-up, she reports that she feels well. In January 2016 had urinary tract infection, hospitalization, hyperkalemia, abnormal sodium In August 2016 had fever, possible pneumonia, dehydration with creatinine 1.5. Daughter reports she has frequent urinary tract infections Also frequent falls. Denies any leg edema, no shortness of breath concerning for CHF  EKG on today's visit shows normal sinus rhythm with rate 72 bpm, no significant ST or T-wave changes  Other past medical history Previously had lymphedema symptoms, improved with a pump She walks with a walker.  She's not doing any regular exercise.  Previous cortisone shots to her back and hips with significant improvement of her pain.  Her husband died In 06/28/2011. He died of complications while in the hospital.    episode of chest pain in November 2014. She was evaluated in the hospital, ruled out for MI, had a stress test that showed no ischemia on 12/30/2012. She was started on sucralfate and symptoms have resolved.   Cholesterol higher at the end of 2012-06-27 when she was off her simvastatin. Total cholesterol that time 250.   Cholesterol in 05-19-14was 212, up from 170 in 06/28/11.     Allergies  Allergen Reactions  . Iodine     Rash/hives  . Tetanus Toxoids Other (See Comments)    From nursing home [facility]...side effects unknown    Outpatient Encounter  Prescriptions as of 10/16/2014  Medication Sig  . acetaminophen (TYLENOL) 500 MG tablet Take 500 mg by mouth every 4 (four) hours as needed for mild pain or fever.  . ALPRAZolam (XANAX) 1 MG tablet Take 1 mg by mouth at bedtime. For anxiety  . amLODipine (NORVASC) 2.5 MG tablet Take 2.5 mg by mouth every morning.   Marland Kitchen aspirin 81 MG EC tablet Take 81 mg by mouth daily.    Marland Kitchen docusate sodium (COLACE) 100 MG capsule Take 100 mg by mouth daily. At 4 pm  . gabapentin (NEURONTIN) 100 MG capsule Take 100 mg by mouth 3 (three) times daily.   Marland Kitchen guaifenesin (ROBITUSSIN) 100 MG/5ML syrup Take 200 mg by mouth every 6 (six) hours as needed for cough.  Marland Kitchen HYDROcodone-acetaminophen (NORCO) 10-325 MG per tablet Take 1 tablet by mouth every 4 (four) hours as needed for moderate pain.  Marland Kitchen levothyroxine (SYNTHROID, LEVOTHROID) 100 MCG tablet Take 100 mcg by mouth daily.  Marland Kitchen loperamide (IMODIUM) 2 MG capsule Take 2 mg by mouth as needed for diarrhea or loose stools.  . metoprolol succinate (TOPROL-XL) 50 MG 24 hr tablet Take 50 mg by mouth daily.  . mirtazapine (REMERON) 7.5 MG tablet Take 7.5 mg by mouth at bedtime.  Marland Kitchen neomycin-bacitracin-polymyxin (NEOSPORIN) [phone] ointment Apply 1 application topically as needed (skin tears, abrasions, or minor irritations).  Marland Kitchen omeprazole (PRILOSEC) 20 MG capsule Take 20 mg by mouth daily.  . pantoprazole (PROTONIX) 40 MG tablet Take 40 mg by mouth daily.  . potassium chloride (KLOR-CON) 20  MEQ packet Take 20 mEq by mouth 2 (two) times daily.  . simvastatin (ZOCOR) 40 MG tablet Take 40 mg by mouth at bedtime.    . sucralfate (CARAFATE) 1 G tablet Take 1 g by mouth daily.   Marland Kitchen torsemide (DEMADEX) 10 MG tablet Take 10 mg by mouth daily.  . vitamin B-12 (CYANOCOBALAMIN) 1000 MCG tablet Take 1,000 mcg by mouth every morning.   . [DISCONTINUED] alum & mag hydroxide-simeth (MAALOX/MYLANTA) 200-200-20 MG/5ML suspension Take 30 mLs by mouth every 6 (six) hours as needed for indigestion or  heartburn.  . [DISCONTINUED] cefUROXime (CEFTIN) 250 MG tablet Take 1 tablet (250 mg total) by mouth 2 (two) times daily with a meal. (Patient not taking: Reported on 10/16/2014)  . [DISCONTINUED] magnesium hydroxide (MILK OF MAGNESIA) 400 MG/5ML suspension Take 30 mLs by mouth daily as needed for mild constipation.  . [DISCONTINUED] senna-docusate (SENOKOT-S) 8.6-50 MG per tablet Take 1 tablet by mouth 2 (two) times daily as needed for mild constipation.   No facility-administered encounter medications on file as of 10/16/2014.    Past Medical History  Diagnosis Date  . Hyperlipidemia   . Hypertension   . Hypothyroidism   . GERD (gastroesophageal reflux disease)   . Pneumonia   . Lymphedema   . CKD (chronic kidney disease), stage III     Past Surgical History  Procedure Laterality Date  . Cholecystectomy    . Abdominal hysterectomy    . Coronary artery bypass graft      Social History  reports that she has never smoked. She has never used smokeless tobacco. She reports that she does not drink alcohol or use illicit drugs.  Family History Family history is unknown by patient.  Review of Systems  Constitutional: Negative.   Respiratory: Negative.   Cardiovascular: Negative.   Gastrointestinal: Negative.   Musculoskeletal: Positive for back pain and gait problem.  Skin: Negative.   Neurological: Negative.   Hematological: Negative.   Psychiatric/Behavioral: Negative.   All other systems reviewed and are negative.   BP 117/70 mmHg  Pulse 72  Ht 5' 2.5" (1.588 m)  Wt 128 lb (58.06 kg)  BMI 23.02 kg/m2  Physical Exam  Constitutional: She is oriented to person, place, and time. She appears well-developed and well-nourished.  HENT:  Head: Normocephalic.  Nose: Nose normal.  Mouth/Throat: Oropharynx is clear and moist.  Eyes: Conjunctivae are normal. Pupils are equal, round, and reactive to light.  Neck: Normal range of motion. Neck supple. No JVD present.   Cardiovascular: Normal rate, regular rhythm, S1 normal, S2 normal, normal heart sounds and intact distal pulses.  Exam reveals no gallop and no friction rub.   No murmur heard. Pulmonary/Chest: Effort normal and breath sounds normal. No respiratory distress. She has no wheezes. She has no rales. She exhibits no tenderness.  Abdominal: Soft. Bowel sounds are normal. She exhibits no distension. There is no tenderness.  Musculoskeletal: Normal range of motion. She exhibits no edema or tenderness.  Lymphadenopathy:    She has no cervical adenopathy.  Neurological: She is alert and oriented to person, place, and time. Coordination normal.  Skin: Skin is warm and dry. No rash noted. No erythema.  Psychiatric: She has a normal mood and affect. Her behavior is normal. Judgment and thought content normal.    Assessment and Plan  Nursing note and vitals reviewed.

## 2014-10-16 NOTE — Assessment & Plan Note (Signed)
Blood pressure is well controlled on today's visit. No changes made to the medications. 

## 2014-10-16 NOTE — Assessment & Plan Note (Signed)
Recent creatinine 1.5, above her baseline. Recommended she decrease her torsemide as above. She's not drinking much fluids If creatinine continues to run high with no signs of congestive heart failure, could hold torsemide and take only as needed

## 2014-10-16 NOTE — Patient Instructions (Signed)
You are doing well.  Please change the torsemide to 10 mg on Monday, Wednesday, Friday and Saturday  Please take docusate to daily as needed (PRN)  Please call us if you have new issues that need to be addressed before your next appt.  Your physician wants you to follow-up in: 6 months.  You will receive a reminder letter in the mail two months in advance. If you don't receive a letter, please call our office to schedule the follow-up appointment.

## 2014-10-16 NOTE — Assessment & Plan Note (Deleted)
Blood pressure is well controlled on today's visit. No changes made to the medications. 

## 2014-10-17 DIAGNOSIS — F0390 Unspecified dementia without behavioral disturbance: Secondary | ICD-10-CM | POA: Diagnosis not present

## 2014-10-17 DIAGNOSIS — Z9181 History of falling: Secondary | ICD-10-CM | POA: Diagnosis not present

## 2014-10-17 DIAGNOSIS — N390 Urinary tract infection, site not specified: Secondary | ICD-10-CM | POA: Diagnosis not present

## 2014-10-17 DIAGNOSIS — M545 Low back pain: Secondary | ICD-10-CM | POA: Diagnosis not present

## 2014-10-17 DIAGNOSIS — I10 Essential (primary) hypertension: Secondary | ICD-10-CM | POA: Diagnosis not present

## 2014-10-17 DIAGNOSIS — Z8701 Personal history of pneumonia (recurrent): Secondary | ICD-10-CM | POA: Diagnosis not present

## 2014-10-17 DIAGNOSIS — I890 Lymphedema, not elsewhere classified: Secondary | ICD-10-CM | POA: Diagnosis not present

## 2014-10-17 DIAGNOSIS — I2510 Atherosclerotic heart disease of native coronary artery without angina pectoris: Secondary | ICD-10-CM | POA: Diagnosis not present

## 2014-10-17 DIAGNOSIS — M6281 Muscle weakness (generalized): Secondary | ICD-10-CM | POA: Diagnosis not present

## 2014-10-18 DIAGNOSIS — N390 Urinary tract infection, site not specified: Secondary | ICD-10-CM | POA: Diagnosis not present

## 2014-10-18 DIAGNOSIS — I10 Essential (primary) hypertension: Secondary | ICD-10-CM | POA: Diagnosis not present

## 2014-10-18 DIAGNOSIS — I2510 Atherosclerotic heart disease of native coronary artery without angina pectoris: Secondary | ICD-10-CM | POA: Diagnosis not present

## 2014-10-18 DIAGNOSIS — M545 Low back pain: Secondary | ICD-10-CM | POA: Diagnosis not present

## 2014-10-18 DIAGNOSIS — Z9181 History of falling: Secondary | ICD-10-CM | POA: Diagnosis not present

## 2014-10-18 DIAGNOSIS — M6281 Muscle weakness (generalized): Secondary | ICD-10-CM | POA: Diagnosis not present

## 2014-10-18 DIAGNOSIS — I890 Lymphedema, not elsewhere classified: Secondary | ICD-10-CM | POA: Diagnosis not present

## 2014-10-18 DIAGNOSIS — Z8701 Personal history of pneumonia (recurrent): Secondary | ICD-10-CM | POA: Diagnosis not present

## 2014-10-18 DIAGNOSIS — F0390 Unspecified dementia without behavioral disturbance: Secondary | ICD-10-CM | POA: Diagnosis not present

## 2014-10-22 ENCOUNTER — Ambulatory Visit: Payer: Commercial Managed Care - HMO | Admitting: Cardiovascular Disease

## 2014-10-24 DIAGNOSIS — M6281 Muscle weakness (generalized): Secondary | ICD-10-CM | POA: Diagnosis not present

## 2014-10-24 DIAGNOSIS — Z8701 Personal history of pneumonia (recurrent): Secondary | ICD-10-CM | POA: Diagnosis not present

## 2014-10-24 DIAGNOSIS — I10 Essential (primary) hypertension: Secondary | ICD-10-CM | POA: Diagnosis not present

## 2014-10-24 DIAGNOSIS — F0390 Unspecified dementia without behavioral disturbance: Secondary | ICD-10-CM | POA: Diagnosis not present

## 2014-10-24 DIAGNOSIS — M545 Low back pain: Secondary | ICD-10-CM | POA: Diagnosis not present

## 2014-10-24 DIAGNOSIS — I2510 Atherosclerotic heart disease of native coronary artery without angina pectoris: Secondary | ICD-10-CM | POA: Diagnosis not present

## 2014-10-24 DIAGNOSIS — I890 Lymphedema, not elsewhere classified: Secondary | ICD-10-CM | POA: Diagnosis not present

## 2014-10-24 DIAGNOSIS — N390 Urinary tract infection, site not specified: Secondary | ICD-10-CM | POA: Diagnosis not present

## 2014-10-24 DIAGNOSIS — Z9181 History of falling: Secondary | ICD-10-CM | POA: Diagnosis not present

## 2014-10-25 DIAGNOSIS — Z8701 Personal history of pneumonia (recurrent): Secondary | ICD-10-CM | POA: Diagnosis not present

## 2014-10-25 DIAGNOSIS — F0390 Unspecified dementia without behavioral disturbance: Secondary | ICD-10-CM | POA: Diagnosis not present

## 2014-10-25 DIAGNOSIS — I890 Lymphedema, not elsewhere classified: Secondary | ICD-10-CM | POA: Diagnosis not present

## 2014-10-25 DIAGNOSIS — N390 Urinary tract infection, site not specified: Secondary | ICD-10-CM | POA: Diagnosis not present

## 2014-10-25 DIAGNOSIS — I2510 Atherosclerotic heart disease of native coronary artery without angina pectoris: Secondary | ICD-10-CM | POA: Diagnosis not present

## 2014-10-25 DIAGNOSIS — M6281 Muscle weakness (generalized): Secondary | ICD-10-CM | POA: Diagnosis not present

## 2014-10-25 DIAGNOSIS — Z9181 History of falling: Secondary | ICD-10-CM | POA: Diagnosis not present

## 2014-10-25 DIAGNOSIS — I10 Essential (primary) hypertension: Secondary | ICD-10-CM | POA: Diagnosis not present

## 2014-10-25 DIAGNOSIS — M545 Low back pain: Secondary | ICD-10-CM | POA: Diagnosis not present

## 2014-10-29 DIAGNOSIS — I10 Essential (primary) hypertension: Secondary | ICD-10-CM | POA: Diagnosis not present

## 2014-10-29 DIAGNOSIS — Z23 Encounter for immunization: Secondary | ICD-10-CM | POA: Diagnosis not present

## 2014-10-29 DIAGNOSIS — M79671 Pain in right foot: Secondary | ICD-10-CM | POA: Diagnosis not present

## 2014-10-29 DIAGNOSIS — N183 Chronic kidney disease, stage 3 (moderate): Secondary | ICD-10-CM | POA: Diagnosis not present

## 2014-10-29 DIAGNOSIS — D638 Anemia in other chronic diseases classified elsewhere: Secondary | ICD-10-CM | POA: Diagnosis not present

## 2014-10-29 DIAGNOSIS — M79672 Pain in left foot: Secondary | ICD-10-CM | POA: Diagnosis not present

## 2014-10-29 DIAGNOSIS — M1990 Unspecified osteoarthritis, unspecified site: Secondary | ICD-10-CM | POA: Diagnosis not present

## 2014-10-29 DIAGNOSIS — G894 Chronic pain syndrome: Secondary | ICD-10-CM | POA: Diagnosis not present

## 2014-10-30 DIAGNOSIS — F0390 Unspecified dementia without behavioral disturbance: Secondary | ICD-10-CM | POA: Diagnosis not present

## 2014-10-30 DIAGNOSIS — M6281 Muscle weakness (generalized): Secondary | ICD-10-CM | POA: Diagnosis not present

## 2014-10-30 DIAGNOSIS — Z9181 History of falling: Secondary | ICD-10-CM | POA: Diagnosis not present

## 2014-10-30 DIAGNOSIS — Z8701 Personal history of pneumonia (recurrent): Secondary | ICD-10-CM | POA: Diagnosis not present

## 2014-10-30 DIAGNOSIS — I2510 Atherosclerotic heart disease of native coronary artery without angina pectoris: Secondary | ICD-10-CM | POA: Diagnosis not present

## 2014-10-30 DIAGNOSIS — I890 Lymphedema, not elsewhere classified: Secondary | ICD-10-CM | POA: Diagnosis not present

## 2014-10-30 DIAGNOSIS — M545 Low back pain: Secondary | ICD-10-CM | POA: Diagnosis not present

## 2014-10-30 DIAGNOSIS — N390 Urinary tract infection, site not specified: Secondary | ICD-10-CM | POA: Diagnosis not present

## 2014-10-30 DIAGNOSIS — I10 Essential (primary) hypertension: Secondary | ICD-10-CM | POA: Diagnosis not present

## 2014-11-01 DIAGNOSIS — F0390 Unspecified dementia without behavioral disturbance: Secondary | ICD-10-CM | POA: Diagnosis not present

## 2014-11-01 DIAGNOSIS — Z9181 History of falling: Secondary | ICD-10-CM | POA: Diagnosis not present

## 2014-11-01 DIAGNOSIS — I890 Lymphedema, not elsewhere classified: Secondary | ICD-10-CM | POA: Diagnosis not present

## 2014-11-01 DIAGNOSIS — N390 Urinary tract infection, site not specified: Secondary | ICD-10-CM | POA: Diagnosis not present

## 2014-11-01 DIAGNOSIS — I2510 Atherosclerotic heart disease of native coronary artery without angina pectoris: Secondary | ICD-10-CM | POA: Diagnosis not present

## 2014-11-01 DIAGNOSIS — M6281 Muscle weakness (generalized): Secondary | ICD-10-CM | POA: Diagnosis not present

## 2014-11-01 DIAGNOSIS — Z8701 Personal history of pneumonia (recurrent): Secondary | ICD-10-CM | POA: Diagnosis not present

## 2014-11-01 DIAGNOSIS — I10 Essential (primary) hypertension: Secondary | ICD-10-CM | POA: Diagnosis not present

## 2014-11-01 DIAGNOSIS — M545 Low back pain: Secondary | ICD-10-CM | POA: Diagnosis not present

## 2014-11-04 ENCOUNTER — Emergency Department: Payer: Commercial Managed Care - HMO

## 2014-11-04 ENCOUNTER — Emergency Department
Admission: EM | Admit: 2014-11-04 | Discharge: 2014-11-04 | Disposition: A | Discharge status: Home / Self Care | Payer: Commercial Managed Care - HMO | Attending: Student | Admitting: Student

## 2014-11-04 DIAGNOSIS — I129 Hypertensive chronic kidney disease with stage 1 through stage 4 chronic kidney disease, or unspecified chronic kidney disease: Secondary | ICD-10-CM | POA: Diagnosis not present

## 2014-11-04 DIAGNOSIS — Z79899 Other long term (current) drug therapy: Secondary | ICD-10-CM | POA: Insufficient documentation

## 2014-11-04 DIAGNOSIS — M109 Gout, unspecified: Secondary | ICD-10-CM | POA: Diagnosis not present

## 2014-11-04 DIAGNOSIS — R1030 Lower abdominal pain, unspecified: Secondary | ICD-10-CM | POA: Diagnosis not present

## 2014-11-04 DIAGNOSIS — N183 Chronic kidney disease, stage 3 (moderate): Secondary | ICD-10-CM | POA: Diagnosis not present

## 2014-11-04 DIAGNOSIS — R1031 Right lower quadrant pain: Secondary | ICD-10-CM | POA: Diagnosis not present

## 2014-11-04 DIAGNOSIS — R109 Unspecified abdominal pain: Secondary | ICD-10-CM

## 2014-11-04 DIAGNOSIS — Z7982 Long term (current) use of aspirin: Secondary | ICD-10-CM | POA: Diagnosis not present

## 2014-11-04 DIAGNOSIS — J90 Pleural effusion, not elsewhere classified: Secondary | ICD-10-CM | POA: Diagnosis not present

## 2014-11-04 LAB — CBC WITH DIFFERENTIAL/PLATELET
BASOS PCT: 1 %
Basophils Absolute: 0 10*3/uL (ref 0–0.1)
EOS ABS: 0.3 10*3/uL (ref 0–0.7)
Eosinophils Relative: 4 %
HCT: 31.6 % — ABNORMAL LOW (ref 35.0–47.0)
HEMOGLOBIN: 10.6 g/dL — AB (ref 12.0–16.0)
Lymphocytes Relative: 27 %
Lymphs Abs: 1.6 10*3/uL (ref 1.0–3.6)
MCH: 30.1 pg (ref 26.0–34.0)
MCHC: 33.5 g/dL (ref 32.0–36.0)
MCV: 89.9 fL (ref 80.0–100.0)
Monocytes Absolute: 0.7 10*3/uL (ref 0.2–0.9)
Monocytes Relative: 11 %
Neutro Abs: 3.3 10*3/uL (ref 1.4–6.5)
Neutrophils Relative %: 57 %
Platelets: 235 10*3/uL (ref 150–440)
RBC: 3.52 MIL/uL — AB (ref 3.80–5.20)
RDW: 13.9 % (ref 11.5–14.5)
WBC: 5.9 10*3/uL (ref 3.6–11.0)

## 2014-11-04 LAB — COMPREHENSIVE METABOLIC PANEL
ALBUMIN: 3.8 g/dL (ref 3.5–5.0)
ALK PHOS: 82 U/L (ref 38–126)
ALT: 16 U/L (ref 14–54)
ANION GAP: 7 (ref 5–15)
AST: 27 U/L (ref 15–41)
BUN: 34 mg/dL — ABNORMAL HIGH (ref 6–20)
CALCIUM: 9.9 mg/dL (ref 8.9–10.3)
CO2: 25 mmol/L (ref 22–32)
Chloride: 105 mmol/L (ref 101–111)
Creatinine, Ser: 1.28 mg/dL — ABNORMAL HIGH (ref 0.44–1.00)
GFR calc Af Amer: 42 mL/min — ABNORMAL LOW (ref >60)
GFR calc non Af Amer: 36 mL/min — ABNORMAL LOW (ref >60)
GLUCOSE: 107 mg/dL — AB (ref 65–99)
Potassium: 4.3 mmol/L (ref 3.5–5.1)
SODIUM: 137 mmol/L (ref 135–145)
Total Bilirubin: 0.6 mg/dL (ref 0.3–1.2)
Total Protein: 7.1 g/dL (ref 6.5–8.1)

## 2014-11-04 LAB — LIPASE, BLOOD: Lipase: 26 U/L (ref 22–51)

## 2014-11-04 LAB — URINALYSIS COMPLETE WITH MICROSCOPIC (ARMC ONLY)
BACTERIA UA: NONE SEEN
Bilirubin Urine: NEGATIVE
GLUCOSE, UA: NEGATIVE mg/dL
HGB URINE DIPSTICK: NEGATIVE
Ketones, ur: NEGATIVE mg/dL
Leukocytes, UA: NEGATIVE
NITRITE: NEGATIVE
PH: 5 (ref 5.0–8.0)
PROTEIN: NEGATIVE mg/dL
Specific Gravity, Urine: 1.011 (ref 1.005–1.030)
Squamous Epithelial / LPF: NONE SEEN

## 2014-11-04 LAB — TROPONIN I

## 2014-11-04 MED ORDER — BARIUM SULFATE 2.1 % PO SUSP
450.0000 mL | ORAL | Status: AC
  Administered 2014-11-04: 450 mL via ORAL

## 2014-11-04 MED ORDER — SODIUM CHLORIDE 0.9 % IV BOLUS (SEPSIS): 500.0000 mL | Freq: Once | INTRAVENOUS | Status: DC

## 2014-11-04 MED ORDER — ONDANSETRON HCL 4 MG/2ML IJ SOLN
4.0000 mg | Freq: Once | INTRAMUSCULAR | Status: AC
  Administered 2014-11-04: 4 mg via INTRAVENOUS
  Filled 2014-11-04: qty 2

## 2014-11-04 NOTE — ED Provider Notes (Signed)
I was asked by Dr. Dondra Spry to follow up on CT scan and chest x-ray. Both are unremarkable. Discussed with patient the need for follow-up with her primary care physician and likely orthopedics for possible arthritis associated pain. She agrees with the plan. She knows to return if any change in her symptoms  Jene Every, MD 11/04/14 [phone]

## 2014-11-04 NOTE — ED Provider Notes (Signed)
[facility] Emergency Department Provider Note  ____________________________________________  Time seen: Approximately 12:09 PM  I have reviewed the triage vital signs and the nursing notes.   HISTORY  Chief Complaint No chief complaint on file.    HPI Talibah Colasurdo is a 81 y.o. female with history of coronary artery disease status post CABG, gout, chronic kidney disease, hypertension, hyperlipidemia and GERD who presents for evaluation of right groin pain which began this morning, gradual onset, intermittent. She has also had one episode of nonbloody nonbilious emesis and has been nauseated. No chest pain or difficulty breathing. No fall/trauma. Currently she denies any pain. No modifying factors. Of note, the patient was diagnosed with gout last week and started on allopurinol however developed a rash and was switched to colchicine with improvement in her rash and her gout symptoms.    Past Medical History  Diagnosis Date  . Hyperlipidemia   . Hypertension   . Hypothyroidism   . GERD (gastroesophageal reflux disease)   . Pneumonia   . Lymphedema   . CKD (chronic kidney disease), stage III     Patient Active Problem List   Diagnosis Date Noted  . Benign hypertension with CKD (chronic kidney disease) stage III 10/16/2014  . Stage III chronic kidney disease 10/16/2014  . Dehydration 10/16/2014  . Sepsis 09/22/2014  . Hypoxia 09/22/2014  . Healthcare-associated pneumonia 09/22/2014  . ARF (acute renal failure) 09/22/2014  . Lactic acid acidosis 09/22/2014  . Frequent falls 09/06/2013  . Ankle edema 09/30/2012  . CAD, ARTERY BYPASS GRAFT 06/21/2009  . HYPOTHYROIDISM 07/17/2008  . Hyperlipidemia 07/17/2008  . HTN (hypertension) 07/17/2008  . GERD 07/17/2008  . DYSPNEA 07/17/2008  . CHEST PAIN-UNSPECIFIED 07/17/2008    Past Surgical History  Procedure Laterality Date  . Cholecystectomy    . Abdominal hysterectomy    . Coronary artery bypass  graft      Current Outpatient Rx  Name  Route  Sig  Dispense  Refill  . acetaminophen (TYLENOL) 500 MG tablet   Oral   Take 500 mg by mouth every 4 (four) hours as needed for mild pain or fever.         . ALPRAZolam (XANAX) 1 MG tablet   Oral   Take 1 mg by mouth at bedtime. For anxiety         . amLODipine (NORVASC) 2.5 MG tablet   Oral   Take 2.5 mg by mouth every morning.          Marland Kitchen aspirin 81 MG EC tablet   Oral   Take 81 mg by mouth daily.           Marland Kitchen docusate sodium (COLACE) 100 MG capsule   Oral   Take 100 mg by mouth daily. At 4 pm         . gabapentin (NEURONTIN) 100 MG capsule   Oral   Take 100 mg by mouth 3 (three) times daily.          Marland Kitchen guaifenesin (ROBITUSSIN) 100 MG/5ML syrup   Oral   Take 200 mg by mouth every 6 (six) hours as needed for cough.         Marland Kitchen HYDROcodone-acetaminophen (NORCO) 10-325 MG per tablet   Oral   Take 1 tablet by mouth every 4 (four) hours as needed for moderate pain.         Marland Kitchen levothyroxine (SYNTHROID, LEVOTHROID) 100 MCG tablet   Oral   Take 100 mcg by mouth daily.         Marland Kitchen  loperamide (IMODIUM) 2 MG capsule   Oral   Take 2 mg by mouth as needed for diarrhea or loose stools.         . metoprolol succinate (TOPROL-XL) 50 MG 24 hr tablet   Oral   Take 50 mg by mouth daily.         . mirtazapine (REMERON) 7.5 MG tablet   Oral   Take 7.5 mg by mouth at bedtime.         Marland Kitchen neomycin-bacitracin-polymyxin (NEOSPORIN) [phone] ointment   Topical   Apply 1 application topically as needed (skin tears, abrasions, or minor irritations).         Marland Kitchen omeprazole (PRILOSEC) 20 MG capsule   Oral   Take 20 mg by mouth daily.         . pantoprazole (PROTONIX) 40 MG tablet   Oral   Take 40 mg by mouth daily.         . potassium chloride (KLOR-CON) 20 MEQ packet   Oral   Take 20 mEq by mouth 2 (two) times daily.   60 tablet   1   . simvastatin (ZOCOR) 40 MG tablet   Oral   Take 40 mg by mouth at  bedtime.           . sucralfate (CARAFATE) 1 G tablet   Oral   Take 1 g by mouth daily.          Marland Kitchen torsemide (DEMADEX) 10 MG tablet   Oral   Take 10 mg by mouth daily.         . vitamin B-12 (CYANOCOBALAMIN) 1000 MCG tablet   Oral   Take 1,000 mcg by mouth every morning.            Allergies Iodine and Tetanus toxoids  Family History  Problem Relation Age of Onset  . Family history unknown: Yes    Social History Social History  Substance Use Topics  . Smoking status: Never Smoker   . Smokeless tobacco: Never Used  . Alcohol Use: No    Review of Systems Constitutional: No fever/chills Eyes: No visual changes. ENT: No sore throat. Cardiovascular: Denies chest pain. Respiratory: Denies shortness of breath. Gastrointestinal: + rlq/groin pain pain.  No nausea, no vomiting.  No diarrhea.  No constipation. Genitourinary: Negative for dysuria. Musculoskeletal: Negative for back pain. Skin: Negative for rash. Neurological: Negative for headaches, focal weakness or numbness.  10-point ROS otherwise negative.  ____________________________________________   PHYSICAL EXAM:  VITAL SIGNS: ED Triage Vitals  Enc Vitals Group     BP 11/04/14 1034 128/57 mmHg     Pulse Rate 11/04/14 1034 63     Resp 11/04/14 1034 18     Temp 11/04/14 1034 97.7 F (36.5 C)     Temp Source 11/04/14 1034 Oral     SpO2 11/04/14 1034 97 %     Weight 11/04/14 1034 126 lb (57.153 kg)     Height 11/04/14 1034  (1.6 m)     Head Cir --      Peak Flow --      Pain Score 11/04/14 1036 9     Pain Loc --      Pain Edu? --      Excl. in GC? --     Constitutional: Alert and oriented. Well appearing and in no acute distress. Eyes: Conjunctivae are normal. PERRL. EOMI. Head: Atraumatic. Nose: No congestion/rhinnorhea. Mouth/Throat: Mucous membranes are moist.  Oropharynx non-erythematous. Neck: No stridor.  Cardiovascular: Normal rate, regular rhythm. Grossly normal heart sounds.   Good peripheral circulation. Respiratory: Normal respiratory effort.  No retractions. Lungs CTAB. Gastrointestinal: Soft with mild tender to palpation in the right groin/right lower quadrant. No CVA tenderness. Genitourinary: Deferred Musculoskeletal: No lower extremity tenderness nor edema.  No joint effusions. Full painless range of motion of bilateral hip joints. Neurologic:  Normal speech and language. No gross focal neurologic deficits are appreciated. Skin:  Skin is warm, dry and intact. No rash noted. Psychiatric: Mood and affect are normal. Speech and behavior are normal.  ____________________________________________   LABS (all labs ordered are listed, but only abnormal results are displayed)  Labs Reviewed  CBC WITH DIFFERENTIAL/PLATELET - Abnormal; Notable for the following:    RBC 3.52 (*)    Hemoglobin 10.6 (*)    HCT 31.6 (*)    All other components within normal limits  COMPREHENSIVE METABOLIC PANEL - Abnormal; Notable for the following:    Glucose, Bld 107 (*)    BUN 34 (*)    Creatinine, Ser 1.28 (*)    GFR calc non Af Amer 36 (*)    GFR calc Af Amer 42 (*)    All other components within normal limits  URINALYSIS COMPLETEWITH MICROSCOPIC ([facility] ONLY) - Abnormal; Notable for the following:    Color, Urine STRAW (*)    APPearance CLEAR (*)    All other components within normal limits  LIPASE, BLOOD  TROPONIN I   ____________________________________________  EKG  ED ECG REPORT I, Gayla Doss, the attending physician, personally viewed and interpreted this ECG.   Date: 11/04/2014  EKG Time: 15:03  Rate: 64  Rhythm: sinus rhythm  Axis: normal  Intervals: First-degree AV block  ST&T Change: No acute ST elevation. EKG unchanged when compared to 10/16/2014.  ____________________________________________  RADIOLOGY  CT abdomen and pelvis - pending  CXR - pending ____________________________________________   PROCEDURES  Procedure(s) performed:  None  Critical Care performed: No  ____________________________________________   INITIAL IMPRESSION / ASSESSMENT AND PLAN / ED COURSE  Pertinent labs & imaging results that were available during my care of the patient were reviewed by me and considered in my medical decision making (see chart for details).  Amberley Hamler is a 81 y.o. female with history of coronary artery disease status post CABG, gout, chronic kidney disease, hypertension, hyperlipidemia and GERD who presents for evaluation of right groin pain which began this morning, gradual onset, intermittent.  On exam, she is nontoxic appearing and in no acute distress. Vital signs stable, she is afebrile. She is nauseated but has not vomited since arrival to the emergency department she does have some tenderness to palpation in the right lower quadrant and right groin. Labs reviewed and are notable for stable chronic anemia with hemoglobin 10.6 which appears to be her baseline. Creatinine mildly elevated at 1.26 which also appears to be near her baseline. Normal lipase. Negative troponin. Urinalysis negative for any evidence of infection. Awaiting CT abdomen and pelvis to further evaluate cause of her pain, rule out obstruction, perforation, incarcerated hernia. Her daughter reports that she has had cough over the past 2 days and is requesting a chest x-ray which I  Have ordered. Care transferred to Dr. Cyril Loosen at 3:08 pm pending CT, CXR and final disposition. ____________________________________________   FINAL CLINICAL IMPRESSION(S) / ED DIAGNOSES  Final diagnoses:  Abdominal pain      Gayla Doss, MD 11/04/14 [phone]

## 2014-11-04 NOTE — ED Notes (Addendum)
Pt recently diagnosed with gout last week. Pt daughter reports uric acid level was elevated. Pt started allopurinol daily but had a rash, was switched to colchicine PRN. Pt denies having a gout attack since diagnosed. Pt presents today with right sided groin pain for five days. Pt denies difficulty with urination. Pt denies fever. Pt vomited twice this morning.

## 2014-11-04 NOTE — ED Notes (Signed)
Brought in  By family from [place]  havingpain to right groin area and had some n/v this am

## 2014-11-07 DIAGNOSIS — R1030 Lower abdominal pain, unspecified: Secondary | ICD-10-CM | POA: Diagnosis not present

## 2014-11-07 DIAGNOSIS — M7611 Psoas tendinitis, right hip: Secondary | ICD-10-CM | POA: Diagnosis not present

## 2014-11-30 DIAGNOSIS — R350 Frequency of micturition: Secondary | ICD-10-CM | POA: Diagnosis not present

## 2015-01-01 DIAGNOSIS — I10 Essential (primary) hypertension: Secondary | ICD-10-CM | POA: Diagnosis not present

## 2015-01-01 DIAGNOSIS — I2510 Atherosclerotic heart disease of native coronary artery without angina pectoris: Secondary | ICD-10-CM | POA: Diagnosis not present

## 2015-01-01 DIAGNOSIS — F5104 Psychophysiologic insomnia: Secondary | ICD-10-CM | POA: Diagnosis not present

## 2015-01-01 DIAGNOSIS — E039 Hypothyroidism, unspecified: Secondary | ICD-10-CM | POA: Diagnosis not present

## 2015-01-31 ENCOUNTER — Telehealth: Payer: Self-pay

## 2015-01-31 NOTE — Telephone Encounter (Signed)
[facility] signed by Dr. Mariah MillingGollan. Fax placed in MD box for signature.

## 2015-01-31 NOTE — Telephone Encounter (Signed)
Nurse has questions regarding pt medications. Torsemide and Potassium. Please call

## 2015-02-05 DIAGNOSIS — R350 Frequency of micturition: Secondary | ICD-10-CM | POA: Diagnosis not present

## 2015-02-20 DIAGNOSIS — R350 Frequency of micturition: Secondary | ICD-10-CM | POA: Diagnosis not present

## 2015-02-20 DIAGNOSIS — R300 Dysuria: Secondary | ICD-10-CM | POA: Diagnosis not present

## 2015-02-25 DIAGNOSIS — Z79899 Other long term (current) drug therapy: Secondary | ICD-10-CM | POA: Diagnosis not present

## 2015-02-25 DIAGNOSIS — N393 Stress incontinence (female) (male): Secondary | ICD-10-CM | POA: Diagnosis not present

## 2015-03-15 ENCOUNTER — Emergency Department
Admission: EM | Admit: 2015-03-15 | Discharge: 2015-03-15 | Disposition: A | Discharge status: Home / Self Care | Payer: Commercial Managed Care - HMO | Attending: Emergency Medicine | Admitting: Emergency Medicine

## 2015-03-15 ENCOUNTER — Emergency Department: Payer: Commercial Managed Care - HMO

## 2015-03-15 DIAGNOSIS — Z79899 Other long term (current) drug therapy: Secondary | ICD-10-CM | POA: Insufficient documentation

## 2015-03-15 DIAGNOSIS — Y9389 Activity, other specified: Secondary | ICD-10-CM | POA: Insufficient documentation

## 2015-03-15 DIAGNOSIS — I129 Hypertensive chronic kidney disease with stage 1 through stage 4 chronic kidney disease, or unspecified chronic kidney disease: Secondary | ICD-10-CM | POA: Insufficient documentation

## 2015-03-15 DIAGNOSIS — Y998 Other external cause status: Secondary | ICD-10-CM | POA: Diagnosis not present

## 2015-03-15 DIAGNOSIS — S59902A Unspecified injury of left elbow, initial encounter: Secondary | ICD-10-CM | POA: Diagnosis not present

## 2015-03-15 DIAGNOSIS — Y92002 Bathroom of unspecified non-institutional (private) residence single-family (private) house as the place of occurrence of the external cause: Secondary | ICD-10-CM | POA: Diagnosis not present

## 2015-03-15 DIAGNOSIS — S51012A Laceration without foreign body of left elbow, initial encounter: Secondary | ICD-10-CM | POA: Insufficient documentation

## 2015-03-15 DIAGNOSIS — W010XXA Fall on same level from slipping, tripping and stumbling without subsequent striking against object, initial encounter: Secondary | ICD-10-CM | POA: Insufficient documentation

## 2015-03-15 DIAGNOSIS — Z7982 Long term (current) use of aspirin: Secondary | ICD-10-CM | POA: Diagnosis not present

## 2015-03-15 DIAGNOSIS — S299XXA Unspecified injury of thorax, initial encounter: Secondary | ICD-10-CM | POA: Diagnosis not present

## 2015-03-15 DIAGNOSIS — S20212A Contusion of left front wall of thorax, initial encounter: Secondary | ICD-10-CM | POA: Diagnosis not present

## 2015-03-15 DIAGNOSIS — T148XXA Other injury of unspecified body region, initial encounter: Secondary | ICD-10-CM

## 2015-03-15 DIAGNOSIS — W19XXXA Unspecified fall, initial encounter: Secondary | ICD-10-CM

## 2015-03-15 DIAGNOSIS — N183 Chronic kidney disease, stage 3 (moderate): Secondary | ICD-10-CM | POA: Insufficient documentation

## 2015-03-15 MED ORDER — BACITRACIN ZINC 500 UNIT/GM EX OINT
TOPICAL_OINTMENT | Freq: Two times a day (BID) | CUTANEOUS | Status: DC
  Administered 2015-03-15: 14:00:00 via TOPICAL
  Filled 2015-03-15: qty 0.9

## 2015-03-15 MED ORDER — ACETAMINOPHEN 325 MG PO TABS
650.0000 mg | ORAL_TABLET | Freq: Once | ORAL | Status: AC
  Administered 2015-03-15: 650 mg via ORAL
  Filled 2015-03-15: qty 2

## 2015-03-15 NOTE — Discharge Instructions (Signed)
Rib Contusion A rib contusion is a deep bruise on your rib area. Contusions are the result of a blunt trauma that causes bleeding and injury to the tissues under the skin. A rib contusion may involve bruising of the ribs and of the skin and muscles in the area. The skin overlying the contusion may turn blue, purple, or yellow. Minor injuries will give you a painless contusion, but more severe contusions may stay painful and swollen for a few weeks. CAUSES  A contusion is usually caused by a blow, trauma, or direct force to an area of the body. This often occurs while playing contact sports. SYMPTOMS  Swelling and redness of the injured area.  Discoloration of the injured area.  Tenderness and soreness of the injured area.  Pain with or without movement. DIAGNOSIS  The diagnosis can be made by taking a medical history and performing a physical exam. An X-ray, CT scan, or MRI may be needed to determine if there were any associated injuries, such as broken bones (fractures) or internal injuries. TREATMENT  Often, the best treatment for a rib contusion is rest. Icing or applying cold compresses to the injured area may help reduce swelling and inflammation. Deep breathing exercises may be recommended to reduce the risk of partial lung collapse and pneumonia. Over-the-counter or prescription medicines may also be recommended for pain control. HOME CARE INSTRUCTIONS   Apply ice to the injured area:  Put ice in a plastic bag.  Place a towel between your skin and the bag.  Leave the ice on for 20 minutes, 2-3 times per day.  Take medicines only as directed by your health care provider.  Rest the injured area. Avoid strenuous activity and any activities or movements that cause pain. Be careful during activities and avoid bumping the injured area.  Perform deep-breathing exercises as directed by your health care provider.  Do not lift anything that is heavier than 5 lb (2.3 kg) until your  health care provider approves.  Do not use any tobacco products, including cigarettes, chewing tobacco, or electronic cigarettes. If you need help quitting, ask your health care provider. SEEK MEDICAL CARE IF:   You have increased bruising or swelling.  You have pain that is not controlled with treatment.  You have a fever. SEEK IMMEDIATE MEDICAL CARE IF:   You have difficulty breathing or shortness of breath.  You develop a continual cough, or you cough up thick or bloody sputum.  You feel sick to your stomach (nauseous), you throw up (vomit), or you have abdominal pain.   This information is not intended to replace advice given to you by your health care provider. Make sure you discuss any questions you have with your health care provider.   Skin Tear Care    A skin tear is a wound in which the top layer of skin has peeled off. This is a common problem with aging because the skin becomes thinner and more fragile as a person gets older. In addition, some medicines, such as oral corticosteroids, can lead to skin thinning if taken for long periods of time.  A skin tear is often repaired with tape or skin adhesive strips. This keeps the skin that has been peeled off in contact with the healthier skin beneath. Depending on the location of the wound, a bandage (dressing) may be applied over the tape or skin adhesive strips. Sometimes, during the healing process, the skin turns black and dies. Even when this happens, the torn  skin acts as a good dressing until the skin underneath gets healthier and repairs itself.  HOME CARE INSTRUCTIONS  Change dressings once per day or as directed by your caregiver.  Gently clean the skin tear and the area around the tear using saline solution or mild soap and water.  Do not rub the injured skin dry. Let the area air dry.  Apply petroleum jelly or an antibiotic cream or ointment to keep the tear moist. This will help the wound heal. Do not allow a scab to  form.  If the dressing sticks before the next dressing change, moisten it with warm soapy water and gently remove it. Protect the injured skin until it has healed.  Only take over-the-counter or prescription medicines as directed by your caregiver.  Take showers or baths using warm soapy water. Apply a new dressing after the shower or bath.  Keep all follow-up appointments as directed by your caregiver.  SEEK IMMEDIATE MEDICAL CARE IF:  You have redness, swelling, or increasing pain in the skin tear.  You have pus coming from the skin tear.  You have chills.  You have a red streak that goes away from the skin tear.  You have a bad smell coming from the tear or dressing.  You have a fever or persistent symptoms for more than 2-3 days.  You have a fever and your symptoms suddenly get worse. MAKE SURE YOU:  Understand these instructions.  Will watch this condition.  Will get help right away if your child is not doing well or gets worse. This information is not intended to replace advice given to you by your health care provider. Make sure you discuss any questions you have with your health care provider.  Document Released: 10/21/2000 Document Revised: 10/21/2011 Document Reviewed: 08/10/2011  Elsevier Interactive Patient Education 2016 [employer]     Document Released: 10/21/2000 Document Revised: 02/16/2014 Document Reviewed: 11/07/2013 Elsevier Interactive Patient Education [employer]

## 2015-03-15 NOTE — ED Notes (Signed)
States she fell this am in the bathroom.. Skin tears noted to left forearm and pain to left lateral rib area. Family at bedside

## 2015-03-15 NOTE — ED Notes (Signed)
Pt arrives to ED via POV with daughter from [place]. Pt fell this AM, states that she tripped and fell in her room around 6AM. Pt states it was unwitnessed fall but that the staff arrived within 2 minutes. Pt denies hitting head, denies LOC. Pt c/o arm pain, skin tears covered with 2 bandages, no bleeding. Pt also has golf ball sized bruising to left rib, painful with touch only. Pt denies use of blood thinners. Pt alert and oriented X4, active, cooperative, pt in NAD. RR even and unlabored, color WNL.

## 2015-03-15 NOTE — ED Provider Notes (Signed)
[facility] Emergency Department Provider Note  ____________________________________________  Time seen: Approximately 1:09 PM  I have reviewed the triage vital signs and the nursing notes.   HISTORY  Chief Complaint Fall    HPI [name] is a 82 y.o. female who presents emergency department status post a mechanical fall. Patient states that she was in the bathroom when she caught her foot causing her to fall on her left side. Patient states that she did not hit her head or lose consciousness at any point. Patient is not taking any blood thinners. She endorses pain and a "knot" to her left ribs. She has "cut" to her left elbow. Patient denies any headache, visual acuity changes, neck pain, shortness of breath, abdominal pain, nausea or vomiting.   Past Medical History  Diagnosis Date  . Hyperlipidemia   . Hypertension   . Hypothyroidism   . GERD (gastroesophageal reflux disease)   . Pneumonia   . Lymphedema   . CKD (chronic kidney disease), stage III     Patient Active Problem List   Diagnosis Date Noted  . Benign hypertension with CKD (chronic kidney disease) stage III 10/16/2014  . Stage III chronic kidney disease 10/16/2014  . Dehydration 10/16/2014  . Sepsis (HCC) 09/22/2014  . Hypoxia 09/22/2014  . Healthcare-associated pneumonia 09/22/2014  . ARF (acute renal failure) (HCC) 09/22/2014  . Lactic acid acidosis 09/22/2014  . Frequent falls 09/06/2013  . Ankle edema 09/30/2012  . CAD, ARTERY BYPASS GRAFT 06/21/2009  . HYPOTHYROIDISM 07/17/2008  . Hyperlipidemia 07/17/2008  . HTN (hypertension) 07/17/2008  . GERD 07/17/2008  . DYSPNEA 07/17/2008  . CHEST PAIN-UNSPECIFIED 07/17/2008    Past Surgical History  Procedure Laterality Date  . Cholecystectomy    . Abdominal hysterectomy    . Coronary artery bypass graft      Current Outpatient Rx  Name  Route  Sig  Dispense  Refill  . acetaminophen (TYLENOL) 500 MG tablet   Oral  Take 500 mg by mouth every 4 (four) hours as needed for mild pain or fever.         . ALPRAZolam (XANAX) 1 MG tablet   Oral   Take 1 mg by mouth at bedtime. For anxiety         . amLODipine (NORVASC) 2.5 MG tablet   Oral   Take 2.5 mg by mouth every morning.          Marland Kitchen aspirin 81 MG EC tablet   Oral   Take 81 mg by mouth daily.           Marland Kitchen docusate sodium (COLACE) 100 MG capsule   Oral   Take 100 mg by mouth daily. At 4 pm         . gabapentin (NEURONTIN) 100 MG capsule   Oral   Take 100 mg by mouth 3 (three) times daily.          Marland Kitchen guaifenesin (ROBITUSSIN) 100 MG/5ML syrup   Oral   Take 200 mg by mouth every 6 (six) hours as needed for cough.         Marland Kitchen HYDROcodone-acetaminophen (NORCO) 10-325 MG per tablet   Oral   Take 1 tablet by mouth every 4 (four) hours as needed for moderate pain.         Marland Kitchen levothyroxine (SYNTHROID, LEVOTHROID) 100 MCG tablet   Oral   Take 100 mcg by mouth daily.         Marland Kitchen loperamide (IMODIUM) 2 MG  capsule   Oral   Take 2 mg by mouth as needed for diarrhea or loose stools.         . metoprolol succinate (TOPROL-XL) 50 MG 24 hr tablet   Oral   Take 50 mg by mouth daily.         . mirtazapine (REMERON) 7.5 MG tablet   Oral   Take 7.5 mg by mouth at bedtime.         Marland Kitchen neomycin-bacitracin-polymyxin (NEOSPORIN) [phone] ointment   Topical   Apply 1 application topically as needed (skin tears, abrasions, or minor irritations).         Marland Kitchen omeprazole (PRILOSEC) 20 MG capsule   Oral   Take 20 mg by mouth daily.         . pantoprazole (PROTONIX) 40 MG tablet   Oral   Take 40 mg by mouth daily.         . potassium chloride (KLOR-CON) 20 MEQ packet   Oral   Take 20 mEq by mouth 2 (two) times daily.   60 tablet   1   . simvastatin (ZOCOR) 40 MG tablet   Oral   Take 40 mg by mouth at bedtime.           . sucralfate (CARAFATE) 1 G tablet   Oral   Take 1 g by mouth daily.          Marland Kitchen torsemide (DEMADEX)  10 MG tablet   Oral   Take 10 mg by mouth daily.         . vitamin B-12 (CYANOCOBALAMIN) 1000 MCG tablet   Oral   Take 1,000 mcg by mouth every morning.            Allergies Iodine and Tetanus toxoids  Family History  Problem Relation Age of Onset  . Family history unknown: Yes    Social History Social History  Substance Use Topics  . Smoking status: Never Smoker   . Smokeless tobacco: Never Used  . Alcohol Use: No     Review of Systems  Constitutional: No fever/chills Eyes: No visual changes.  Cardiovascular: no chest pain. Respiratory: no cough. No SOB. Gastrointestinal: No abdominal pain.  No nausea, no vomiting.   Musculoskeletal: Negative for back pain. Positive for left elbow and left rib pain. Skin: Negative for rash. Neurological: Negative for headaches, focal weakness or numbness. 10-point ROS otherwise negative.  ____________________________________________   PHYSICAL EXAM:  VITAL SIGNS: ED Triage Vitals  Enc Vitals Group     BP 03/15/15 1115 110/74 mmHg     Pulse Rate 03/15/15 1115 63     Resp 03/15/15 1115 18     Temp 03/15/15 1115 97.7 F (36.5 C)     Temp Source 03/15/15 1115 Oral     SpO2 03/15/15 1115 99 %     Weight 03/15/15 1115 126 lb (57.153 kg)     Height 03/15/15 1115  (1.6 m)     Head Cir --      Peak Flow --      Pain Score 03/15/15 1115 8     Pain Loc --      Pain Edu? --      Excl. in GC? --      Constitutional: Alert and oriented. Well appearing and in no acute distress. Eyes: Conjunctivae are normal. PERRL. EOMI. Head: Atraumatic. Neck: No stridor.  No cervical spine tenderness to palpation. Hematological/Lymphatic/Immunilogical: No cervical lymphadenopathy. Cardiovascular: Normal rate, regular rhythm. Normal  S1 and S2.  Good peripheral circulation. Respiratory: Normal respiratory effort without tachypnea or retractions. Lungs CTAB. No decreased or absent breath sounds. Gastrointestinal: Soft and nontender. No  rigidity. No guarding. No distention.  Musculoskeletal: No lower extremity tenderness nor edema.  No joint effusions. No visible deformity to rib cage upon inspection. Ecchymosis noted to the left lateral rib cage. Patient is tender to palpation in the left lateral ribs. No palpable abnormality. No flail segments noted. No paradoxical chest wall movement. No visible in the midline to left elbow when compared to the right. Full range of motion of elbow. No tenderness to palpation. Radial pulse appreciated distally. Sensation intact 5 digits and equal to the unaffected extremity. Neurologic:  Normal speech and language. No gross focal neurologic deficits are appreciated.  Skin:  Skin is warm, dry and intact. No rash noted. 2 skin tears noted to the left posterior elbow. Psychiatric: Mood and affect are normal. Speech and behavior are normal. Patient exhibits appropriate insight and judgement.   ____________________________________________   LABS (all labs ordered are listed, but only abnormal results are displayed)  Labs Reviewed - No data to display ____________________________________________  EKG   ____________________________________________  RADIOLOGY Festus Barren Tiffney Haughton, personally viewed and evaluated these images (plain radiographs) as part of my medical decision making, as well as reviewing the written report by the radiologist.  Dg Chest 2 View  03/15/2015  CLINICAL DATA:  Fall. EXAM: CHEST  2 VIEW COMPARISON:  11/04/2014 FINDINGS: Previous median sternotomy and CABG procedure. Aortic atherosclerosis is noted. There is a calcified granuloma in the left lower lobe. No pleural effusion or edema identified. IMPRESSION: 1. No acute cardiopulmonary abnormalities. Electronically Signed   By: Signa Kell M.D.   On: 03/15/2015 11:57   Dg Elbow Complete Left  03/15/2015  CLINICAL DATA:  Fall EXAM: LEFT ELBOW - COMPLETE 3+ VIEW COMPARISON:  None FINDINGS: There is no evidence of  fracture, dislocation, or joint effusion. There is no evidence of arthropathy or other focal bone abnormality. Soft tissue laceration is identified overlying the medial epicondyle. IMPRESSION: 1. Soft tissue laceration. 2. No acute bone abnormality. Electronically Signed   By: Signa Kell M.D.   On: 03/15/2015 11:54    ____________________________________________    PROCEDURES  Procedure(s) performed:       Medications  acetaminophen (TYLENOL) tablet 650 mg (not administered)  bacitracin ointment (not administered)     ____________________________________________   INITIAL IMPRESSION / ASSESSMENT AND PLAN / ED COURSE  Pertinent labs & imaging results that were available during my care of the patient were reviewed by me and considered in my medical decision making (see chart for details).  Patient's diagnosis is consistent with a fall causing left rib contusion and left elbow skin tears. Patient's arm is unsuturable. He'll be covered with bacitracin and bandaging. Patient may take Tylenol at home for some intermittent relief. Patient is to follow up with primary care provider if symptoms persist past this treatment course. Patient is given ED precautions to return to the ED for any worsening or new symptoms.     ____________________________________________  FINAL CLINICAL IMPRESSION(S) / ED DIAGNOSES  Final diagnoses:  Fall, initial encounter  Multiple skin tears  Rib contusion, left, initial encounter      NEW MEDICATIONS STARTED DURING THIS VISIT:  New Prescriptions   No medications on file        Racheal Patches, PA-C 03/15/15 1338  Jeanmarie Plant, MD 03/15/15 1527

## 2015-03-15 NOTE — ED Notes (Signed)
Spoke with PA about pt being seen in POD D. PA approved.

## 2015-03-27 DIAGNOSIS — R300 Dysuria: Secondary | ICD-10-CM | POA: Diagnosis not present

## 2015-04-04 ENCOUNTER — Ambulatory Visit (INDEPENDENT_AMBULATORY_CARE_PROVIDER_SITE_OTHER): Payer: Commercial Managed Care - HMO | Admitting: Urology

## 2015-04-04 ENCOUNTER — Encounter: Payer: Self-pay | Admitting: Urology

## 2015-04-04 VITALS — BP 130/57 | HR 64 | Ht 64.0 in | Wt 132.9 lb

## 2015-04-04 DIAGNOSIS — R32 Unspecified urinary incontinence: Secondary | ICD-10-CM | POA: Diagnosis not present

## 2015-04-04 DIAGNOSIS — N952 Postmenopausal atrophic vaginitis: Secondary | ICD-10-CM | POA: Diagnosis not present

## 2015-04-04 DIAGNOSIS — N390 Urinary tract infection, site not specified: Secondary | ICD-10-CM | POA: Insufficient documentation

## 2015-04-04 DIAGNOSIS — R350 Frequency of micturition: Secondary | ICD-10-CM

## 2015-04-04 LAB — BLADDER SCAN AMB NON-IMAGING: Scan Result: 12

## 2015-04-04 MED ORDER — ESTRADIOL 0.1 MG/GM VA CREA: TOPICAL_CREAM | VAGINAL | Status: DC

## 2015-04-04 NOTE — Patient Instructions (Signed)
Patient was given a sample of vaginal estrogen cream and instructed to apply 0.5mg (pea-sized amount)  just inside the vaginal introitus with a finger-tip every night for two weeks and then Monday, Wednesday and Friday nights.  I explained to the patient that vaginally administered estrogen, which causes only a slight increase in the blood estrogen levels, have fewer contraindications and adverse systemic effects that oral HT. 

## 2015-04-04 NOTE — Progress Notes (Signed)
04/04/2015 11:11 AM   [name] Nov 03, 1922 [id number]  Referring provider: Marguarite Arbour, MD [address] [facility] [place], [place] [address]  Chief Complaint  Patient presents with  . Recurrent UTI    referred by Dr. [name]    HPI: Patient is a 82 year old Caucasian female who is referred to [place] by her PCP, Dr. [name], for recurrent UTI's.    Patient's daughter presents today with the patient, as she is the patient's P.O.A..  The patient is also a poor historian, so her daughter is able to provide the history.    Patient's daughter states that her mother has mental status changes when she has an UTI.  She has had a documented UTI in 08/21/2014 for Kleb and a documented UTI for E. Coli.  Her daughter states that she has been having difficulty with recurrent UTI's for the last two years.    She has had the increase of urinary frequency and urgency over the last few months.  Her PCP prescribed Vesicare 10 mg for the incontinence and frequency.  Patient states that   Her baseline urinary symptoms consist of SUI for which she wears depends, nocturia x 1 and hesitancy.    She is not experiencing any dysuria, gross hematuria and suprapubic pain.  She denies any recent fevers, chills, nausea or vomiting.    She is currently on Macrobid for an UTI.  Her UA today is unremarkable.  Her PVR is 12 mL.    PMH: Past Medical History  Diagnosis Date  . Hyperlipidemia   . Hypertension   . Hypothyroidism   . GERD (gastroesophageal reflux disease)   . Pneumonia   . Lymphedema   . CKD (chronic kidney disease), stage III   . Anemia   . Chronic kidney disease     Stage 3  . OA (osteoarthritis)   . Heart disease   . History of stomach ulcers   . Recurrent UTI     Surgical History: Past Surgical History  Procedure Laterality Date  . Cholecystectomy    . Abdominal hysterectomy    . Coronary artery bypass graft      Home Medications:    Medication List       This list is accurate as of: 04/04/15 11:11 AM.  Always use your most recent med list.               acetaminophen 500 MG tablet  Commonly known as:  TYLENOL  Take 500 mg by mouth every 4 (four) hours as needed for mild pain or fever.     ALPRAZolam 1 MG tablet  Commonly known as:  XANAX  Take 1 mg by mouth at bedtime. For anxiety     amLODipine 2.5 MG tablet  Commonly known as:  NORVASC  Take 2.5 mg by mouth every morning.     aspirin 81 MG EC tablet  Take 81 mg by mouth daily.     cefUROXime 250 MG tablet  Commonly known as:  CEFTIN  Take by mouth. Reported on 04/04/2015     colchicine 0.6 MG tablet     docusate sodium 100 MG capsule  Commonly known as:  COLACE  Take 100 mg by mouth daily. At 4 pm     estradiol 0.1 MG/GM vaginal cream  Commonly known as:  ESTRACE VAGINAL  Apply 0.5mg  (pea-sized amount)  just inside the vaginal introitus with a finger-tip every night for two weeks and then Monday,  Wednesday and Friday nights.     gabapentin 100 MG capsule  Commonly known as:  NEURONTIN  Take 100 mg by mouth 3 (three) times daily.     guaifenesin 100 MG/5ML syrup  Commonly known as:  ROBITUSSIN  Take 200 mg by mouth every 6 (six) hours as needed for cough.     HYDROcodone-acetaminophen 10-325 MG tablet  Commonly known as:  NORCO  Take 1 tablet by mouth every 4 (four) hours as needed for moderate pain.     KLOR-CON M20 20 MEQ tablet  Generic drug:  potassium chloride SA  Reported on 04/04/2015     levothyroxine 100 MCG tablet  Commonly known as:  SYNTHROID, LEVOTHROID  Take 100 mcg by mouth daily.     loperamide 2 MG capsule  Commonly known as:  IMODIUM  Take 2 mg by mouth as needed for diarrhea or loose stools.     metoprolol succinate 50 MG 24 hr tablet  Commonly known as:  TOPROL-XL  Take 50 mg by mouth daily.     mirtazapine 7.5 MG tablet  Commonly known as:  REMERON  Take 7.5 mg by mouth at bedtime.     neomycin-bacitracin-polymyxin [phone]  ointment  Apply 1 application topically as needed (skin tears, abrasions, or minor irritations). Reported on 04/04/2015     nitrofurantoin (macrocrystal-monohydrate) 100 MG capsule  Commonly known as:  MACROBID     omeprazole 20 MG capsule  Commonly known as:  PRILOSEC  Take 20 mg by mouth daily.     oxybutynin 5 MG tablet  Commonly known as:  DITROPAN  Reported on 04/04/2015     pantoprazole 40 MG tablet  Commonly known as:  PROTONIX  Take 40 mg by mouth daily.     potassium chloride 20 MEQ packet  Commonly known as:  KLOR-CON  Take 20 mEq by mouth 2 (two) times daily.     simvastatin 40 MG tablet  Commonly known as:  ZOCOR  Take 40 mg by mouth at bedtime.     solifenacin 10 MG tablet  Commonly known as:  VESICARE  Take by mouth.     sucralfate 1 g tablet  Commonly known as:  CARAFATE  Take 1 g by mouth daily.     torsemide 10 MG tablet  Commonly known as:  DEMADEX  Take 10 mg by mouth daily.     vitamin B-12 1000 MCG tablet  Commonly known as:  CYANOCOBALAMIN  Take 1,000 mcg by mouth every morning.        Allergies:  Allergies  Allergen Reactions  . Iodine     Rash/hives  . Tetanus Toxoids Other (See Comments)    From nursing home [facility]...side effects unknown    Family History: Family History  Problem Relation Age of Onset  . Kidney disease Neg Hx   . Bladder Cancer Neg Hx     Social History:  reports that she has never smoked. She has never used smokeless tobacco. She reports that she does not drink alcohol or use illicit drugs.  ROS: UROLOGY Frequent Urination?: Yes Hard to postpone urination?: Yes Burning/pain with urination?: No Get up at night to urinate?: Yes Leakage of urine?: Yes Urine stream starts and stops?: No Trouble starting stream?: Yes Do you have to strain to urinate?: No Blood in urine?: No Urinary tract infection?: Yes Sexually transmitted disease?: No Injury to kidneys or bladder?: No Painful intercourse?: No Weak  stream?: No Currently pregnant?: No Vaginal bleeding?: No Last menstrual  period?: n  Gastrointestinal Nausea?: No Vomiting?: No Indigestion/heartburn?: Yes Diarrhea?: No Constipation?: No  Constitutional Fever: No Night sweats?: No Weight loss?: No Fatigue?: Yes  Skin Skin rash/lesions?: No Itching?: No  Eyes Blurred vision?: No Double vision?: No  Ears/Nose/Throat Sore throat?: No Sinus problems?: No  Hematologic/Lymphatic Swollen glands?: No Easy bruising?: Yes  Cardiovascular Leg swelling?: No Chest pain?: No  Respiratory Cough?: No Shortness of breath?: No  Endocrine Excessive thirst?: No  Musculoskeletal Back pain?: Yes Joint pain?: No  Neurological Headaches?: No Dizziness?: No  Psychologic Depression?: No Anxiety?: Yes  Physical Exam: BP 130/57 mmHg  Pulse 64  Ht 5\' 4"  (1.626 m)  Wt 132 lb 14.4 oz (60.283 kg)  BMI 22.80 kg/m2  Constitutional: Well nourished. Alert and oriented, No acute distress. HEENT: [place] AT, moist mucus membranes. Trachea midline, no masses. Cardiovascular: No clubbing, cyanosis, or edema. Respiratory: Normal respiratory effort, no increased work of breathing. GI: Abdomen is soft, non tender, non distended, no abdominal masses. Liver and spleen not palpable.  No hernias appreciated.  Stool sample for occult testing is not indicated.   GU: No CVA tenderness.  No bladder fullness or masses.  Atrophic external genitalia,  normal pubic hair distribution, no lesions.  Normal urethral meatus, no lesions, no prolapse, no discharge.   Urethral caruncle is noted. No bladder fullness, tenderness or masses. Normal vagina mucosa, poor estrogen effect, no discharge, no lesions, good pelvic support, Grade I cystocele.  No rectocele noted.  Cervix and uterus are surgically absent.  No pelvic masses are palpated.   Anus and perineum are without rashes or lesions.    Skin: No rashes, bruises or suspicious lesions. Lymph: No cervical or  inguinal adenopathy. Neurologic: Grossly intact, no focal deficits, moving all 4 extremities. Psychiatric: Normal mood and affect.  Laboratory Data: Lab Results  Component Value Date   WBC 5.9 11/04/2014   HGB 10.6* 11/04/2014   HCT 31.6* 11/04/2014   MCV 89.9 11/04/2014   PLT 235 11/04/2014   Lab Results  Component Value Date   CREATININE 1.28* 11/04/2014   Lab Results  Component Value Date   TSH 1.50 02/22/2014      Component Value Date/Time   CHOL 144 12/30/2012 0521   CHOL 194 04/22/2010 2115   HDL 58 12/30/2012 0521   HDL 87 04/22/2010 2115   CHOLHDL 2.2 Ratio 04/22/2010 2115   VLDL 26 12/30/2012 0521   VLDL 25 04/22/2010 2115   LDLCALC 60 12/30/2012 0521   LDLCALC 82 04/22/2010 2115    Lab Results  Component Value Date   AST 27 11/04/2014   Lab Results  Component Value Date   ALT 16 11/04/2014     Urinalysis Results for orders placed or performed in visit on 04/04/15  Microscopic Examination  Result Value Ref Range   WBC, UA 0-5 0 -  5 /hpf   RBC, UA None seen 0 -  2 /hpf   Epithelial Cells (non renal) 0-10 0 - 10 /hpf   Bacteria, UA None seen None seen/Few  Urinalysis, Complete  Result Value Ref Range   Specific Gravity, UA 1.010 1.005 - 1.030   pH, UA 5.5 5.0 - 7.5   Color, UA Yellow Yellow   Appearance Ur Clear Clear   Leukocytes, UA Negative Negative   Protein, UA Negative Negative/Trace   Glucose, UA Negative Negative   Ketones, UA Negative Negative   RBC, UA Negative Negative   Bilirubin, UA Negative Negative   Urobilinogen, Ur 0.2  0.2 - 1.0 mg/dL   Nitrite, UA Negative Negative   Microscopic Examination See below:   BLADDER SCAN AMB NON-IMAGING  Result Value Ref Range   Scan Result 12     Pertinent Imaging: Results for SARAHLYNN, CISNERO (MRN [id number]) as of 04/04/2015 10:52  Ref. Range 04/04/2015 10:44  Scan Result Unknown 12    Assessment & Plan:    1. Recurrent UTI:   Patient has had two documented UTI's over the last year.   She is currently on Macrobid.  Her UA is unremarkable.  Her PVR is minimal, so the cystocele is not likely contributing to her recurrent UTI's.  She was found to have atrophic vaginitis and will be initiated on vaginal estrogen cream.    - Urinalysis, Complete - BLADDER SCAN AMB NON-IMAGING  2. Atrophic vaginitis:   Patient was given a sample of Estrace vaginal estrogen cream and instructed to apply 0.5mg  (pea-sized amount)  just inside the vaginal introitus with a finger-tip every night for two weeks and then Monday, Wednesday and Friday nights.  I explained to the patient that vaginally administered estrogen, which causes only a slight increase in the blood estrogen levels, have fewer contraindications and adverse systemic effects that oral HT.  3. Incontinence:  Patient was found to have a cystocele on today's exam.  I explained to the patient that some of her incontinence was caused by the cystocele.  I described the natural history of SUI and that the Vesicare would not help with that type of incontinence.  She denies any urge incontinence, but she is experiencing urinary frequency.    4. Frequency:   Patient has had some cognitive dysfunction and hs not found the Vesicare very effective with helping with her urinary symptoms. I will try her on Myrbetriq in place of  Vesicare to see if we can get better control of the urinary frequency and less cognitive dysfunction.    Return in about 1 month (around 05/02/2015) for exam and PVR.  These notes generated with voice recognition software. I apologize for typographical errors.  Michiel Cowboy, PA-C  [facility] [address] [place], [place] [address] [phone]

## 2015-04-05 LAB — URINALYSIS, COMPLETE
Bilirubin, UA: NEGATIVE
Glucose, UA: NEGATIVE
KETONES UA: NEGATIVE
LEUKOCYTES UA: NEGATIVE
Nitrite, UA: NEGATIVE
Protein, UA: NEGATIVE
RBC, UA: NEGATIVE
SPEC GRAV UA: 1.01 (ref 1.005–1.030)
Urobilinogen, Ur: 0.2 mg/dL (ref 0.2–1.0)
pH, UA: 5.5 (ref 5.0–7.5)

## 2015-04-05 LAB — MICROSCOPIC EXAMINATION
BACTERIA UA: NONE SEEN
RBC, UA: NONE SEEN /hpf (ref 0–?)

## 2015-04-15 ENCOUNTER — Telehealth: Payer: Self-pay

## 2015-04-15 NOTE — Telephone Encounter (Signed)
Pt daughter called in stating pt took her last macrobid Saturday and she is currently have dysuria. Daughter stated pt urinated on a home UTI test and it was positive. Pt assisted living is not able to cath. Therefore daughter made a f/u appt with Lillia AbedLindsay for this week.

## 2015-04-16 ENCOUNTER — Ambulatory Visit (INDEPENDENT_AMBULATORY_CARE_PROVIDER_SITE_OTHER): Payer: Commercial Managed Care - HMO | Admitting: Obstetrics and Gynecology

## 2015-04-16 ENCOUNTER — Encounter: Payer: Self-pay | Admitting: Obstetrics and Gynecology

## 2015-04-16 VITALS — BP 138/70 | HR 68 | Temp 97.4°F | Resp 16 | Ht 62.5 in

## 2015-04-16 DIAGNOSIS — R300 Dysuria: Secondary | ICD-10-CM | POA: Diagnosis not present

## 2015-04-16 LAB — MICROSCOPIC EXAMINATION: Epithelial Cells (non renal): NONE SEEN /hpf (ref 0–10)

## 2015-04-16 LAB — URINALYSIS, COMPLETE
Bilirubin, UA: NEGATIVE
Glucose, UA: NEGATIVE
Ketones, UA: NEGATIVE
Nitrite, UA: NEGATIVE
PH UA: 5.5 (ref 5.0–7.5)
SPEC GRAV UA: 1.01 (ref 1.005–1.030)
Urobilinogen, Ur: 0.2 mg/dL (ref 0.2–1.0)

## 2015-04-16 MED ORDER — NITROFURANTOIN MONOHYD MACRO 100 MG PO CAPS: 100.0000 mg | ORAL_CAPSULE | Freq: Two times a day (BID) | ORAL | Status: DC

## 2015-04-16 NOTE — Progress Notes (Signed)
In and Out Catheterization  Patient is present today for a I & O catheterization due to collecting a clean catch urine sample. Patient was cleaned and prepped in a sterile fashion with betadine and Lidocaine 2% jelly was instilled into the urethra.  A 14FR cath was inserted no complications were noted , 200ml of urine return was noted, urine was slightly cloudy and yellow in color. A clean urine sample was collected for clean catch urinalysis. Bladder was drained and catheter was removed with out difficulty.    Preformed by: Dallas Schimkeamona Williams CMA

## 2015-04-16 NOTE — Progress Notes (Signed)
11:19 AM   [name] [birth year] [id number]  Referring provider: Idelle Crouch, MD [facility] [place], [place] [address]  Chief Complaint  Patient presents with  . Dysuria    HPI: Patient is a 82 year old Caucasian female who is referred to [place] by her PCP, Dr. Doy Hutching, for recurrent UTI's.    Patient's daughter presents today with the patient, as she is the patient's P.O.A..  The patient is also a poor historian, so her daughter is able to provide the history.    Patient's daughter states that her mother has mental status changes when she has an UTI.  She has had a documented UTI in 08/21/2014 for Kleb and a documented UTI for E. Coli.  Her daughter states that she has been having difficulty with recurrent UTI's for the last two years.    She has had the increase of urinary frequency and urgency over the last few months.  Her PCP prescribed Vesicare 10 mg for the incontinence and frequency.  Patient states that   Her baseline urinary symptoms consist of SUI for which she wears depends, nocturia x 1 and hesitancy.    She is not experiencing any dysuria, gross hematuria and suprapubic pain.  She denies any recent fevers, chills, nausea or vomiting.    She is currently on Macrobid for an UTI.  Her UA today is unremarkable.  Her PVR is 12 mL.   Interval History 04/16/15 She presents today with her daughter with complaints of suprapubic burning over the last few days. She is not experiencing flank pain or fevers. No recent cognitive changes. Her daughter states that they used in at home UTI test kit which was positive for infection. She recently completed her Macrobid for an Escherichia coli positive urine culture. She has been using her estrogen cream as prescribed and taking my Saralyn Pilar though she has not noticed a significant difference in her urinary symptoms as of yet.  PMH: Past Medical History  Diagnosis Date  . Hyperlipidemia   . Hypertension    . Hypothyroidism   . GERD (gastroesophageal reflux disease)   . Pneumonia   . Lymphedema   . CKD (chronic kidney disease), stage III   . Anemia   . Chronic kidney disease     Stage 3  . OA (osteoarthritis)   . Heart disease   . History of stomach ulcers   . Recurrent UTI     Surgical History: Past Surgical History  Procedure Laterality Date  . Cholecystectomy    . Abdominal hysterectomy    . Coronary artery bypass graft      Home Medications:    Medication List       This list is accurate as of: 04/16/15 11:19 AM.  Always use your most recent med list.               ALPRAZolam 1 MG tablet  Commonly known as:  XANAX  Take 1 mg by mouth at bedtime. For anxiety     amLODipine 2.5 MG tablet  Commonly known as:  NORVASC  Take 2.5 mg by mouth every morning.     aspirin 81 MG EC tablet  Take 81 mg by mouth daily.     docusate sodium 100 MG capsule  Commonly known as:  COLACE  Take 100 mg by mouth daily. At 4 pm     estradiol 0.1 MG/GM vaginal cream  Commonly known as:  ESTRACE VAGINAL  Apply 0.60m (pea-sized amount)  just inside the vaginal introitus with a finger-tip every night for two weeks and then Monday, Wednesday and Friday nights.     gabapentin 100 MG capsule  Commonly known as:  NEURONTIN  Take 100 mg by mouth 3 (three) times daily.     HYDROcodone-acetaminophen 10-325 MG tablet  Commonly known as:  NORCO  Take 1 tablet by mouth every 4 (four) hours as needed for moderate pain.     KLOR-CON M20 20 MEQ tablet  Generic drug:  potassium chloride SA  Reported on 04/04/2015     levothyroxine 100 MCG tablet  Commonly known as:  SYNTHROID, LEVOTHROID  Take 100 mcg by mouth daily.     loperamide 2 MG capsule  Commonly known as:  IMODIUM  Take 2 mg by mouth as needed for diarrhea or loose stools.     metoprolol succinate 50 MG 24 hr tablet  Commonly known as:  TOPROL-XL  Take 50 mg by mouth daily.     mirtazapine 7.5 MG tablet  Commonly known as:   REMERON  Take 7.5 mg by mouth at bedtime.     MYRBETRIQ 25 MG Tb24 tablet  Generic drug:  mirabegron ER  Take 25 mg by mouth daily.     neomycin-bacitracin-polymyxin [phone] ointment  Apply 1 application topically as needed (skin tears, abrasions, or minor irritations). Reported on 04/04/2015     nitrofurantoin (macrocrystal-monohydrate) 100 MG capsule  Commonly known as:  MACROBID  Take 1 capsule (100 mg total) by mouth every 12 (twelve) hours.     omeprazole 20 MG capsule  Commonly known as:  PRILOSEC  Take 20 mg by mouth daily. Reported on 04/16/2015     pantoprazole 40 MG tablet  Commonly known as:  PROTONIX  Take 40 mg by mouth daily.     potassium chloride 20 MEQ packet  Commonly known as:  KLOR-CON  Take 20 mEq by mouth 2 (two) times daily.     simvastatin 40 MG tablet  Commonly known as:  ZOCOR  Take 40 mg by mouth at bedtime.     solifenacin 10 MG tablet  Commonly known as:  VESICARE  Take by mouth.     sucralfate 1 g tablet  Commonly known as:  CARAFATE  Take 1 g by mouth daily.     torsemide 10 MG tablet  Commonly known as:  DEMADEX  Take 10 mg by mouth daily.     vitamin B-12 1000 MCG tablet  Commonly known as:  CYANOCOBALAMIN  Take 1,000 mcg by mouth every morning.        Allergies:  Allergies  Allergen Reactions  . Iodine     Rash/hives  . Tetanus Toxoids Other (See Comments)    From nursing home MAR...side effects unknown    Family History: Family History  Problem Relation Age of Onset  . Kidney disease Neg Hx   . Bladder Cancer Neg Hx     Social History:  reports that she has never smoked. She has never used smokeless tobacco. She reports that she does not drink alcohol or use illicit drugs.  ROS: UROLOGY Frequent Urination?: Yes Hard to postpone urination?: No Burning/pain with urination?: Yes Get up at night to urinate?: No Leakage of urine?: Yes Urine stream starts and stops?: Yes Trouble starting stream?: No Do you have  to strain to urinate?: No Blood in urine?: No Urinary tract infection?: Yes Sexually transmitted disease?: No Injury to kidneys or bladder?: No Painful  intercourse?: No Weak stream?: No Currently pregnant?: No Vaginal bleeding?: No Last menstrual period?: n  Gastrointestinal Nausea?: No Vomiting?: No Indigestion/heartburn?: Yes Diarrhea?: No Constipation?: No  Constitutional Fever: No Night sweats?: No Weight loss?: No Fatigue?: No  Skin Skin rash/lesions?: No Itching?: No  Eyes Blurred vision?: No Double vision?: No  Ears/Nose/Throat Sore throat?: No Sinus problems?: No  Hematologic/Lymphatic Swollen glands?: No Easy bruising?: No  Cardiovascular Leg swelling?: No Chest pain?: No  Respiratory Cough?: No Shortness of breath?: No  Endocrine Excessive thirst?: No  Musculoskeletal Back pain?: No Joint pain?: No  Neurological Headaches?: No Dizziness?: No  Psychologic Depression?: No Anxiety?: Yes  Physical Exam: BP 138/70 mmHg  Pulse 68  Temp(Src) 97.4 F (36.3 C)  Resp 16  Ht 5' 2.5" (1.588 m)  Constitutional: Well nourished. Alert and oriented, No acute distress. HEENT:  AT, moist mucus membranes. Trachea midline, no masses. Cardiovascular: No clubbing, cyanosis, or edema. Respiratory: Normal respiratory effort, no increased work of breathing. GI: Abdomen is soft, non tender, non distended, no abdominal masses. Liver and spleen not palpable.  No hernias appreciated.  Stool sample for occult testing is not indicated.   GU: No CVA tenderness.   Neurologic: Grossly intact, no focal deficits, moving all 4 extremities. Psychiatric: Normal mood and affect.  Laboratory Data: Lab Results  Component Value Date   WBC 5.9 11/04/2014   HGB 10.6* 11/04/2014   HCT 31.6* 11/04/2014   MCV 89.9 11/04/2014   PLT 235 11/04/2014   Lab Results  Component Value Date   CREATININE 1.28* 11/04/2014   Lab Results  Component Value Date   TSH 1.50  02/22/2014      Component Value Date/Time   CHOL 144 12/30/2012 0521   CHOL 194 04/22/2010 2115   HDL 58 12/30/2012 0521   HDL 87 04/22/2010 2115   CHOLHDL 2.2 Ratio 04/22/2010 2115   VLDL 26 12/30/2012 0521   VLDL 25 04/22/2010 2115   LDLCALC 60 12/30/2012 0521   LDLCALC 82 04/22/2010 2115    Lab Results  Component Value Date   AST 27 11/04/2014   Lab Results  Component Value Date   ALT 16 11/04/2014     Urinalysis Results for orders placed or performed in visit on 04/04/15  Microscopic Examination  Result Value Ref Range   WBC, UA 0-5 0 -  5 /hpf   RBC, UA None seen 0 -  2 /hpf   Epithelial Cells (non renal) 0-10 0 - 10 /hpf   Bacteria, UA None seen None seen/Few  Urinalysis, Complete  Result Value Ref Range   Specific Gravity, UA 1.010 1.005 - 1.030   pH, UA 5.5 5.0 - 7.5   Color, UA Yellow Yellow   Appearance Ur Clear Clear   Leukocytes, UA Negative Negative   Protein, UA Negative Negative/Trace   Glucose, UA Negative Negative   Ketones, UA Negative Negative   RBC, UA Negative Negative   Bilirubin, UA Negative Negative   Urobilinogen, Ur 0.2 0.2 - 1.0 mg/dL   Nitrite, UA Negative Negative   Microscopic Examination See below:   BLADDER SCAN AMB NON-IMAGING  Result Value Ref Range   Scan Result 12     Pertinent Imaging: Results for DANN, VENTRESS (MRN [id number]) as of 04/04/2015 10:52  Ref. Range 04/04/2015 10:44  Scan Result Unknown 12    Assessment & Plan:    1. Recurrent UTI:   Patient has had two documented UTI's over the last year.  She recently completed  Macrobid but began experiencing suprapubic burning a few days ago.  Her UA is unremarkable.  Her PVR is minimal, so the cystocele is not likely contributing to her recurrent UTI's.  She was found to have atrophic vaginitis and will be initiated on vaginal estrogen cream.   - Macrobid prescribed pending culture results. Continue Myrbetriq, start cranberry tabs twice daily, use wet wipes on  perineum after voiding and coconut oil as vaginal moisturizer. -Urine Culture- patient's daughter will call to inquire about results on Friday. Will  need to prescribe additional Macrobid if susceptible. - Urinalysis, Complete - BLADDER SCAN AMB NON-IMAGING   Return for as scheduled.  These notes generated with voice recognition software. I apologize for typographical errors.  Herbert Moors, [facility] [address] [place], [place] [address] [phone]

## 2015-04-19 ENCOUNTER — Telehealth: Payer: Self-pay

## 2015-04-19 DIAGNOSIS — N390 Urinary tract infection, site not specified: Secondary | ICD-10-CM

## 2015-04-19 LAB — CULTURE, URINE COMPREHENSIVE

## 2015-04-19 MED ORDER — CIPROFLOXACIN HCL 250 MG PO TABS: 250.0000 mg | ORAL_TABLET | Freq: Two times a day (BID) | ORAL | Status: AC

## 2015-04-19 NOTE — Telephone Encounter (Signed)
Spoke with daughter and made aware of +ucx. Made aware to stop macrobid and start cipro. Daughter voiced understanding. [facility] Orders faxed.

## 2015-04-19 NOTE — Telephone Encounter (Signed)
-----   Message from Fernanda DrumLindsay C Overton, FNP sent at 04/19/2015  7:56 AM EST ----- Please notify patient and her daughter that her urine culture was positive for infection. It is not susceptible to the Macrobid I prescribed her. Cipro 250 mg twice daily 5 days needs to be sent to patient's pharmacy and she needs to start this as soon as possible. She needs to have a [facility]ck and catheterized specimen after she has completed her antibiotics. Thanks

## 2015-05-02 ENCOUNTER — Encounter: Payer: Self-pay | Admitting: Urology

## 2015-05-02 ENCOUNTER — Ambulatory Visit (INDEPENDENT_AMBULATORY_CARE_PROVIDER_SITE_OTHER): Payer: Commercial Managed Care - HMO | Admitting: Urology

## 2015-05-02 VITALS — BP 144/77 | HR 67 | Ht 61.0 in | Wt 139.8 lb

## 2015-05-02 DIAGNOSIS — R300 Dysuria: Secondary | ICD-10-CM | POA: Diagnosis not present

## 2015-05-02 DIAGNOSIS — N390 Urinary tract infection, site not specified: Secondary | ICD-10-CM | POA: Diagnosis not present

## 2015-05-02 DIAGNOSIS — R32 Unspecified urinary incontinence: Secondary | ICD-10-CM | POA: Diagnosis not present

## 2015-05-02 DIAGNOSIS — N952 Postmenopausal atrophic vaginitis: Secondary | ICD-10-CM

## 2015-05-02 LAB — URINALYSIS, COMPLETE
Bilirubin, UA: NEGATIVE
Glucose, UA: NEGATIVE
Ketones, UA: NEGATIVE
NITRITE UA: POSITIVE — AB
PH UA: 6 (ref 5.0–7.5)
Protein, UA: NEGATIVE
Specific Gravity, UA: 1.01 (ref 1.005–1.030)
Urobilinogen, Ur: 0.2 mg/dL (ref 0.2–1.0)

## 2015-05-02 LAB — MICROSCOPIC EXAMINATION
EPITHELIAL CELLS (NON RENAL): NONE SEEN /HPF (ref 0–10)
RBC MICROSCOPIC, UA: NONE SEEN /HPF (ref 0–?)

## 2015-05-02 MED ORDER — ESTRADIOL 0.1 MG/GM VA CREA: TOPICAL_CREAM | VAGINAL | Status: DC

## 2015-05-02 MED ORDER — ESTROGENS, CONJUGATED 0.625 MG/GM VA CREA: 1.0000 | TOPICAL_CREAM | Freq: Every day | VAGINAL | Status: DC

## 2015-05-02 NOTE — Progress Notes (Signed)
In and Out Catheterization  Patient is present today for a I & O catheterization due to needing a clean catch urine. Patient was cleaned and prepped in a sterile fashion with betadine and Lidocaine 2% jelly was instilled into the urethra.  A 14FR cath was inserted no complications were noted , 60ml of urine return was noted, urine was clear and light yellow  in color. A clean urine sample was collected for urinalysis. Bladder was drained and catheter was removed with out difficulty.    Preformed by: Dallas Schimkeamona Williams CMA, Rupert Stackshelsea Watkins LPN

## 2015-05-02 NOTE — Progress Notes (Signed)
4:03 PM   [name] 1922/03/20 [id number]  Referring provider: Marguarite Arbour, MD [address] [facility] [place], [place] [address]  Chief Complaint  Patient presents with  . Dysuria    follow up 1 month    HPI: Patient is a 82 year old Caucasian female with a history of recurrent urinary tract infections and atrophic vaginitis who presents today for follow-up after being treated for an Pseudomonas infection diagnosed on 04/16/2015.     Background history She has had a documented UTI in 08/21/2014 for Kleb, a documented UTI for E. Coli and a recently documented UTI with Pseudomonas on 04/16/2015.    Her daughter states that she has been having difficulty with recurrent UTI's for the last two years.   Her baseline urinary symptoms consist of SUI for which she wears depends, nocturia x 1 and hesitancy.    Today, she states that she is still experiencing some urinary incontinence which she is contributing to a UTI.   She is not experiencing any dysuria, gross hematuria and suprapubic pain.  She denies any recent fevers, chills, nausea or vomiting.   Her UA today is nitrate positive with many bacteria and 6-10 WBC's per high prior field.  She is currently taking Myrbetriq 25 mg 1 tablet daily for her urinary incontinence.  She is finding it somewhat effective.   PMH: Past Medical History  Diagnosis Date  . Hyperlipidemia   . Hypertension   . Hypothyroidism   . GERD (gastroesophageal reflux disease)   . Pneumonia   . Lymphedema   . CKD (chronic kidney disease), stage III   . Anemia   . Chronic kidney disease     Stage 3  . OA (osteoarthritis)   . Heart disease   . History of stomach ulcers   . Recurrent UTI     Surgical History: Past Surgical History  Procedure Laterality Date  . Cholecystectomy    . Abdominal hysterectomy    . Coronary artery bypass graft      Home Medications:    Medication List       This list is accurate as of:  05/02/15  4:03 PM.  Always use your most recent med list.               ALPRAZolam 1 MG tablet  Commonly known as:  XANAX  Take 1 mg by mouth at bedtime. For anxiety     amLODipine 2.5 MG tablet  Commonly known as:  NORVASC  Take 2.5 mg by mouth every morning.     aspirin 81 MG EC tablet  Take 81 mg by mouth daily.     conjugated estrogens vaginal cream  Commonly known as:  PREMARIN  Place 1 Applicatorful vaginally daily. Apply 0.5mg  (pea-sized amount)  just inside the vaginal introitus with a finger-tip every night for two weeks and then Monday, Wednesday and Friday nights.     Cranberry 500 MG Chew  Chew by mouth.     docusate sodium 100 MG capsule  Commonly known as:  COLACE  Take 100 mg by mouth daily. Reported on 05/02/2015     estradiol 0.1 MG/GM vaginal cream  Commonly known as:  ESTRACE VAGINAL  Apply 0.5mg  (pea-sized amount)  just inside the vaginal introitus with a finger-tip every night for two weeks and then Monday, Wednesday and Friday nights.     estradiol 0.1 MG/GM vaginal cream  Commonly known as:  ESTRACE VAGINAL  Apply 0.5mg  (pea-sized amount)  just inside the vaginal introitus with a finger-tip every night for two weeks and then Monday, Wednesday and Friday nights.     gabapentin 100 MG capsule  Commonly known as:  NEURONTIN  Take 100 mg by mouth 3 (three) times daily.     HYDROcodone-acetaminophen 10-325 MG tablet  Commonly known as:  NORCO  Take 1 tablet by mouth every 4 (four) hours as needed for moderate pain.     KLOR-CON M20 20 MEQ tablet  Generic drug:  potassium chloride SA  Reported on 04/04/2015     levothyroxine 100 MCG tablet  Commonly known as:  SYNTHROID, LEVOTHROID  Take 100 mcg by mouth daily.     loperamide 2 MG capsule  Commonly known as:  IMODIUM  Take 2 mg by mouth as needed for diarrhea or loose stools. Reported on 05/02/2015     metoprolol succinate 50 MG 24 hr tablet  Commonly known as:  TOPROL-XL  Take 50 mg by mouth  daily.     mirtazapine 7.5 MG tablet  Commonly known as:  REMERON  Take 7.5 mg by mouth at bedtime.     MYRBETRIQ 25 MG Tb24 tablet  Generic drug:  mirabegron ER  Take 25 mg by mouth daily. Reported on 05/02/2015     neomycin-bacitracin-polymyxin [phone] ointment  Apply 1 application topically as needed (skin tears, abrasions, or minor irritations). Reported on 04/04/2015     nitrofurantoin (macrocrystal-monohydrate) 100 MG capsule  Commonly known as:  MACROBID  Take 1 capsule (100 mg total) by mouth every 12 (twelve) hours.     omeprazole 20 MG capsule  Commonly known as:  PRILOSEC  Take 20 mg by mouth daily. Reported on 04/16/2015     pantoprazole 40 MG tablet  Commonly known as:  PROTONIX  Take 40 mg by mouth daily.     potassium chloride 20 MEQ packet  Commonly known as:  KLOR-CON  Take 20 mEq by mouth 2 (two) times daily.     simvastatin 40 MG tablet  Commonly known as:  ZOCOR  Take 40 mg by mouth at bedtime.     solifenacin 10 MG tablet  Commonly known as:  VESICARE  Take by mouth. Reported on 05/02/2015     sucralfate 1 g tablet  Commonly known as:  CARAFATE  Take 1 g by mouth daily.     torsemide 10 MG tablet  Commonly known as:  DEMADEX  Take 10 mg by mouth daily.     vitamin B-12 1000 MCG tablet  Commonly known as:  CYANOCOBALAMIN  Take 1,000 mcg by mouth every morning.        Allergies:  Allergies  Allergen Reactions  . Iodine     Rash/hives  . Tetanus Toxoids Other (See Comments)    From nursing home MAR...side effects unknown    Family History: Family History  Problem Relation Age of Onset  . Kidney disease Neg Hx   . Bladder Cancer Neg Hx     Social History:  reports that she has never smoked. She has never used smokeless tobacco. She reports that she does not drink alcohol or use illicit drugs.  ROS: UROLOGY Frequent Urination?: Yes Hard to postpone urination?: Yes Burning/pain with urination?: No Get up at night to urinate?:  No Leakage of urine?: Yes Urine stream starts and stops?: No Trouble starting stream?: No Do you have to strain to urinate?: No Blood in urine?: No Urinary tract infection?: No Sexually transmitted disease?: No Injury to kidneys  or bladder?: No Painful intercourse?: No Weak stream?: No Currently pregnant?: No Vaginal bleeding?: No Last menstrual period?: n  Gastrointestinal Nausea?: No Vomiting?: No Indigestion/heartburn?: No Diarrhea?: No Constipation?: No  Constitutional Fever: No Night sweats?: No Weight loss?: No Fatigue?: No  Skin Skin rash/lesions?: No Itching?: No  Eyes Blurred vision?: No Double vision?: No  Ears/Nose/Throat Sore throat?: No Sinus problems?: No  Hematologic/Lymphatic Swollen glands?: No Easy bruising?: No  Cardiovascular Leg swelling?: No Chest pain?: No  Respiratory Cough?: No Shortness of breath?: No  Endocrine Excessive thirst?: No  Musculoskeletal Back pain?: No Joint pain?: No  Neurological Headaches?: No Dizziness?: No  Psychologic Depression?: No Anxiety?: Yes  Physical Exam: BP 144/77 mmHg  Pulse 67  Ht  (1.549 m)  Wt 139 lb 12.8 oz (63.413 kg)  BMI 26.43 kg/m2  Constitutional: Well nourished. Alert and oriented, No acute distress. HEENT: [place] AT, moist mucus membranes. Trachea midline, no masses. Cardiovascular: No clubbing, cyanosis, or edema. Respiratory: Normal respiratory effort, no increased work of breathing. GI: Abdomen is soft, non tender, non distended, no abdominal masses. Liver and spleen not palpable.  No hernias appreciated.  Stool sample for occult testing is not indicated.   GU: No CVA tenderness.  Atrophic external genitalia.   Neurologic: Grossly intact, no focal deficits, moving all 4 extremities. Psychiatric: Normal mood and affect.  Laboratory Data: Lab Results  Component Value Date   WBC 5.9 11/04/2014   HGB 10.6* 11/04/2014   HCT 31.6* 11/04/2014   MCV 89.9 11/04/2014    PLT 235 11/04/2014   Lab Results  Component Value Date   CREATININE 1.28* 11/04/2014   Lab Results  Component Value Date   TSH 1.50 02/22/2014      Component Value Date/Time   CHOL 144 12/30/2012 0521   CHOL 194 04/22/2010 2115   HDL 58 12/30/2012 0521   HDL 87 04/22/2010 2115   CHOLHDL 2.2 Ratio 04/22/2010 2115   VLDL 26 12/30/2012 0521   VLDL 25 04/22/2010 2115   LDLCALC 60 12/30/2012 0521   LDLCALC 82 04/22/2010 2115    Lab Results  Component Value Date   AST 27 11/04/2014   Lab Results  Component Value Date   ALT 16 11/04/2014     Urinalysis Results for orders placed or performed in visit on 05/02/15  Microscopic Examination  Result Value Ref Range   WBC, UA 6-10 (A) 0 -  5 /hpf   RBC, UA None seen 0 -  2 /hpf   Epithelial Cells (non renal) None seen 0 - 10 /hpf   Bacteria, UA Many (A) None seen/Few  Urinalysis, Complete  Result Value Ref Range   Specific Gravity, UA 1.010 1.005 - 1.030   pH, UA 6.0 5.0 - 7.5   Color, UA Yellow Yellow   Appearance Ur Clear Clear   Leukocytes, UA Trace (A) Negative   Protein, UA Negative Negative/Trace   Glucose, UA Negative Negative   Ketones, UA Negative Negative   RBC, UA Trace (A) Negative   Bilirubin, UA Negative Negative   Urobilinogen, Ur 0.2 0.2 - 1.0 mg/dL   Nitrite, UA Positive (A) Negative   Microscopic Examination See below:      Assessment & Plan:    1. Recurrent UTI:   Patient has had three documented UTI's over the last year.   She is cathed today for an urine specimen for culture.  UA is suspicious  for infection. I will send it for culture. We will hold  on prescribing an antibiotic at this time until sensitivities are available.    - Urinalysis, Complete - CULTURE, URINE COMPREHENSIVE  2. Incontinence:   Patient will try increasing her Myrbetriq to 50 mg daily to see if it helps control her incontinence more effectively.  She will RTC in one month for PVR.    3. Atrophic vaginitis:   Patient  will continue to apply the vaginal estrogen cream on Monday, Wednesday and Friday nights.   Return for pending labs.  These notes generated with voice recognition software. I apologize for typographical errors.  Michiel CowboySHANNON Sayler Mickiewicz, PA-C  [facility] [address], [address] [place], [place] [address] [phone]

## 2015-05-03 ENCOUNTER — Telehealth: Payer: Self-pay | Admitting: *Deleted

## 2015-05-03 NOTE — Telephone Encounter (Signed)
Pharmacy calling about the RX for patient it was sent in for premarin and estrace. After speaking with Carollee HerterShannon she states she wanted to know which one was cheaper for the patient. I called pharmacy back to let them know.

## 2015-05-04 LAB — CULTURE, URINE COMPREHENSIVE

## 2015-05-06 ENCOUNTER — Telehealth: Payer: Self-pay | Admitting: *Deleted

## 2015-05-06 DIAGNOSIS — N390 Urinary tract infection, site not specified: Secondary | ICD-10-CM

## 2015-05-06 MED ORDER — AMOXICILLIN-POT CLAVULANATE 875-125 MG PO TABS: 1.0000 | ORAL_TABLET | Freq: Two times a day (BID) | ORAL | Status: DC

## 2015-05-06 NOTE — Telephone Encounter (Signed)
-----   Message from Harle BattiestShannon A McGowan, PA-C sent at 05/05/2015  2:43 PM EDT ----- Patient has a +UCx.  They need to start Augmentin 875/125 one twice daily for seven days and then we need to check a CATH specimen in 3 to 5 days after they complete their antibiotics.  Patient's daughter would like to be notified.

## 2015-05-06 NOTE — Telephone Encounter (Signed)
Spoke with patient's daughter and gave results. Went over med and how to take it. [facility] [address]

## 2015-05-13 ENCOUNTER — Telehealth: Payer: Self-pay | Admitting: Urology

## 2015-05-13 DIAGNOSIS — N390 Urinary tract infection, site not specified: Secondary | ICD-10-CM

## 2015-05-13 NOTE — Telephone Encounter (Signed)
Ms. [name]'s daughter called saying she was told her mother needs to come back in for a UTI check after finishing medication. She took her last pill of Augmentin today and she's wondering if [name] should come in this week for a follow-up appointment or if she can just drop off a urine sample. She'd like a phone call regarding this.  Pt's ph# [phone] Thank you.

## 2015-05-14 NOTE — Telephone Encounter (Signed)
LMOM

## 2015-05-14 NOTE — Telephone Encounter (Signed)
Spoke with pt daughter in reference to cath specimen. Made aware if facility cant cath her then she will need to come into the office. Erin BlaseDebbie stated that she will bring pt. Nurse visit was set up for Friday 05/17/15.

## 2015-05-17 ENCOUNTER — Ambulatory Visit (INDEPENDENT_AMBULATORY_CARE_PROVIDER_SITE_OTHER): Payer: Commercial Managed Care - HMO | Admitting: *Deleted

## 2015-05-17 DIAGNOSIS — N390 Urinary tract infection, site not specified: Secondary | ICD-10-CM | POA: Diagnosis not present

## 2015-05-17 LAB — URINALYSIS, COMPLETE
Bilirubin, UA: NEGATIVE
GLUCOSE, UA: NEGATIVE
KETONES UA: NEGATIVE
Leukocytes, UA: NEGATIVE
NITRITE UA: NEGATIVE
Protein, UA: NEGATIVE
SPEC GRAV UA: 1.01 (ref 1.005–1.030)
Urobilinogen, Ur: 0.2 mg/dL (ref 0.2–1.0)
pH, UA: 5 (ref 5.0–7.5)

## 2015-05-17 LAB — MICROSCOPIC EXAMINATION: Bacteria, UA: NONE SEEN

## 2015-05-17 NOTE — Progress Notes (Signed)
In and Out Catheterization  Patient is present today for a I & O catheterization due to recheck after antibiotics for infection (uti). Patient was cleaned and prepped in a sterile fashion with betadine and Lidocaine 2% jelly was instilled into the urethra.  A 14FR cath was inserted no complications were noted , 50ml of urine return was noted, urine was clear and light yellow in color. A clean urine sample was collected for Urinalysis and culture. Bladder was drained and catheter was removed with out difficulty.    Preformed by: Dallas Schimkeamona Naviyah Schaffert CMA

## 2015-05-20 LAB — CULTURE, URINE COMPREHENSIVE

## 2015-05-22 ENCOUNTER — Telehealth: Payer: Self-pay | Admitting: *Deleted

## 2015-05-22 NOTE — Telephone Encounter (Signed)
Patient's daughter called to find out urine culture results from the 7th of April. I looked at the results and because it was negative with no growth I passed this along to her. She was relieved.

## 2015-05-27 ENCOUNTER — Telehealth: Payer: Self-pay | Admitting: Cardiovascular Disease

## 2015-05-27 NOTE — Telephone Encounter (Signed)
lmov for patient to call back and reschedule the appointment   °Due to Provider not being in office.   °

## 2015-05-28 ENCOUNTER — Telehealth: Payer: Self-pay | Admitting: Urology

## 2015-05-28 DIAGNOSIS — N3281 Overactive bladder: Secondary | ICD-10-CM

## 2015-05-28 MED ORDER — MIRABEGRON ER 50 MG PO TB24
50.0000 mg | ORAL_TABLET | Freq: Every day | ORAL | Status: DC
Start: 2015-05-28 — End: 2015-08-30

## 2015-05-28 NOTE — Telephone Encounter (Signed)
Medication sent to pt pharmacy and daughter made aware.

## 2015-05-28 NOTE — Telephone Encounter (Signed)
Eunice Blaseebbie, Ms. Vanterpool's daughter called saying she was given samples of Myrbetriq and has about five pills left...50MG . She's wondering if a Rx can be sent to Ms. Wessinger's pharmacy or if she can get more samples. She'd like a phone call regarding this.  Pt's ph# [phone] Thank you.

## 2015-05-30 ENCOUNTER — Telehealth: Payer: Self-pay | Admitting: Urology

## 2015-05-30 DIAGNOSIS — I10 Essential (primary) hypertension: Secondary | ICD-10-CM | POA: Diagnosis not present

## 2015-05-30 DIAGNOSIS — R609 Edema, unspecified: Secondary | ICD-10-CM | POA: Diagnosis not present

## 2015-05-30 DIAGNOSIS — E7800 Pure hypercholesterolemia, unspecified: Secondary | ICD-10-CM | POA: Diagnosis not present

## 2015-05-30 DIAGNOSIS — E039 Hypothyroidism, unspecified: Secondary | ICD-10-CM | POA: Diagnosis not present

## 2015-05-30 DIAGNOSIS — Z79899 Other long term (current) drug therapy: Secondary | ICD-10-CM | POA: Diagnosis not present

## 2015-05-30 NOTE — Telephone Encounter (Signed)
[name] called about patient's prescription for Myrbetriq.  She picked up a 30-day supply at the local pharmacy, but would like a 90-day supply sent to the [facility].  Please contact her and let her know when this has been done.  She can be reached at [phone].

## 2015-06-11 NOTE — Telephone Encounter (Signed)
Spoke with pt daughter on 06/07/15 and made aware medication has been sent to pharmacy. Daughter voiced understanding at that time.

## 2015-06-13 ENCOUNTER — Ambulatory Visit: Payer: Commercial Managed Care - HMO | Admitting: Cardiovascular Disease

## 2015-06-14 ENCOUNTER — Ambulatory Visit (INDEPENDENT_AMBULATORY_CARE_PROVIDER_SITE_OTHER): Payer: Commercial Managed Care - HMO

## 2015-06-14 DIAGNOSIS — Z961 Presence of intraocular lens: Secondary | ICD-10-CM | POA: Diagnosis not present

## 2015-06-14 DIAGNOSIS — N390 Urinary tract infection, site not specified: Secondary | ICD-10-CM | POA: Diagnosis not present

## 2015-06-14 NOTE — Progress Notes (Signed)
In and Out Catheterization  Patient is present today for a I & O catheterization due to possible UTI. Patient was cleaned and prepped in a sterile fashion with betadine and Lidocaine 2% jelly was instilled into the urethra.  A 14FR cath was inserted no complications were noted , 300ml of urine return was noted, urine was clear in color. A clean urine sample was collected for u/a and cx. Bladder was drained  And catheter was removed with out difficulty.    Preformed by: Rupert Stackshelsea Watkins, LPN   Follow up/ Additional notes: pt daughter called stating pt has been confused for the past couple of days so they did an at home UTI test that came back "positive". Daughter wanted pt to be cath'd and check for a UTI.

## 2015-06-15 LAB — MICROSCOPIC EXAMINATION
BACTERIA UA: NONE SEEN
Epithelial Cells (non renal): NONE SEEN /hpf (ref 0–10)
RBC, UA: NONE SEEN /hpf (ref 0–?)
WBC UA: NONE SEEN /HPF (ref 0–?)

## 2015-06-15 LAB — URINALYSIS, COMPLETE
Bilirubin, UA: NEGATIVE
GLUCOSE, UA: NEGATIVE
Ketones, UA: NEGATIVE
LEUKOCYTES UA: NEGATIVE
Nitrite, UA: NEGATIVE
PH UA: 5 (ref 5.0–7.5)
PROTEIN UA: NEGATIVE
Specific Gravity, UA: <1.005 — ABNORMAL LOW (ref 1.005–1.030)
UUROB: 0.2 mg/dL (ref 0.2–1.0)

## 2015-06-16 LAB — CULTURE, URINE COMPREHENSIVE

## 2015-06-17 ENCOUNTER — Telehealth: Payer: Self-pay

## 2015-06-17 ENCOUNTER — Other Ambulatory Visit: Payer: Self-pay

## 2015-06-17 DIAGNOSIS — N390 Urinary tract infection, site not specified: Secondary | ICD-10-CM

## 2015-06-17 MED ORDER — AMOXICILLIN-POT CLAVULANATE 875-125 MG PO TABS
1.0000 | ORAL_TABLET | Freq: Two times a day (BID) | ORAL | Status: DC
Start: 2015-06-17 — End: 2015-06-20

## 2015-06-17 NOTE — Telephone Encounter (Signed)
-----   Message from Vanna ScotlandAshley Brandon, MD sent at 06/17/2015  8:25 AM EDT ----- Interestingly, this patient's UA was completely negative without leukocytes, red blood cells, or nitrites.  Despite this, her culture returned positive for Klebsiella. This is more consistent with asymptomatic bacteriuria, but in the setting of her worsening confusion, we will go ahead and treat this.  Please prescribed the patient Augmentin twice a day 3 days.  Vanna ScotlandAshley Brandon, MD

## 2015-06-17 NOTE — Telephone Encounter (Signed)
Spoke with pt daughter, Eunice BlaseDebbie, in reference to pt +ucx. Made [name] aware augmentin has been sent to pharmacy. [name] voiced understanding and [facility] Orders faxed.

## 2015-06-17 NOTE — Telephone Encounter (Signed)
-----   Message from Vanna ScotlandAshley Brandon, MD sent at 06/15/2015  3:45 PM EDT ----- Please let this patient no there is no evidence of UTI. Her urine looks excellent.  Vanna ScotlandAshley Brandon, MD

## 2015-06-20 ENCOUNTER — Ambulatory Visit (INDEPENDENT_AMBULATORY_CARE_PROVIDER_SITE_OTHER): Payer: Commercial Managed Care - HMO | Admitting: Cardiovascular Disease

## 2015-06-20 ENCOUNTER — Encounter: Payer: Self-pay | Admitting: Cardiovascular Disease

## 2015-06-20 VITALS — BP 136/70 | HR 69 | Ht 61.0 in | Wt 138.4 lb

## 2015-06-20 DIAGNOSIS — I10 Essential (primary) hypertension: Secondary | ICD-10-CM

## 2015-06-20 DIAGNOSIS — I129 Hypertensive chronic kidney disease with stage 1 through stage 4 chronic kidney disease, or unspecified chronic kidney disease: Secondary | ICD-10-CM

## 2015-06-20 DIAGNOSIS — R609 Edema, unspecified: Secondary | ICD-10-CM

## 2015-06-20 DIAGNOSIS — I25701 Atherosclerosis of coronary artery bypass graft(s), unspecified, with angina pectoris with documented spasm: Secondary | ICD-10-CM | POA: Diagnosis not present

## 2015-06-20 DIAGNOSIS — I25700 Atherosclerosis of coronary artery bypass graft(s), unspecified, with unstable angina pectoris: Secondary | ICD-10-CM

## 2015-06-20 DIAGNOSIS — N183 Chronic kidney disease, stage 3 (moderate): Secondary | ICD-10-CM

## 2015-06-20 DIAGNOSIS — M25473 Effusion, unspecified ankle: Secondary | ICD-10-CM

## 2015-06-20 NOTE — Assessment & Plan Note (Signed)
Currently with no symptoms of angina. No further workup at this time. Continue current medication regimen. 

## 2015-06-20 NOTE — Progress Notes (Signed)
Patient ID: [name], female    DOB: 10/14/1922, 82 y.o.   MRN: [id number]  HPI Comments: 82 year-old woman with a history of bypass surgery in 2003, stress test April 2010 showing inferior wall ischemia with cardiac catheterization at that time showing occlusion of her native RCA and occlusion of her vein graft to the PDA with collateralized vessels from left to right, mild carotid arterial disease,  who presents for routine followup of her coronary artery disease.  She has a history of diverticular disease and significant GI bleeding. Previous history of falls.   In follow-up today, she presents with her daughter [facility], Active, walks 2 miles per day with no significant symptoms of chest discomfort Weight has increased from 128 up to 138 pounds since her last clinic visit She has had significant problems with urinary tract infections On her last clinic visit we decreased the dose of torsemide, currently takes 10 mg every other day On the slower regimen, creatinine down to 1.1, previously 1.5 Minimal leg swelling worse than the right ankle than the left Currently walks with a walker  Prior lab work reviewed with her from June 2016 showing total cholesterol 137, LDL 67  EKG on today's visit shows normal sinus rhythm with rate 69 bpm, no significant ST or T-wave changes  Other past medical history Previously had lymphedema symptoms, improved with a pump She walks with a walker.  She's not doing any regular exercise.  Previous cortisone shots to her back and hips with significant improvement of her pain.  Her husband died In 2013. He died of complications while in the hospital.    episode of chest pain in November 2014. She was evaluated in the hospital, ruled out for MI, had a stress test that showed no ischemia on 12/30/2012. She was started on sucralfate and symptoms have resolved.   Cholesterol higher at the end of 2014 when she was off her simvastatin.  Total cholesterol that time 250.   Cholesterol in May 2014 was 212, up from 170 in 2013.     Allergies  Allergen Reactions  . Iodine     Rash/hives  . Tetanus Toxoids Other (See Comments)    From nursing home MAR...side effects unknown    Outpatient Encounter Prescriptions as of 06/20/2015  Medication Sig  . ALPRAZolam (XANAX) 1 MG tablet Take 1 mg by mouth at bedtime. For anxiety  . amLODipine (NORVASC) 2.5 MG tablet Take 2.5 mg by mouth every morning.   Marland Kitchen. aspirin 81 MG EC tablet Take 81 mg by mouth daily.    Marland Kitchen. conjugated estrogens (PREMARIN) vaginal cream Place 1 Applicatorful vaginally daily. Apply 0.5mg  (pea-sized amount)  just inside the vaginal introitus with a finger-tip every night for two weeks and then Monday, Wednesday and Friday nights.  . Cranberry 500 MG CHEW Chew 1 tablet by mouth daily.   Marland Kitchen. docusate sodium (COLACE) 100 MG capsule Take 100 mg by mouth daily. Reported on 05/02/2015  . estradiol (ESTRACE VAGINAL) 0.1 MG/GM vaginal cream Apply 0.5mg  (pea-sized amount)  just inside the vaginal introitus with a finger-tip every night for two weeks and then Monday, Wednesday and Friday nights.  . gabapentin (NEURONTIN) 100 MG capsule Take 100 mg by mouth 3 (three) times daily.   Marland Kitchen. HYDROcodone-acetaminophen (NORCO) 10-325 MG per tablet Take 1 tablet by mouth every 4 (four) hours as needed for moderate pain.  Marland Kitchen. levothyroxine (SYNTHROID, LEVOTHROID) 100 MCG tablet Take 100 mcg by mouth daily.  .Marland Kitchen  loperamide (IMODIUM) 2 MG capsule Take 2 mg by mouth as needed for diarrhea or loose stools. Reported on 05/02/2015  . metoprolol succinate (TOPROL-XL) 50 MG 24 hr tablet Take 50 mg by mouth daily.  . mirabegron ER (MYRBETRIQ) 50 MG TB24 tablet Take 1 tablet (50 mg total) by mouth daily.  . mirtazapine (REMERON) 7.5 MG tablet Take 7.5 mg by mouth at bedtime.  Marland Kitchen neomycin-bacitracin-polymyxin (NEOSPORIN) [phone] ointment Apply 1 application topically as needed (skin tears, abrasions, or  minor irritations). Reported on 04/04/2015  . nitrofurantoin, macrocrystal-monohydrate, (MACROBID) 100 MG capsule Take 1 capsule (100 mg total) by mouth every 12 (twelve) hours.  Marland Kitchen omeprazole (PRILOSEC) 20 MG capsule Take 20 mg by mouth daily. Reported on 04/16/2015  . pantoprazole (PROTONIX) 40 MG tablet Take 40 mg by mouth daily.  . potassium chloride SA (KLOR-CON M20) 20 MEQ tablet Take 20 mEq by mouth 3 (three) times a week. Reported on 04/04/2015  . simvastatin (ZOCOR) 40 MG tablet Take 40 mg by mouth at bedtime.    . solifenacin (VESICARE) 10 MG tablet Take 10 mg by mouth daily. Reported on 05/02/2015  . sucralfate (CARAFATE) 1 G tablet Take 1 g by mouth daily.   Marland Kitchen torsemide (DEMADEX) 10 MG tablet Take 10 mg by mouth daily.  . vitamin B-12 (CYANOCOBALAMIN) 1000 MCG tablet Take 1,000 mcg by mouth every morning.   . [DISCONTINUED] amoxicillin-clavulanate (AUGMENTIN) 875-125 MG tablet Take 1 tablet by mouth 2 (two) times daily.  . [DISCONTINUED] estradiol (ESTRACE VAGINAL) 0.1 MG/GM vaginal cream Apply 0.5mg  (pea-sized amount)  just inside the vaginal introitus with a finger-tip every night for two weeks and then Monday, Wednesday and Friday nights.  . [DISCONTINUED] mirabegron ER (MYRBETRIQ) 25 MG TB24 tablet Take 25 mg by mouth daily. Reported on 05/02/2015  . [DISCONTINUED] potassium chloride (KLOR-CON) 20 MEQ packet Take 20 mEq by mouth 2 (two) times daily.   No facility-administered encounter medications on file as of 06/20/2015.    Past Medical History  Diagnosis Date  . Hyperlipidemia   . Hypertension   . Hypothyroidism   . GERD (gastroesophageal reflux disease)   . Pneumonia   . Lymphedema   . CKD (chronic kidney disease), stage III   . Anemia   . Chronic kidney disease     Stage 3  . OA (osteoarthritis)   . Heart disease   . History of stomach ulcers   . Recurrent UTI     Past Surgical History  Procedure Laterality Date  . Cholecystectomy    . Abdominal hysterectomy    .  Coronary artery bypass graft      Social History  reports that she has never smoked. She has never used smokeless tobacco. She reports that she does not drink alcohol or use illicit drugs.  Family History family history is negative for Kidney disease and Bladder Cancer.  Review of Systems  Constitutional: Negative.   Respiratory: Negative.   Cardiovascular: Negative.   Gastrointestinal: Negative.   Musculoskeletal: Positive for back pain and gait problem.  Skin: Negative.   Neurological: Negative.   Hematological: Negative.   Psychiatric/Behavioral: Negative.   All other systems reviewed and are negative.   BP 136/70 mmHg  Pulse 69  Ht 5\' 1"  (1.549 m)  Wt 138 lb 6.4 oz (62.778 kg)  BMI 26.16 kg/m2  Physical Exam  Constitutional: She is oriented to person, place, and time. She appears well-developed and well-nourished.  HENT:  Head: Normocephalic.  Nose: Nose normal.  Mouth/Throat:  Oropharynx is clear and moist.  Eyes: Conjunctivae are normal. Pupils are equal, round, and reactive to light.  Neck: Normal range of motion. Neck supple. No JVD present.  Cardiovascular: Normal rate, regular rhythm, S1 normal, S2 normal, normal heart sounds and intact distal pulses.  Exam reveals no gallop and no friction rub.   No murmur heard. Trace edema around the right ankle  Pulmonary/Chest: Effort normal and breath sounds normal. No respiratory distress. She has no wheezes. She has no rales. She exhibits no tenderness.  Abdominal: Soft. Bowel sounds are normal. She exhibits no distension. There is no tenderness.  Musculoskeletal: Normal range of motion. She exhibits no edema or tenderness.  Lymphadenopathy:    She has no cervical adenopathy.  Neurological: She is alert and oriented to person, place, and time. Coordination normal.  Skin: Skin is warm and dry. No rash noted. No erythema.  Psychiatric: She has a normal mood and affect. Her behavior is normal. Judgment and thought content  normal.    Assessment and Plan  Nursing note and vitals reviewed.

## 2015-06-20 NOTE — Assessment & Plan Note (Signed)
Creatinine improved down to normal range by decreasing torsemide Blood pressure stable

## 2015-06-20 NOTE — Patient Instructions (Signed)
You are doing well. No medication changes were made.  Please call us if you have new issues that need to be addressed before your next appt.  Your physician wants you to follow-up in: 12 months.  You will receive a reminder letter in the mail two months in advance. If you don't receive a letter, please call our office to schedule the follow-up appointment. 

## 2015-06-20 NOTE — Assessment & Plan Note (Signed)
Ankle swelling worse on the right than the left, trace pitting edema, no changes to her medication

## 2015-06-21 ENCOUNTER — Ambulatory Visit (INDEPENDENT_AMBULATORY_CARE_PROVIDER_SITE_OTHER): Payer: Commercial Managed Care - HMO

## 2015-06-21 ENCOUNTER — Telehealth: Payer: Self-pay

## 2015-06-21 DIAGNOSIS — N390 Urinary tract infection, site not specified: Secondary | ICD-10-CM | POA: Diagnosis not present

## 2015-06-21 LAB — URINALYSIS, COMPLETE
BILIRUBIN UA: NEGATIVE
GLUCOSE, UA: NEGATIVE
KETONES UA: NEGATIVE
LEUKOCYTES UA: NEGATIVE
NITRITE UA: NEGATIVE
Protein, UA: NEGATIVE
RBC UA: NEGATIVE
Urobilinogen, Ur: 0.2 mg/dL (ref 0.2–1.0)
pH, UA: 5 (ref 5.0–7.5)

## 2015-06-21 LAB — MICROSCOPIC EXAMINATION
Bacteria, UA: NONE SEEN
RBC MICROSCOPIC, UA: NONE SEEN /HPF (ref 0–?)
WBC, UA: NONE SEEN /hpf (ref 0–?)

## 2015-06-21 NOTE — Progress Notes (Signed)
In and Out Catheterization  Patient is present today for a I & O catheterization due to recurrent UTI. Patient was cleaned and prepped in a sterile fashion with betadine and Lidocaine 2% jelly was instilled into the urethra.  A 14FR cath was inserted no complications were noted , 250ml of urine return was noted, urine was clear and light yellow in color. A clean urine sample was collected for u/a and cx. Bladder was drained  And catheter was removed with out difficulty.    Preformed by: Rupert Stackshelsea Ellin Fitzgibbons, LPN

## 2015-06-21 NOTE — Telephone Encounter (Signed)
Spoke with [name] in reference to cath specimen for pt. Erin BlaseDebbie will bring pt in at 2:00 due to provider schedule.

## 2015-06-24 ENCOUNTER — Telehealth: Payer: Self-pay

## 2015-06-24 LAB — CULTURE, URINE COMPREHENSIVE

## 2015-06-24 NOTE — Telephone Encounter (Signed)
-----   Message from Vanna ScotlandAshley Brandon, MD sent at 06/22/2015 11:26 AM EDT ----- UA negative for evidence of infection.  Vanna ScotlandAshley Brandon, MD

## 2015-06-24 NOTE — Telephone Encounter (Signed)
LMOM- most recent labs are negative.  

## 2015-07-09 ENCOUNTER — Telehealth: Payer: Self-pay

## 2015-07-09 ENCOUNTER — Ambulatory Visit (INDEPENDENT_AMBULATORY_CARE_PROVIDER_SITE_OTHER): Payer: Commercial Managed Care - HMO

## 2015-07-09 VITALS — BP 174/77 | HR 71 | Temp 98.6°F | Wt 140.8 lb

## 2015-07-09 DIAGNOSIS — N390 Urinary tract infection, site not specified: Secondary | ICD-10-CM

## 2015-07-09 LAB — URINALYSIS, COMPLETE
Bilirubin, UA: NEGATIVE
Glucose, UA: NEGATIVE
Ketones, UA: NEGATIVE
LEUKOCYTES UA: NEGATIVE
Nitrite, UA: NEGATIVE
PH UA: 5 (ref 5.0–7.5)
Protein, UA: NEGATIVE
Specific Gravity, UA: <1.005 — ABNORMAL LOW (ref 1.005–1.030)
Urobilinogen, Ur: 0.2 mg/dL (ref 0.2–1.0)

## 2015-07-09 LAB — MICROSCOPIC EXAMINATION
BACTERIA UA: NONE SEEN
EPITHELIAL CELLS (NON RENAL): NONE SEEN /HPF (ref 0–10)
RBC, UA: NONE SEEN /hpf (ref 0–?)
WBC, UA: NONE SEEN /hpf (ref 0–?)

## 2015-07-09 NOTE — Telephone Encounter (Signed)
Pt daughter called stating pt used a OTC uti strip over the weekend and it came back "positive". Daughter requested pt come in for a cath specimen today as she will be going out of town tomorrow. [facility]. Pt was added to nurse schedule for today, but made daughter aware [facility]. Daughter voiced understanding stating she will make an appt at check out today.

## 2015-07-09 NOTE — Progress Notes (Signed)
In and Out Catheterization  Patient is present today for a I & O catheterization due to possible UTI. Patient was cleaned and prepped in a sterile fashion with betadine and Lidocaine 2% jelly was instilled into the urethra.  A 14FR cath was inserted no complications were noted , 600ml of urine return was noted, urine was clear in color. A clean urine sample was collected for u/a and cx. Bladder was drained  And catheter was removed with out difficulty.    Preformed by: Rupert Stackshelsea Ibrahim Mcpheeters, LPN   Follow up/ Additional notes: After seeing the urine and pt not having any complaints daughter stated she may just be a little over protective as she is leaving town. Reinforced with daughter should the cx come back positive should f/u with [facility]. Reinforced if the cx comes back negative [facility] only likes to treat symptomatic UTIs. Reinforced with pt and daughter s/s of a UTI that would need to be addressed. Pt and daughter voiced understanding.

## 2015-07-11 LAB — CULTURE, URINE COMPREHENSIVE

## 2015-07-12 ENCOUNTER — Telehealth: Payer: Self-pay | Admitting: Radiology

## 2015-07-12 DIAGNOSIS — N390 Urinary tract infection, site not specified: Secondary | ICD-10-CM

## 2015-07-12 MED ORDER — SULFAMETHOXAZOLE-TRIMETHOPRIM 800-160 MG PO TABS: 1.0000 | ORAL_TABLET | Freq: Two times a day (BID) | ORAL | Status: AC

## 2015-07-12 NOTE — Telephone Encounter (Signed)
Patient's daughter, Eunice BlaseDebbie, was notified of the antibiotic prescription.  She would like for it to be called into the [facility] [address] [place].  She will have someone pick it up and [facility]  [phone].

## 2015-07-12 NOTE — Telephone Encounter (Signed)
Pt's daughter asks for ucx results. Please advise. Return call to [name] at [phone].

## 2015-07-12 NOTE — Telephone Encounter (Signed)
Please call in Septra DS, one tablet twice daily # 7.

## 2015-07-12 NOTE — Telephone Encounter (Signed)
Her only complaint is frequency.

## 2015-07-12 NOTE — Telephone Encounter (Signed)
I have sent a note to [name].  I need to know what symptoms Mrs. [name] is having.

## 2015-07-12 NOTE — Telephone Encounter (Signed)
Medication sent to pharmacy, daughter made aware, and [facility].

## 2015-07-12 NOTE — Telephone Encounter (Signed)
-----   Message from Harle BattiestShannon A McGowan, PA-C sent at 07/11/2015  2:31 PM EDT ----- Please ask the patient what symptoms she is currently experiencing.  I do not want to treat the UTI if this is a colonization.

## 2015-07-24 ENCOUNTER — Ambulatory Visit (INDEPENDENT_AMBULATORY_CARE_PROVIDER_SITE_OTHER): Payer: Commercial Managed Care - HMO

## 2015-07-24 DIAGNOSIS — N390 Urinary tract infection, site not specified: Secondary | ICD-10-CM | POA: Diagnosis not present

## 2015-07-24 LAB — URINALYSIS, COMPLETE
BILIRUBIN UA: NEGATIVE
Glucose, UA: NEGATIVE
KETONES UA: NEGATIVE
LEUKOCYTES UA: NEGATIVE
NITRITE UA: NEGATIVE
PH UA: 5 (ref 5.0–7.5)
Protein, UA: NEGATIVE
RBC UA: NEGATIVE
UUROB: 0.2 mg/dL (ref 0.2–1.0)

## 2015-07-24 LAB — MICROSCOPIC EXAMINATION
EPITHELIAL CELLS (NON RENAL): NONE SEEN /HPF (ref 0–10)
RBC MICROSCOPIC, UA: NONE SEEN /HPF (ref 0–?)
WBC UA: NONE SEEN /HPF (ref 0–?)

## 2015-07-24 NOTE — Progress Notes (Signed)
In and Out Catheterization  Patient is present today for a I & O catheterization due to post abx therapy. Patient was cleaned and prepped in a sterile fashion with betadine and Lidocaine 2% jelly was instilled into the urethra.  A 14FR cath was inserted no complications were noted , 300ml of urine return was noted, urine was yellow in color. A clean urine sample was collected for u/a and cx. Bladder was drained  And catheter was removed with out difficulty.    Preformed by: Chelsea Watkins, LPN    

## 2015-07-25 DIAGNOSIS — E7800 Pure hypercholesterolemia, unspecified: Secondary | ICD-10-CM | POA: Diagnosis not present

## 2015-07-25 DIAGNOSIS — I10 Essential (primary) hypertension: Secondary | ICD-10-CM | POA: Diagnosis not present

## 2015-07-25 DIAGNOSIS — Z79899 Other long term (current) drug therapy: Secondary | ICD-10-CM | POA: Diagnosis not present

## 2015-07-25 DIAGNOSIS — E039 Hypothyroidism, unspecified: Secondary | ICD-10-CM | POA: Diagnosis not present

## 2015-07-26 LAB — CULTURE, URINE COMPREHENSIVE

## 2015-07-29 ENCOUNTER — Telehealth: Payer: Self-pay

## 2015-07-29 DIAGNOSIS — N390 Urinary tract infection, site not specified: Secondary | ICD-10-CM

## 2015-07-29 MED ORDER — NITROFURANTOIN MONOHYD MACRO 100 MG PO CAPS: 100.0000 mg | ORAL_CAPSULE | Freq: Two times a day (BID) | ORAL | Status: DC

## 2015-07-29 NOTE — Telephone Encounter (Signed)
Spoke with pt daughter, Eunice BlaseDebbie, in reference to +ucx. Made [name] aware abx have been sent to pt pharmacy and [facility]. Also made [name] aware pt should f/u with [facility] as pt keeps getting UTI. [name] voiced understanding and was transferred to the front to make f/u appt.

## 2015-07-29 NOTE — Telephone Encounter (Signed)
-----   Message from Harle BattiestShannon A McGowan, PA-C sent at 07/28/2015  8:05 PM EDT ----- Patient has a +UCx.  They need to start Macrobid 100 mg one capsule twice daily for seven days and then she needs an office visit.

## 2015-08-02 ENCOUNTER — Telehealth: Payer: Self-pay

## 2015-08-02 DIAGNOSIS — E039 Hypothyroidism, unspecified: Secondary | ICD-10-CM | POA: Diagnosis not present

## 2015-08-02 DIAGNOSIS — Z87440 Personal history of urinary (tract) infections: Secondary | ICD-10-CM | POA: Diagnosis not present

## 2015-08-02 DIAGNOSIS — I10 Essential (primary) hypertension: Secondary | ICD-10-CM | POA: Diagnosis not present

## 2015-08-02 DIAGNOSIS — E7800 Pure hypercholesterolemia, unspecified: Secondary | ICD-10-CM | POA: Diagnosis not present

## 2015-08-02 DIAGNOSIS — N390 Urinary tract infection, site not specified: Secondary | ICD-10-CM

## 2015-08-02 MED ORDER — NITROFURANTOIN MONOHYD MACRO 100 MG PO CAPS: 100.0000 mg | ORAL_CAPSULE | Freq: Once | ORAL | Status: DC

## 2015-08-02 NOTE — Telephone Encounter (Signed)
3 pills were sent to pharmacy.

## 2015-08-02 NOTE — Telephone Encounter (Signed)
She can have enough to get her to her Monday appointment.

## 2015-08-02 NOTE — Telephone Encounter (Signed)
Pt pharmacy sent a refill request of macrobid daily. Please advise.

## 2015-08-05 ENCOUNTER — Encounter: Payer: Self-pay | Admitting: Urology

## 2015-08-05 ENCOUNTER — Ambulatory Visit (INDEPENDENT_AMBULATORY_CARE_PROVIDER_SITE_OTHER): Payer: Commercial Managed Care - HMO | Admitting: Urology

## 2015-08-05 VITALS — BP 117/69 | HR 64 | Ht 63.0 in | Wt 137.0 lb

## 2015-08-05 DIAGNOSIS — I2510 Atherosclerotic heart disease of native coronary artery without angina pectoris: Secondary | ICD-10-CM | POA: Insufficient documentation

## 2015-08-05 DIAGNOSIS — R32 Unspecified urinary incontinence: Secondary | ICD-10-CM

## 2015-08-05 DIAGNOSIS — M1990 Unspecified osteoarthritis, unspecified site: Secondary | ICD-10-CM | POA: Insufficient documentation

## 2015-08-05 DIAGNOSIS — I10 Essential (primary) hypertension: Secondary | ICD-10-CM | POA: Insufficient documentation

## 2015-08-05 DIAGNOSIS — K259 Gastric ulcer, unspecified as acute or chronic, without hemorrhage or perforation: Secondary | ICD-10-CM | POA: Insufficient documentation

## 2015-08-05 DIAGNOSIS — K449 Diaphragmatic hernia without obstruction or gangrene: Secondary | ICD-10-CM | POA: Insufficient documentation

## 2015-08-05 DIAGNOSIS — N952 Postmenopausal atrophic vaginitis: Secondary | ICD-10-CM | POA: Diagnosis not present

## 2015-08-05 DIAGNOSIS — N390 Urinary tract infection, site not specified: Secondary | ICD-10-CM | POA: Diagnosis not present

## 2015-08-05 DIAGNOSIS — E039 Hypothyroidism, unspecified: Secondary | ICD-10-CM | POA: Insufficient documentation

## 2015-08-05 MED ORDER — ESTRADIOL 0.1 MG/GM VA CREA: TOPICAL_CREAM | VAGINAL | Status: DC

## 2015-08-05 NOTE — Progress Notes (Signed)
11:42 AM   [name] 1922-03-28 [id number]  Referring provider: Marguarite Arbour, MD [address] [facility] [place], [place] [address]  Chief Complaint  Patient presents with  . Follow-up    recurrent UTI    HPI: Patient is a 82 year old Caucasian female with a history of recurrent urinary tract infections and atrophic vaginitis who presents today for follow-up.  Patient had recently had an Escherichia coli urinary tract infection that was treated with Macrobid.  Her pharmacy contact us requesting a refill on her Macrobid.    Today, she states that she is feeling fine. She is not having symptoms of a urinary tract infection at this time.  She is not experiencing dysuria, gross hematuria or suprapubic pain. She is not had fevers, chills, nausea or vomiting.  She is using vaginal estrogen cream.  She is currently taking Myrbetriq 50 mg, 1 tablet daily for her urinary incontinence.  She is finding it effective.  Her baseline urinary symptoms consist of SUI for which she wears depends, nocturia x 1 and hesitancy.     PMH: Past Medical History  Diagnosis Date  . Hyperlipidemia   . Hypertension   . Hypothyroidism   . GERD (gastroesophageal reflux disease)   . Pneumonia   . Lymphedema   . CKD (chronic kidney disease), stage III   . Anemia   . Chronic kidney disease     Stage 3  . OA (osteoarthritis)   . Heart disease   . History of stomach ulcers   . Recurrent UTI     Surgical History: Past Surgical History  Procedure Laterality Date  . Cholecystectomy    . Abdominal hysterectomy    . Coronary artery bypass graft      Home Medications:    Medication List       This list is accurate as of: 08/05/15 11:42 AM.  Always use your most recent med list.               ALPRAZolam 1 MG tablet  Commonly known as:  XANAX  Take 1 mg by mouth at bedtime. For anxiety     amLODipine 2.5 MG tablet  Commonly known as:  NORVASC  Take 2.5 mg by  mouth every morning.     aspirin 81 MG EC tablet  Take 81 mg by mouth daily.     conjugated estrogens vaginal cream  Commonly known as:  PREMARIN  Place 1 Applicatorful vaginally daily. Apply 0.5mg  (pea-sized amount)  just inside the vaginal introitus with a finger-tip every night for two weeks and then Monday, Wednesday and Friday nights.     Cranberry 500 MG Chew  Chew 1 tablet by mouth daily.     docusate sodium 100 MG capsule  Commonly known as:  COLACE  Take 100 mg by mouth daily. Reported on 05/02/2015     estradiol 0.1 MG/GM vaginal cream  Commonly known as:  ESTRACE VAGINAL  Apply 0.5mg  (pea-sized amount)  just inside the vaginal introitus with a finger-tip every night for two weeks and then Monday, Wednesday and Friday nights.     estradiol 0.1 MG/GM vaginal cream  Commonly known as:  ESTRACE VAGINAL  Apply 0.5mg  (pea-sized amount)  just inside the vaginal introitus with a finger-tip every night for two weeks and then Monday, Wednesday and Friday nights.     gabapentin 100 MG capsule  Commonly known as:  NEURONTIN  Take 100 mg by mouth 3 (three) times daily.  HYDROcodone-acetaminophen 10-325 MG tablet  Commonly known as:  NORCO  Take 1 tablet by mouth every 4 (four) hours as needed for moderate pain. Reported on 08/05/2015     KLOR-CON M20 20 MEQ tablet  Generic drug:  potassium chloride SA  Take 20 mEq by mouth 3 (three) times a week. Reported on 04/04/2015     levothyroxine 75 MCG tablet  Commonly known as:  SYNTHROID, LEVOTHROID     loperamide 2 MG capsule  Commonly known as:  IMODIUM  Take 2 mg by mouth as needed for diarrhea or loose stools. Reported on 05/02/2015     metoprolol succinate 50 MG 24 hr tablet  Commonly known as:  TOPROL-XL  Take 50 mg by mouth daily.     mirabegron ER 50 MG Tb24 tablet  Commonly known as:  MYRBETRIQ  Take 1 tablet (50 mg total) by mouth daily.     mirtazapine 7.5 MG tablet  Commonly known as:  REMERON  Take 7.5 mg by  mouth at bedtime.     neomycin-bacitracin-polymyxin [id number] ointment  Apply 1 application topically as needed (skin tears, abrasions, or minor irritations). Reported on 04/04/2015     omeprazole 20 MG capsule  Commonly known as:  PRILOSEC  Take 20 mg by mouth daily. Reported on 04/16/2015     pantoprazole 40 MG tablet  Commonly known as:  PROTONIX  Take 40 mg by mouth daily.     simvastatin 40 MG tablet  Commonly known as:  ZOCOR  Take 40 mg by mouth at bedtime.     sucralfate 1 g tablet  Commonly known as:  CARAFATE  Take 1 g by mouth daily.     torsemide 10 MG tablet  Commonly known as:  DEMADEX  Take 10 mg by mouth daily.     vitamin B-12 1000 MCG tablet  Commonly known as:  CYANOCOBALAMIN  Take 1,000 mcg by mouth every morning.        Allergies:  Allergies  Allergen Reactions  . Augmentin [Amoxicillin-Pot Clavulanate] Diarrhea    Pt c/o severe stomach pain with diarrhea   . Iodine     Rash/hives  . Tetanus Toxoids Other (See Comments)    From nursing home [facility]...side effects unknown    Family History: Family History  Problem Relation Age of Onset  . Kidney disease Neg Hx   . Bladder Cancer Neg Hx     Social History:  reports that she has never smoked. She has never used smokeless tobacco. She reports that she does not drink alcohol or use illicit drugs.  ROS: UROLOGY Frequent Urination?: Yes Hard to postpone urination?: No Burning/pain with urination?: No Get up at night to urinate?: Yes Leakage of urine?: Yes Urine stream starts and stops?: No Trouble starting stream?: No Do you have to strain to urinate?: No Blood in urine?: No Urinary tract infection?: Yes Sexually transmitted disease?: No Injury to kidneys or bladder?: No Painful intercourse?: No Weak stream?: No Currently pregnant?: No Vaginal bleeding?: No Last menstrual period?: n  Gastrointestinal Nausea?: No Vomiting?: No Indigestion/heartburn?: No Diarrhea?: No Constipation?:  No  Constitutional Fever: No Night sweats?: No Weight loss?: No Fatigue?: No  Skin Skin rash/lesions?: No Itching?: No  Eyes Blurred vision?: No Double vision?: No  Ears/Nose/Throat Sore throat?: No Sinus problems?: No  Hematologic/Lymphatic Swollen glands?: No Easy bruising?: No  Cardiovascular Leg swelling?: No Chest pain?: No  Respiratory Cough?: No Shortness of breath?: No  Endocrine Excessive thirst?: No  Musculoskeletal Back  pain?: No Joint pain?: No  Neurological Headaches?: No Dizziness?: No  Psychologic Depression?: No Anxiety?: Yes  Physical Exam: BP 117/69 mmHg  Pulse 64  Ht 5\' 3"  (1.6 m)  Wt 137 lb (62.143 kg)  BMI 24.27 kg/m2  Constitutional: Well nourished. Alert and oriented, No acute distress. HEENT: [place] AT, moist mucus membranes. Trachea midline, no masses. Cardiovascular: No clubbing, cyanosis, or edema. Respiratory: Normal respiratory effort, no increased work of breathing. GI: Abdomen is soft, non tender, non distended, no abdominal masses. Liver and spleen not palpable.  No hernias appreciated.  Stool sample for occult testing is not indicated.   GU: No CVA tenderness.  Atrophic external genitalia.   Neurologic: Grossly intact, no focal deficits, moving all 4 extremities. Psychiatric: Normal mood and affect.  Laboratory Data: Lab Results  Component Value Date   WBC 5.9 11/04/2014   HGB 10.6* 11/04/2014   HCT 31.6* 11/04/2014   MCV 89.9 11/04/2014   PLT 235 11/04/2014   Lab Results  Component Value Date   CREATININE 1.28* 11/04/2014   Lab Results  Component Value Date   TSH 1.50 02/22/2014      Component Value Date/Time   CHOL 144 12/30/2012 0521   CHOL 194 04/22/2010 2115   HDL 58 12/30/2012 0521   HDL 87 04/22/2010 2115   CHOLHDL 2.2 Ratio 04/22/2010 2115   VLDL 26 12/30/2012 0521   VLDL 25 04/22/2010 2115   LDLCALC 60 12/30/2012 0521   LDLCALC 82 04/22/2010 2115    Lab Results  Component Value Date    AST 27 11/04/2014   Lab Results  Component Value Date   ALT 16 11/04/2014   + Pseudomonas aeruginosa on 04/16/2015 + E. Coli on 05/02/2015 + Klebsiella pneumoniae on 06/14/2015 + Klebsiella pneumoniae on 07/09/2015 + E. Coli on 07/24/2015   Assessment & Plan:    1. Recurrent UTI:   Patient is instructed to increase her water intake until the urine is pale yellow or clear.  I have advised her to take probiotics (yogurt, oral pills or vaginal suppositories), take cranberry pills or drink the juice and use the estrogen cream.  She is to take Vitamin C 1,000 mg daily to acidify the urine.  She should also avoid soaking in tubs and wipe front to back after urinating.    Because of her history of recurrent UTI's, I have asked the patient to contact our office if she should experience symptoms of urinary tract infection so that we can CATH her for an urine specimen for urinalysis and culture. This is to prevent a skin contaminant from showing up in the urine culture.  If she should have her symptoms after hours or cannot get to our office, she should notify her other providers that she needs a catheterized specimen for UA and culture.   I reviewed the symptoms of a urinary tract infection, such as a worsening of urinary urgency and frequency, dysuria, which is painful urination and not the pain of urine hitting sensitive perineal skin, hematuria, foul-smelling urine, suprapubic pain or mental status changes. Fevers, chills, nausea and or vomiting can also be signs of a possible UTI.  Positive urinalyses and positive urine cultures that are not associated with urinary symptoms should not be treated with antibiotics.    I explained to the patient that being exposed to unnecessary antibiotics can put her at risk for increasing resistance of the bacteria to antibiotics, C. difficile and the side effects of the antibiotics.  2. Incontinence:   Patient will continue  her Myrbetriq to 50 mg daily.  She will RTC in three months for PVR.    3. Atrophic vaginitis:   Patient will continue to apply the vaginal estrogen cream on Monday, Wednesday and Friday nights.  RTC in 3 months for exam.     Return in about 3 months (around 11/05/2015) for PVR and exam.  These notes generated with voice recognition software. I apologize for typographical errors.  Michiel CowboySHANNON Traevon Meiring, PA-C  [facility] [address], [address] [place], [place] [address] [phone]

## 2015-08-29 ENCOUNTER — Telehealth: Payer: Self-pay | Admitting: Urology

## 2015-08-29 DIAGNOSIS — N3281 Overactive bladder: Secondary | ICD-10-CM

## 2015-08-29 NOTE — Telephone Encounter (Signed)
Patient's daughter called the office today.  Patient stopped taking Macrobid for a time.  She is requesting that Carollee HerterShannon send in another [facility] [address].  Patient is having trouble with increasing urinary frequency and incontinence.  Contact her if there are any questions or concerns.

## 2015-08-30 MED ORDER — MIRABEGRON ER 50 MG PO TB24: 50.0000 mg | ORAL_TABLET | Freq: Every day | ORAL | Status: DC

## 2015-08-30 NOTE — Telephone Encounter (Signed)
Spoke with pt daughter, Eunice BlaseDebbie, who stated pt has gone off myrbetriq but now would like to go back on it. Per Carollee HerterShannon medication has been sent to pharmacy.

## 2015-08-30 NOTE — Telephone Encounter (Signed)
Would you call the daughter and get some clarification on what medication they are requesting to get refilled?  Macrobid is an antibiotic and does not treat frequency and/or incontinence.  Myrbetriq is the medication for these symptoms.

## 2015-09-06 ENCOUNTER — Telehealth: Payer: Self-pay | Admitting: Urology

## 2015-09-06 NOTE — Telephone Encounter (Signed)
Is the medication she is speaking about Myrbetriq?

## 2015-09-06 NOTE — Telephone Encounter (Signed)
Spoke with [name] and she advises patient needs to come in on the nurse schedule Monday morning at 9:00 am to get a PVR. Spoke with patient's daughter [name] to let her know and she states what about her infection and I stated we don't  know that she has an infection and she has been on antibiotics and no symptoms.  [name] states that's just it we never know she has one until Segundo does a culture. I let [name] know that we already have her on the Nurse schedule for Monday morning and that I would discuss that with [facility] and if needed we can collect that on Monday morning also if needed. Patient daughter ok with plan.

## 2015-09-06 NOTE — Telephone Encounter (Signed)
Patient restarted her meds on the 26th and she is still wetting her pants and using 5-6 pads a day and it is still going thru her pants. How long does it take for the medication to start working? And /or does she need to come in to be seen or is this a sign of an infection?   Marcelino Duster

## 2015-09-06 NOTE — Telephone Encounter (Signed)
Spoke with patient's daughter,  [name] is the medication she is talking about and she states her mother never has any signs or symptoms and does not hurt for anything but she thinks she is having way too many voids. I booked the patient to have a office visit with you on August 7th at 2:15. Patient's daughter wanted to know if she needs to come in for a cath specimen before then. I stated because she is not having any symptoms that I would talk with [name] and let her know.

## 2015-09-09 ENCOUNTER — Ambulatory Visit: Payer: Commercial Managed Care - HMO

## 2015-09-09 ENCOUNTER — Ambulatory Visit: Payer: Commercial Managed Care - HMO | Admitting: Family Medicine

## 2015-09-09 VITALS — BP 152/70 | HR 74 | Ht 63.0 in | Wt 129.6 lb

## 2015-09-09 DIAGNOSIS — N3281 Overactive bladder: Secondary | ICD-10-CM

## 2015-09-09 DIAGNOSIS — N390 Urinary tract infection, site not specified: Secondary | ICD-10-CM | POA: Diagnosis not present

## 2015-09-09 DIAGNOSIS — N952 Postmenopausal atrophic vaginitis: Secondary | ICD-10-CM

## 2015-09-09 LAB — URINALYSIS, COMPLETE
BILIRUBIN UA: NEGATIVE
Glucose, UA: NEGATIVE
Ketones, UA: NEGATIVE
LEUKOCYTES UA: NEGATIVE
Nitrite, UA: NEGATIVE
PH UA: 5 (ref 5.0–7.5)
PROTEIN UA: NEGATIVE
RBC, UA: NEGATIVE
Specific Gravity, UA: <1.005 — ABNORMAL LOW (ref 1.005–1.030)
Urobilinogen, Ur: 0.2 mg/dL (ref 0.2–1.0)

## 2015-09-09 LAB — MICROSCOPIC EXAMINATION
BACTERIA UA: NONE SEEN
WBC, UA: NONE SEEN /hpf (ref 0–?)

## 2015-09-09 LAB — BLADDER SCAN AMB NON-IMAGING: Scan Result: 13

## 2015-09-09 MED ORDER — MIRABEGRON ER 50 MG PO TB24: 50.0000 mg | ORAL_TABLET | Freq: Every day | ORAL | 3 refills | Status: DC

## 2015-09-09 MED ORDER — ESTRADIOL 0.1 MG/GM VA CREA: TOPICAL_CREAM | VAGINAL | 12 refills | Status: DC

## 2015-09-09 NOTE — Addendum Note (Signed)
Addended by: Honor Loh on: 09/09/2015 10:58 AM   Modules accepted: Orders

## 2015-09-09 NOTE — Progress Notes (Signed)
Bladder Scan Patient can void: 13 ml Performed By: Teressa Lower, CMA

## 2015-09-16 ENCOUNTER — Ambulatory Visit: Payer: Commercial Managed Care - HMO | Admitting: Urology

## 2015-09-19 ENCOUNTER — Telehealth: Payer: Self-pay | Admitting: Cardiovascular Disease

## 2015-09-19 NOTE — Telephone Encounter (Signed)
Pt c/o swelling: STAT is pt has developed SOB within 24 hours  1. How long have you been experiencing swelling? About a week   2. Where is the swelling located? Ankle and feet  3.  Are you currently taking a "fluid pill"? She is on it. Is taking them  4.  Are you currently SOB? Pt states she is not. But patient daughter has noticed some  5.  Have you traveled recently? No    Pt is having to keep her legs up all day in order to keep it down Would like some advise on this.

## 2015-09-19 NOTE — Telephone Encounter (Signed)
Patients daughter called in stating that her mother is having some ankle and feet swelling. She reports that it is not so much in her legs but just ankles and feet. She states that she called PCP and he is out this week. Patient denies shortness of breath but daughter states that she recognizes some with activity. Instructed her to make sure she is wearing compression socks/hose and to elevate feet on 2 to 3 pillows. Let her know that Dr. Mariah MillingGollan is out this week as well but that we could get her scheduled to come in and see him. She is going to try to get to PCP next week and then we also scheduled her with Dr. Mariah MillingGollan 10/09/15 at 3:00pm. Let her know that if she should develop any shortness of breath, chest pain, or any additional symptoms to go to emergency room or walk-in clinic for evaluation. She verbalized understanding of our conversation, agreement with plan of care, and had no further questions at this time.

## 2015-09-20 ENCOUNTER — Telehealth: Payer: Self-pay

## 2015-09-20 NOTE — Telephone Encounter (Signed)
Pt daughter, Eunice BlaseDebbie, called stating pt has been taking myrbetriq 50mg  and the medication is no longer working. Eunice BlaseDebbie states that pt is still wetting 6 thick pads a day. Please advise.

## 2015-09-21 NOTE — Telephone Encounter (Signed)
Has patient had a trial of oxybutynin(Ditropan), fesoterodine (Toviaz),  tropsium [place](Sanctura), tolterodine (Detrol), darifenacin (Enablex) or solifenacin (Vesicare)?  If not, we can have a trial of one of these medications.  They must realize that this class of medications are on the BEERS list, which is a list of medications not recommended in folks older than 65.  The reason being that these medications have significant side effects.  Dry eyes, dry mouth, constipation, mental confusion and/or urinary retention.  If she has tried and failed the prescription of medications, my recommendations at this time is an indwelling Foley. If she tolerates the indwelling Foley, she may choose to have a suprapubic tube placed.

## 2015-09-23 DIAGNOSIS — I10 Essential (primary) hypertension: Secondary | ICD-10-CM | POA: Diagnosis not present

## 2015-09-23 DIAGNOSIS — R609 Edema, unspecified: Secondary | ICD-10-CM | POA: Diagnosis not present

## 2015-09-23 NOTE — Telephone Encounter (Signed)
Spoke with pt daughter, Eunice BlaseDebbie, in reference to pt having a trial of a different medications. Eunice BlaseDebbie stated that she did not want to try any of those medications due to pt age. Made aware of the option of a foley. Eunice BlaseDebbie stated she will discuss the foley with the pt and call back.

## 2015-09-23 NOTE — Telephone Encounter (Signed)
Pt's daughter Eunice BlaseDebbie called to follow up from Friday message, would like to be reached today on her cell @ [phone].

## 2015-09-28 IMAGING — US US EXTREM LOW VENOUS*R*
1 series · 13 of 24 positions shown · non-contrast
Comparison: Bilateral lower extremity ultrasound 10/16/2012

CLINICAL DATA: 89-year-old female with right thigh pain for 3 days.
Initial encounter.



[Series 1: us extrem low venous*right* · 0.10mm/px · 13 of 33 slices shown]
[im 1/33]
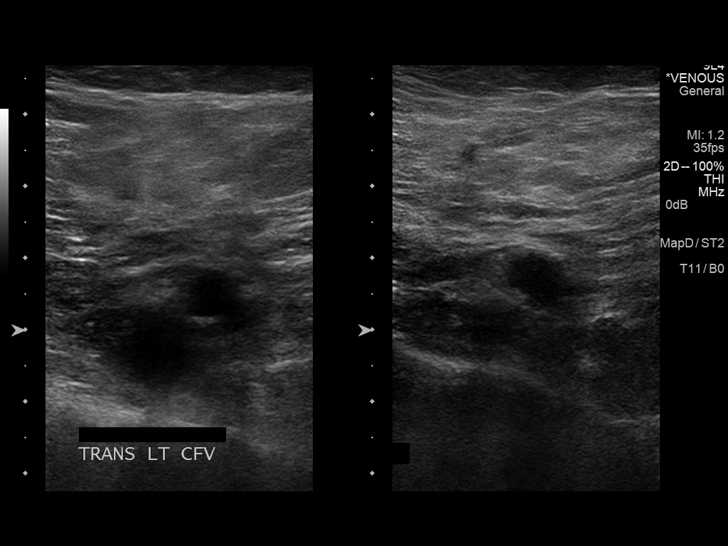
[im 3/33]
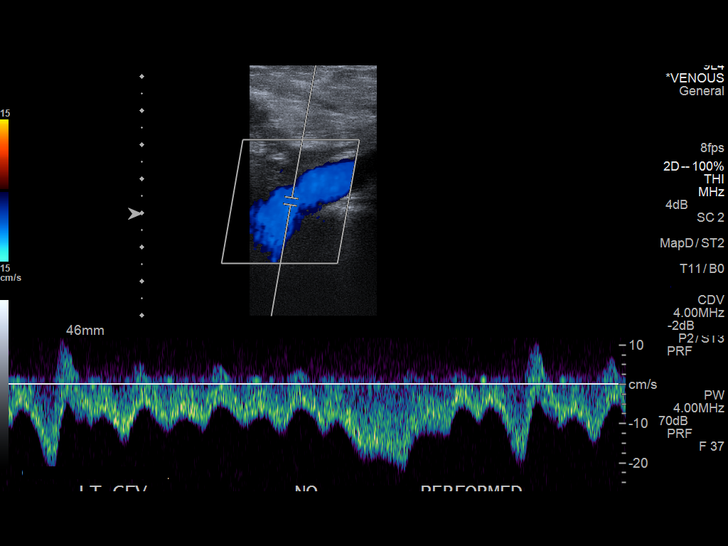
[im 6/33]
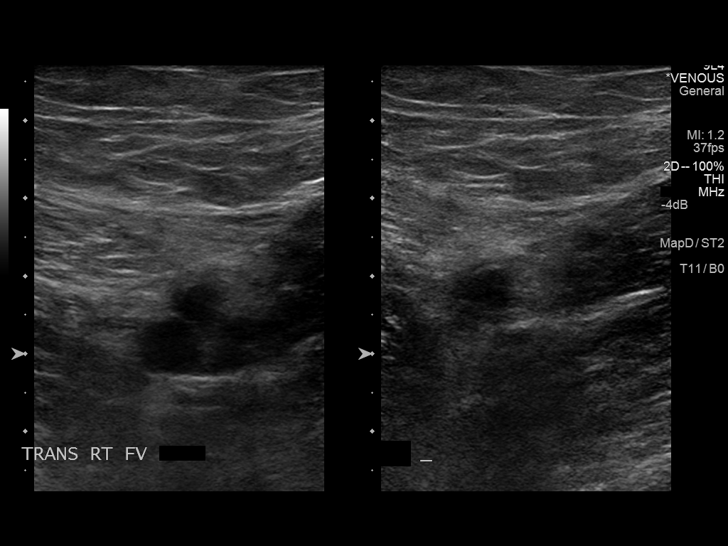
[im 9/33]
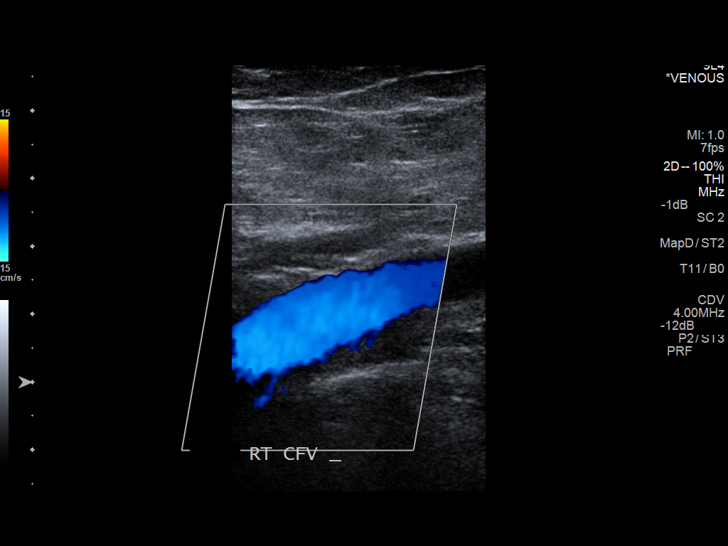
[im 12/33]
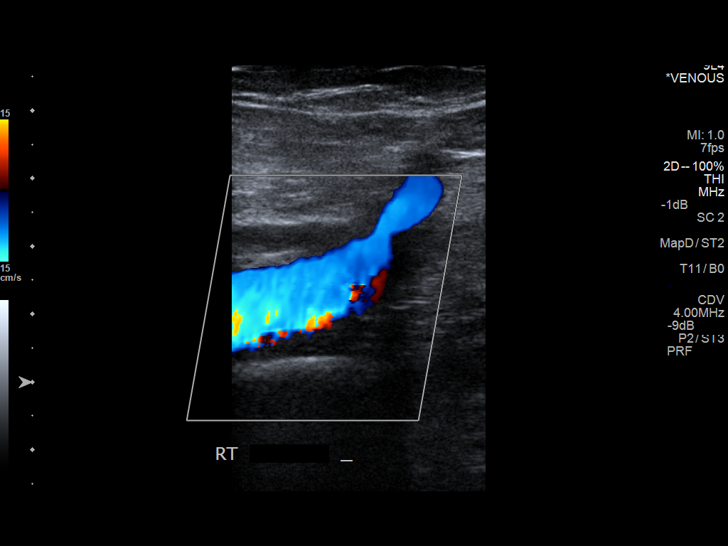
[im 14/33]
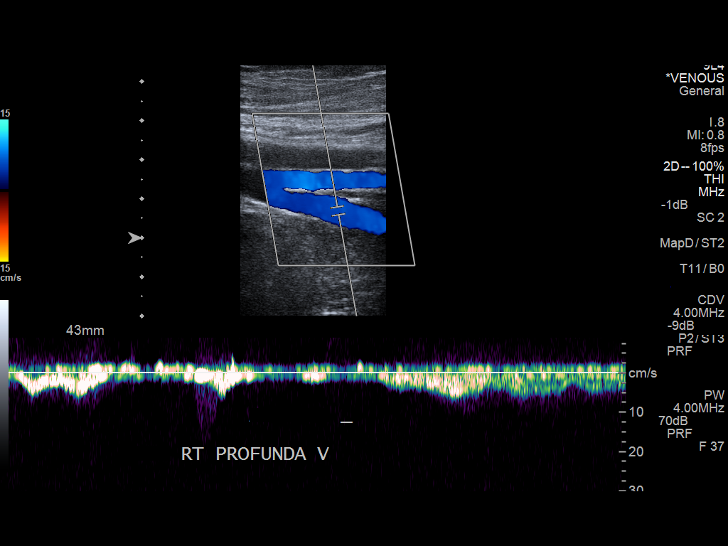
[im 17/33]
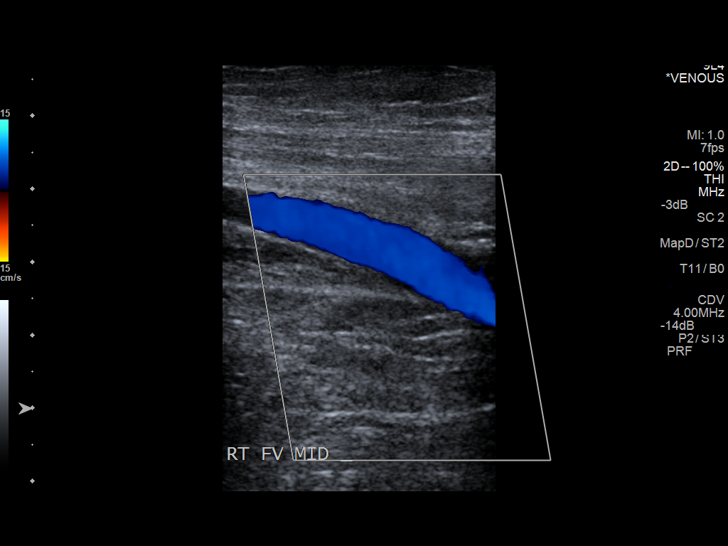
[im 19/33]
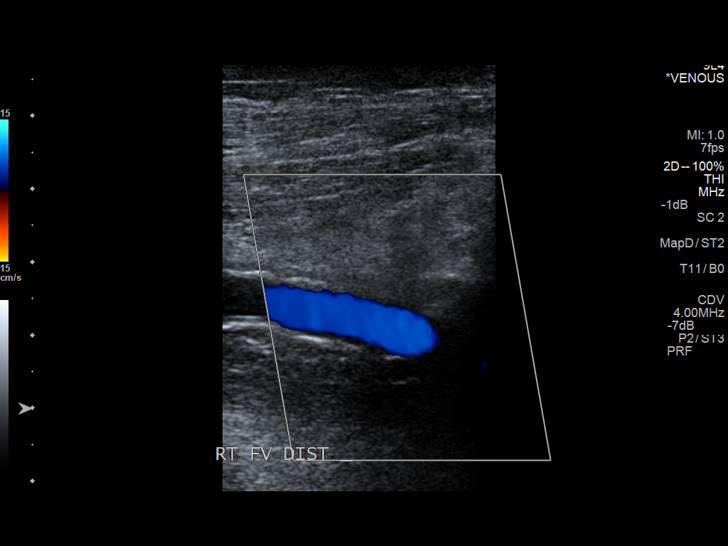
[im 21/33]
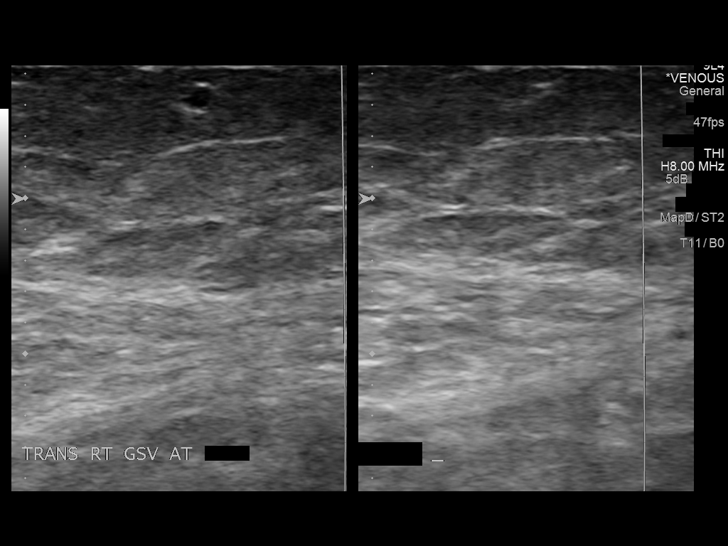
[im 24/33]
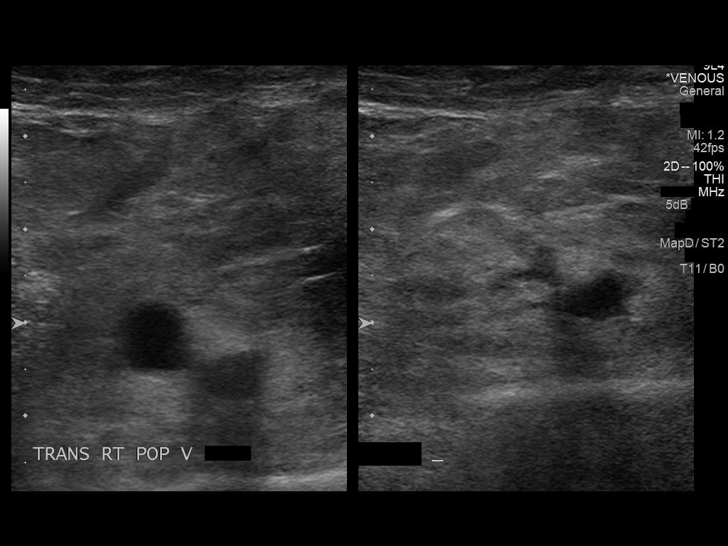
[im 27/33]
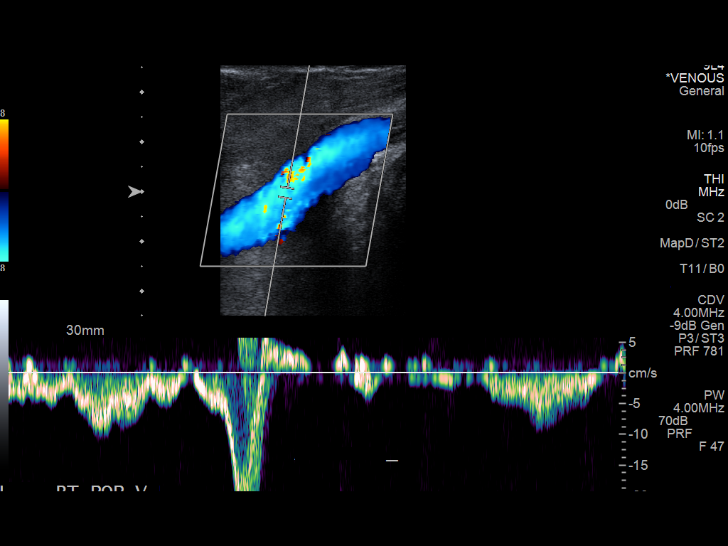
[im 30/33]
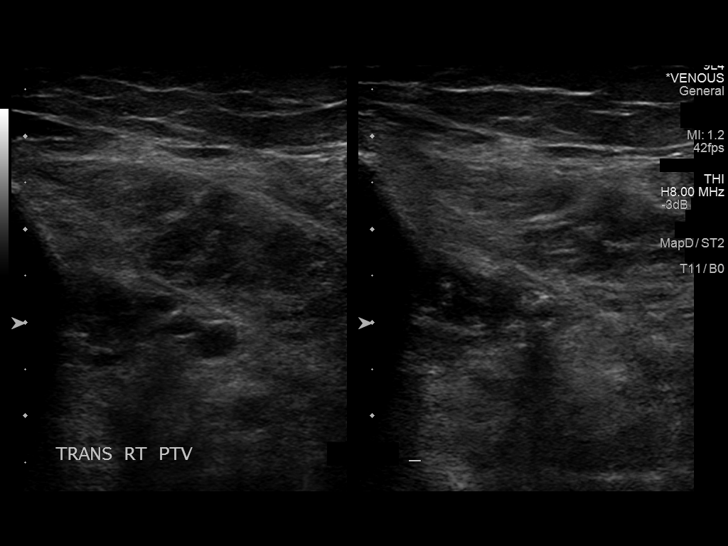
[im 33/33]
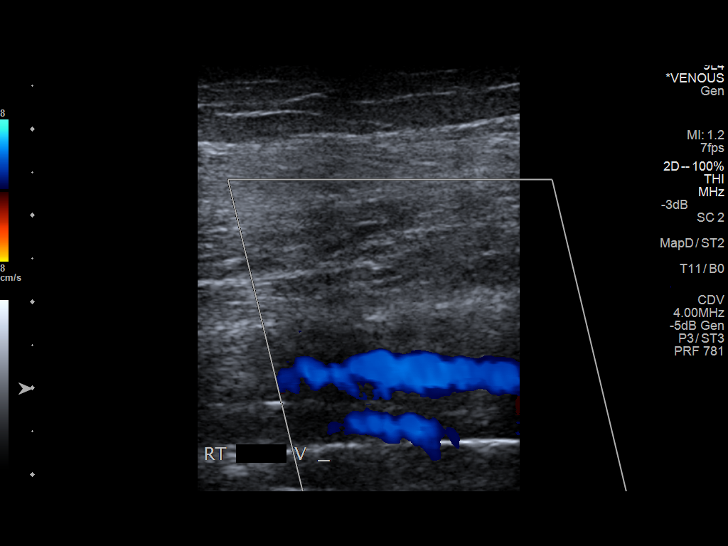

[13 of 24 positions shown; findings below may reference images not displayed]

FINDINGS: Contralateral Common Femoral Vein: Respiratory phasicity is normal
and symmetric with the symptomatic side. No evidence of thrombus.
Normal compressibility.

Common Femoral Vein: No evidence of thrombus. Normal
compressibility, respiratory phasicity and response to augmentation.

Saphenofemoral Junction: No evidence of thrombus. Normal
compressibility and flow on color Doppler imaging.

Profunda Femoral Vein: No evidence of thrombus. Normal
compressibility and flow on color Doppler imaging.

Femoral Vein: No evidence of thrombus. Normal compressibility,
respiratory phasicity and response to augmentation.

Popliteal Vein: No evidence of thrombus. Normal compressibility,
respiratory phasicity and response to augmentation.

Calf Veins: No evidence of thrombus. Normal compressibility and flow
on color Doppler imaging.

Superficial Great Saphenous Vein: No evidence of thrombus. Normal
compressibility and flow on color Doppler imaging.

Venous Reflux:  None.

Other Findings:  None.
IMPRESSION: No evidence of right lower extremity deep venous thrombosis.

Preliminary report without discrepancy to the above called by the
sonographer to RN Tc Kadir at the office of Dr. CHUNKIN APILADO on

## 2015-09-30 ENCOUNTER — Telehealth: Payer: Self-pay

## 2015-09-30 NOTE — Telephone Encounter (Signed)
Pt daughter called stating pt decided at this point not to refill myrbetriq due to medication not helping. Per daughter pt also decided to just continue estrace/premarin and change pads often due to leakage.

## 2015-10-07 NOTE — Telephone Encounter (Signed)
[name] and stated pt wants to start Myrbetriq 50 mg again.  Please give her a call [phone].

## 2015-10-08 NOTE — Telephone Encounter (Signed)
Spoke with pt daughter who stated pt did want to restart myrbetriq. Therefore [facility].

## 2015-10-08 NOTE — Telephone Encounter (Signed)
LMOM

## 2015-10-09 ENCOUNTER — Ambulatory Visit (INDEPENDENT_AMBULATORY_CARE_PROVIDER_SITE_OTHER): Payer: Commercial Managed Care - HMO | Admitting: Cardiovascular Disease

## 2015-10-09 ENCOUNTER — Encounter: Payer: Self-pay | Admitting: Cardiovascular Disease

## 2015-10-09 VITALS — BP 150/80 | HR 61 | Ht 63.0 in | Wt 144.0 lb

## 2015-10-09 DIAGNOSIS — R945 Abnormal results of liver function studies: Principal | ICD-10-CM

## 2015-10-09 DIAGNOSIS — R7989 Other specified abnormal findings of blood chemistry: Secondary | ICD-10-CM

## 2015-10-09 DIAGNOSIS — I25700 Atherosclerosis of coronary artery bypass graft(s), unspecified, with unstable angina pectoris: Secondary | ICD-10-CM

## 2015-10-09 DIAGNOSIS — I10 Essential (primary) hypertension: Secondary | ICD-10-CM | POA: Diagnosis not present

## 2015-10-09 DIAGNOSIS — R609 Edema, unspecified: Secondary | ICD-10-CM

## 2015-10-09 DIAGNOSIS — E785 Hyperlipidemia, unspecified: Secondary | ICD-10-CM

## 2015-10-09 DIAGNOSIS — M25473 Effusion, unspecified ankle: Secondary | ICD-10-CM

## 2015-10-09 NOTE — Progress Notes (Signed)
Cardiology Office Note  Date:  10/09/2015   ID:  [name], DOB 19-Feb-1922, MRN [id number]  PCP:  Marguarite Arbour, MD   Chief Complaint  Patient presents with  . Other    Pt. was taken off the amlodipine due to swelling. Meds reviewed by the patient verbally.     HPI:  82 year-old woman with a history of bypass surgery in 07-11-2001, stress test April 2010 showing inferior wall ischemia with cardiac catheterization at that time showing occlusion of her native RCA and occlusion of her vein graft to the PDA with collateralized vessels from left to right, mild carotid arterial disease,  who presents for routine followup of her coronary artery disease.  She has a history of diverticular disease and significant GI bleeding. Previous history of falls.   Daughter presents with patient today She currently lives at [facility] at 5 AM, doing great Walks 2 miles every day Blood pressure numbers reviewed from [facility] notes, on average 120 up to 130 systolic  Recently had a small increase in ankle edema, seen by primary care, amlodipine held and told to increase torsemide 10 mg up to every day for 1 week starting 09/23/2015 then back down to every other day Swelling has resolved  She reports that she does drink a lot of fluids,  in general drinks water   Lab work reviewed in detail with her Total chol 158, LDL 85 Simvastatin stopped for ALT 53, normal AST by primary care Creatinine 1.1,   Other past medical history Previous issues with urinary tract infections On torsemide daily, creatinine climbed to 1.5  Currently walks with a walker  Prior lab work reviewed with her from June 2016 showing total cholesterol 137, LDL 67  Previously had lymphedema symptoms, improved with a pump She walks with a walker.  She's not doing any regular exercise.  Previous cortisone shots to her back and hips with significant improvement of her pain.  Her husband died In 07-12-2011. He died of  complications while in the hospital.    episode of chest pain in November 2014. She was evaluated in the hospital, ruled out for MI, had a stress test that showed no ischemia on 12/30/2012. She was started on sucralfate and symptoms have resolved.   Cholesterol higher at the end of 2012/07/11 when she was off her simvastatin. Total cholesterol that time 250.   Cholesterol in 06-02-2014was 212, up from 170 in 2011-07-12.  PMH:   has a past medical history of Anemia; Chronic kidney disease; CKD (chronic kidney disease), stage III; GERD (gastroesophageal reflux disease); Heart disease; History of stomach ulcers; Hyperlipidemia; Hypertension; Hypothyroidism; Lymphedema; OA (osteoarthritis); Pneumonia; and Recurrent UTI.  PSH:    Past Surgical History:  Procedure Laterality Date  . ABDOMINAL HYSTERECTOMY    . CHOLECYSTECTOMY    . CORONARY ARTERY BYPASS GRAFT      Current Outpatient Prescriptions  Medication Sig Dispense Refill  . ALPRAZolam (XANAX) 1 MG tablet Take 1 mg by mouth at bedtime. For anxiety    . aspirin 81 MG EC tablet Take 81 mg by mouth daily.      Marland Kitchen conjugated estrogens (PREMARIN) vaginal cream Place 1 Applicatorful vaginally daily. Apply 0.5mg  (pea-sized amount)  just inside the vaginal introitus with a finger-tip every night for two weeks and then Monday, Wednesday and Friday nights. 30 g 12  . Cranberry 500 MG CHEW Chew 1 tablet by mouth daily.     Marland Kitchen docusate sodium (COLACE) 100  MG capsule Take 100 mg by mouth daily. Reported on 05/02/2015    . estradiol (ESTRACE VAGINAL) 0.1 MG/GM vaginal cream Apply 0.5mg  (pea-sized amount)  just inside the vaginal introitus with a finger-tip every night for two weeks and then Monday, Wednesday and Friday nights. 30 g 12  . gabapentin (NEURONTIN) 100 MG capsule Take 100 mg by mouth 3 (three) times daily.     Marland Kitchen HYDROcodone-acetaminophen (NORCO) 10-325 MG per tablet Take 1 tablet by mouth every 4 (four) hours as needed for moderate pain. Reported on  08/05/2015    . levothyroxine (SYNTHROID, LEVOTHROID) 75 MCG tablet     . loperamide (IMODIUM) 2 MG capsule Take 2 mg by mouth as needed for diarrhea or loose stools. Reported on 05/02/2015    . metoprolol succinate (TOPROL-XL) 50 MG 24 hr tablet Take 50 mg by mouth daily.    . mirabegron ER (MYRBETRIQ) 50 MG TB24 tablet Take 1 tablet (50 mg total) by mouth daily. 90 tablet 3  . mirtazapine (REMERON) 7.5 MG tablet Take 7.5 mg by mouth at bedtime.    Marland Kitchen neomycin-bacitracin-polymyxin (NEOSPORIN) [phone] ointment Apply 1 application topically as needed (skin tears, abrasions, or minor irritations). Reported on 04/04/2015    . omeprazole (PRILOSEC) 20 MG capsule Take 20 mg by mouth daily. Reported on 04/16/2015    . pantoprazole (PROTONIX) 40 MG tablet Take 40 mg by mouth daily.    . potassium chloride SA (KLOR-CON M20) 20 MEQ tablet Take 20 mEq by mouth 3 (three) times a week. Reported on 04/04/2015    . simvastatin (ZOCOR) 40 MG tablet Take 40 mg by mouth at bedtime.      . sucralfate (CARAFATE) 1 G tablet Take 1 g by mouth daily.     Marland Kitchen torsemide (DEMADEX) 10 MG tablet Take 10 mg by mouth daily.    . vitamin B-12 (CYANOCOBALAMIN) 1000 MCG tablet Take 1,000 mcg by mouth every morning.      No current facility-administered medications for this visit.      Allergies:   Augmentin [amoxicillin-pot clavulanate]; Iodine; and Tetanus toxoids   Social History:  The patient  reports that she has never smoked. She has never used smokeless tobacco. She reports that she does not drink alcohol or use drugs.   Family History:   family history is not on file.    Review of Systems: Review of Systems  Constitutional: Negative.   Respiratory: Negative.   Cardiovascular: Negative.   Gastrointestinal: Negative.   Musculoskeletal: Negative.        Gait instability  Neurological: Negative.   Psychiatric/Behavioral: Negative.   All other systems reviewed and are negative.    PHYSICAL EXAM: VS:  BP (!)  150/80 (BP Location: Left Arm, Patient Position: Sitting, Cuff Size: Normal)   Pulse 61   Ht 5\' 3"  (1.6 m)   Wt 144 lb (65.3 kg)   BMI 25.51 kg/m  , BMI Body mass index is 25.51 kg/m. GEN: Well nourished, well developed, in no acute distress , elderly, walks with a cane HEENT: normal  Neck: no JVD, carotid bruits, or masses Cardiac: RRR; no murmurs, rubs, or gallops, trace pitting edema around the ankles Respiratory:  clear to auscultation bilaterally, normal work of breathing GI: soft, nontender, nondistended, + BS MS: no deformity or atrophy  Skin: warm and dry, no rash Neuro:  Strength and sensation are intact Psych: euthymic mood, full affect    Recent Labs: 11/04/2014: ALT 16; BUN 34; Creatinine, Ser 1.28; Hemoglobin  10.6; Platelets 235; Potassium 4.3; Sodium 137    Lipid Panel Lab Results  Component Value Date   CHOL 144 12/30/2012   HDL 58 12/30/2012   LDLCALC 60 12/30/2012   TRIG 128 12/30/2012      Wt Readings from Last 3 Encounters:  10/09/15 144 lb (65.3 kg)  09/09/15 129 lb 9.6 oz (58.8 kg)  08/05/15 137 lb (62.1 kg)       ASSESSMENT AND PLAN:  Elevated LFTs - Plan: Hepatic function panel Minimal elevation of ALT We will repeat today. If back to normal, will restart her statin given severe coronary artery disease. Family wanting to restart simvastatin  Hyperlipidemia As above, will do lab work first before restarting her simvastatin  Essential hypertension Family concerned about elevated blood pressure, will write [facility] before making any changes  Atherosclerosis of coronary artery bypass graft with unstable angina pectoris, unspecified whether native or transplanted heart [facility](HCC) Currently with no symptoms of angina. No further workup at this time. Continue current medication regimen.  Ankle edema Minimal ankle swelling. Would recommend we continue torsemide every other day Potentially could  take additional torsemide as needed Recommended she call our office if swelling gets worse She does have high fluid intake, suggested she moderate water intake  Disposition:   F/U  6 months   Total encounter time more than 25 minutes  Greater than 50% was spent in counseling and coordination of care with the patient    Orders Placed This Encounter  Procedures  . Hepatic function panel     Signed, Dossie Arbourim Quinterius Gaida, M.D., Ph.D. 10/09/2015  [facility], [place] [phone]

## 2015-10-09 NOTE — Patient Instructions (Signed)
Medication Instructions:   No medication changes made  Please check blood pressures 3 times a week for one month  Labwork:  We will check LFTs today  Testing/Procedures:  No further testing at this time   Follow-Up: It was a pleasure seeing you in the office today. Please call us if you have new issues that need to be addressed before your next appt.  [phone]  Your physician wants you to follow-up in: 6 months.  You will receive a reminder letter in the mail two months in advance. If you don't receive a letter, please call our office to schedule the follow-up appointment.  If you need a refill on your cardiac medications before your next appointment, please call your pharmacy.

## 2015-10-10 LAB — HEPATIC FUNCTION PANEL
ALT: 16 IU/L (ref 0–32)
AST: 27 IU/L (ref 0–40)
Albumin: 4.6 g/dL (ref 3.2–4.6)
Alkaline Phosphatase: 105 IU/L (ref 39–117)
Bilirubin Total: 0.2 mg/dL (ref 0.0–1.2)
Bilirubin, Direct: 0.08 mg/dL (ref 0.00–0.40)
TOTAL PROTEIN: 7.2 g/dL (ref 6.0–8.5)

## 2015-10-16 ENCOUNTER — Other Ambulatory Visit: Payer: Self-pay

## 2015-10-16 MED ORDER — SIMVASTATIN 40 MG PO TABS: 40.0000 mg | ORAL_TABLET | Freq: Every day | ORAL | 3 refills | Status: DC

## 2015-10-30 DIAGNOSIS — E7800 Pure hypercholesterolemia, unspecified: Secondary | ICD-10-CM | POA: Diagnosis not present

## 2015-10-30 DIAGNOSIS — I2510 Atherosclerotic heart disease of native coronary artery without angina pectoris: Secondary | ICD-10-CM | POA: Diagnosis not present

## 2015-10-30 DIAGNOSIS — Z79899 Other long term (current) drug therapy: Secondary | ICD-10-CM | POA: Diagnosis not present

## 2015-10-30 DIAGNOSIS — Z23 Encounter for immunization: Secondary | ICD-10-CM | POA: Diagnosis not present

## 2015-10-30 DIAGNOSIS — E039 Hypothyroidism, unspecified: Secondary | ICD-10-CM | POA: Diagnosis not present

## 2015-10-30 DIAGNOSIS — I10 Essential (primary) hypertension: Secondary | ICD-10-CM | POA: Diagnosis not present

## 2015-11-05 ENCOUNTER — Encounter: Payer: Self-pay | Admitting: Urology

## 2015-11-05 ENCOUNTER — Ambulatory Visit (INDEPENDENT_AMBULATORY_CARE_PROVIDER_SITE_OTHER): Payer: Commercial Managed Care - HMO | Admitting: Urology

## 2015-11-05 VITALS — BP 128/77 | HR 62 | Ht 63.0 in | Wt 145.0 lb

## 2015-11-05 DIAGNOSIS — N390 Urinary tract infection, site not specified: Secondary | ICD-10-CM | POA: Diagnosis not present

## 2015-11-05 DIAGNOSIS — N952 Postmenopausal atrophic vaginitis: Secondary | ICD-10-CM | POA: Diagnosis not present

## 2015-11-05 DIAGNOSIS — R32 Unspecified urinary incontinence: Secondary | ICD-10-CM

## 2015-11-05 DIAGNOSIS — N3281 Overactive bladder: Secondary | ICD-10-CM | POA: Diagnosis not present

## 2015-11-05 LAB — URINALYSIS, COMPLETE
BILIRUBIN UA: NEGATIVE
GLUCOSE, UA: NEGATIVE
Ketones, UA: NEGATIVE
Nitrite, UA: NEGATIVE
PROTEIN UA: NEGATIVE
RBC UA: NEGATIVE
Specific Gravity, UA: <1.005 — ABNORMAL LOW (ref 1.005–1.030)
UUROB: 0.2 mg/dL (ref 0.2–1.0)
pH, UA: 5.5 (ref 5.0–7.5)

## 2015-11-05 LAB — MICROSCOPIC EXAMINATION: RBC, UA: NONE SEEN /hpf (ref 0–?)

## 2015-11-05 NOTE — Progress Notes (Signed)
11:19 AM   [name] 04-27-1922 [id number]  Referring provider: Marguarite Arbour, MD [address] [facility] [place], [place] [address]  Chief Complaint  Patient presents with  . Over Active Bladder    Follow up    HPI: Patient is a 82 year old Caucasian female with a history of recurrent urinary tract infections, atrophic vaginitis and incontinence who presents today for a 3 month follow-up.  She presents with her daughter for today's visit. She is currently experiencing urinary frequency, urgency, nocturia and incontinence. Her daughter is also wondering about the possibility of her having a urinary tract infection.    Patient does not endorse dysuria, suprapubic pain or gross hematuria. She also does not endorse fevers, chills, nausea or vomiting.  Her daughter states that she has noticed some confusion, but the [facility] provided by Dionne Milo noted no changes in her mental status over the last month.  She is currently taking Myrbetriq 50 mg daily.   She is also having a vaginal estrogen cream applied 3 nights weekly.  She is taking cranberry tablets.  Her last documented urinary tract infection was on 07/24/2015.  It was positive for Escherichia coli with a variable resistance pattern and she has completed a course of Macrobid.    Her baseline urinary symptoms consist of SUI for which she wears depends, nocturia x 1 and hesitancy.    Her UA today noted a 11-30 WBC's/hpf, a few bacteria and nitrite negative.     PMH: Past Medical History:  Diagnosis Date  . Anemia   . Chronic kidney disease    Stage 3  . CKD (chronic kidney disease), stage III   . GERD (gastroesophageal reflux disease)   . Heart disease   . History of stomach ulcers   . Hyperlipidemia   . Hypertension   . Hypothyroidism   . Lymphedema   . OA (osteoarthritis)   . Pneumonia   . Recurrent UTI     Surgical History: Past Surgical History:  Procedure Laterality Date  .  ABDOMINAL HYSTERECTOMY    . CHOLECYSTECTOMY    . CORONARY ARTERY BYPASS GRAFT      Home Medications:    Medication List       Accurate as of 11/05/15 11:19 AM. Always use your most recent med list.          ALPRAZolam 1 MG tablet Commonly known as:  XANAX Take 1 mg by mouth at bedtime. For anxiety   aspirin 81 MG EC tablet Take 81 mg by mouth daily.   conjugated estrogens vaginal cream Commonly known as:  PREMARIN Place 1 Applicatorful vaginally daily. Apply 0.5mg  (pea-sized amount)  just inside the vaginal introitus with a finger-tip every night for two weeks and then Monday, Wednesday and Friday nights.   Cranberry 500 MG Chew Chew 1 tablet by mouth daily.   docusate sodium 100 MG capsule Commonly known as:  COLACE Take 100 mg by mouth daily. Reported on 05/02/2015   estradiol 0.1 MG/GM vaginal cream Commonly known as:  ESTRACE VAGINAL Apply 0.5mg  (pea-sized amount)  just inside the vaginal introitus with a finger-tip every night for two weeks and then Monday, Wednesday and Friday nights.   gabapentin 100 MG capsule Commonly known as:  NEURONTIN Take 100 mg by mouth 3 (three) times daily.   HYDROcodone-acetaminophen 10-325 MG tablet Commonly known as:  NORCO Take 1 tablet by mouth every 4 (four) hours as needed for moderate pain. Reported on 08/05/2015  KLOR-CON M20 20 MEQ tablet Generic drug:  potassium chloride SA Take 20 mEq by mouth 3 (three) times a week. Reported on 04/04/2015   levothyroxine 75 MCG tablet Commonly known as:  SYNTHROID, LEVOTHROID   loperamide 2 MG capsule Commonly known as:  IMODIUM Take 2 mg by mouth as needed for diarrhea or loose stools. Reported on 05/02/2015   metoprolol succinate 50 MG 24 hr tablet Commonly known as:  TOPROL-XL Take 50 mg by mouth daily.   mirabegron ER 50 MG Tb24 tablet Commonly known as:  MYRBETRIQ Take 1 tablet (50 mg total) by mouth daily.   mirtazapine 7.5 MG tablet Commonly known as:  REMERON Take 7.5  mg by mouth at bedtime.   neomycin-bacitracin-polymyxin [phone] ointment Apply 1 application topically as needed (skin tears, abrasions, or minor irritations). Reported on 04/04/2015   omeprazole 20 MG capsule Commonly known as:  PRILOSEC Take 20 mg by mouth daily. Reported on 04/16/2015   pantoprazole 40 MG tablet Commonly known as:  PROTONIX Take 40 mg by mouth daily.   simvastatin 40 MG tablet Commonly known as:  ZOCOR Take 1 tablet (40 mg total) by mouth at bedtime.   sucralfate 1 g tablet Commonly known as:  CARAFATE Take 1 g by mouth daily.   torsemide 10 MG tablet Commonly known as:  DEMADEX Take 10 mg by mouth daily.   vitamin B-12 1000 MCG tablet Commonly known as:  CYANOCOBALAMIN Take 1,000 mcg by mouth every morning.       Allergies:  Allergies  Allergen Reactions  . Augmentin [Amoxicillin-Pot Clavulanate] Diarrhea    Pt c/o severe stomach pain with diarrhea   . Iodine     Rash/hives  . Tetanus Toxoids Other (See Comments)    From nursing home MAR...side effects unknown    Family History: Family History  Problem Relation Age of Onset  . Kidney disease Neg Hx   . Bladder Cancer Neg Hx     Social History:  reports that she has never smoked. She has never used smokeless tobacco. She reports that she does not drink alcohol or use drugs.  ROS: UROLOGY Frequent Urination?: Yes Hard to postpone urination?: Yes Burning/pain with urination?: No Get up at night to urinate?: Yes Leakage of urine?: Yes Urine stream starts and stops?: No Trouble starting stream?: No Do you have to strain to urinate?: No Blood in urine?: No Urinary tract infection?: Yes Sexually transmitted disease?: No Injury to kidneys or bladder?: No Painful intercourse?: No Weak stream?: No Currently pregnant?: No Vaginal bleeding?: No Last menstrual period?: n  Gastrointestinal Nausea?: No Vomiting?: No Indigestion/heartburn?: No Diarrhea?: No Constipation?:  No  Constitutional Fever: No Night sweats?: No Weight loss?: No Fatigue?: No  Skin Skin rash/lesions?: No Itching?: No  Eyes Blurred vision?: No Double vision?: No  Ears/Nose/Throat Sore throat?: No Sinus problems?: No  Hematologic/Lymphatic Swollen glands?: No Easy bruising?: No  Cardiovascular Leg swelling?: No Chest pain?: No  Respiratory Cough?: No Shortness of breath?: No  Endocrine Excessive thirst?: No  Musculoskeletal Back pain?: No Joint pain?: No  Neurological Headaches?: No Dizziness?: No  Psychologic Depression?: No Anxiety?: No  Physical Exam: BP 128/77 (BP Location: Left Arm, Patient Position: Sitting, Cuff Size: Normal)   Pulse 62   Ht 5\' 3"  (1.6 m)   Wt 145 lb (65.8 kg)   BMI 25.69 kg/m   Constitutional: Well nourished. Alert and oriented, No acute distress. HEENT: [place] AT, moist mucus membranes. Trachea midline, no masses. Cardiovascular: No clubbing,  cyanosis, or edema. Respiratory: Normal respiratory effort, no increased work of breathing. GI: Abdomen is soft, non tender, non distended, no abdominal masses. Liver and spleen not palpable.  No hernias appreciated.  Stool sample for occult testing is not indicated.   GU: No CVA tenderness.  Atrophic external genitalia.   Neurologic: Grossly intact, no focal deficits, moving all 4 extremities. Psychiatric: Normal mood and affect.  Laboratory Data: Lab Results  Component Value Date   WBC 5.9 11/04/2014   HGB 10.6 (L) 11/04/2014   HCT 31.6 (L) 11/04/2014   MCV 89.9 11/04/2014   PLT 235 11/04/2014   Lab Results  Component Value Date   CREATININE 1.28 (H) 11/04/2014   Lab Results  Component Value Date   TSH 1.50 02/22/2014      Component Value Date/Time   CHOL 144 12/30/2012 0521   HDL 58 12/30/2012 0521   CHOLHDL 2.2 Ratio 04/22/2010 2115   VLDL 26 12/30/2012 0521   LDLCALC 60 12/30/2012 0521    Lab Results  Component Value Date   AST 27 10/09/2015   Lab Results   Component Value Date   ALT 16 10/09/2015   + Pseudomonas aeruginosa on 04/16/2015 + E. Coli on 05/02/2015 + Klebsiella pneumoniae on 06/14/2015 + Klebsiella pneumoniae on 07/09/2015 + E. Coli on 07/24/2015   Assessment & Plan:    1. Recurrent UTI  - reviewed UTI prevention  - reminded daughter and patient that she should have CATH UA's to culture for possible infections  - reviewed UTI symptoms  - discussed antibiotic stewardship  - patient's daughter is concerned that she may have an infection due to her observed mental status changes - SNIF has not indicated changes in mental status  - urine sent for culture                                   2. Incontinence  - Patient will continue her Myrbetriq to 50 mg daily  3. Atrophic vaginitis  - Patient will continue to apply the vaginal estrogen cream on Monday, Wednesday and Friday nights  Return for pending urine culture.  These notes generated with voice recognition software. I apologize for typographical errors.  Michiel CowboySHANNON Tarina Volk, PA-C  [facility] [address], [address] [place], [place] [address] [phone]

## 2015-11-08 ENCOUNTER — Telehealth: Payer: Self-pay | Admitting: Urology

## 2015-11-08 NOTE — Telephone Encounter (Signed)
Patient's urine did not get sent for urine culture.  We will have to CATH her again for culture.

## 2015-11-08 NOTE — Telephone Encounter (Signed)
Patient's daughter, Eunice BlaseDebbie, called regarding the patient's urine culture results.  Please call her.

## 2015-11-08 NOTE — Telephone Encounter (Signed)
Spoke with pt daughter, Eunice BlaseDebbie, in reference to ucx for pt. Made [name] aware pt urine was not sent for cx. Eunice BlaseDebbie was confused as to why. Apologized to [employer]me[employer] CorporationDebbie. Made aware if pt is not symptomatic no need to treat "dirty urine". But she is having n/v, f/c, altered mental status, dysuria, lower abd pain then we can do a cath specimen. [name] elected to see how the weekend goes for pt and call back Monday.

## 2015-11-12 ENCOUNTER — Ambulatory Visit (INDEPENDENT_AMBULATORY_CARE_PROVIDER_SITE_OTHER): Payer: Commercial Managed Care - HMO

## 2015-11-12 VITALS — BP 148/76 | HR 65 | Temp 97.4°F | Wt 145.1 lb

## 2015-11-12 DIAGNOSIS — N390 Urinary tract infection, site not specified: Secondary | ICD-10-CM | POA: Diagnosis not present

## 2015-11-12 LAB — MICROSCOPIC EXAMINATION

## 2015-11-12 LAB — URINALYSIS, COMPLETE
Bilirubin, UA: NEGATIVE
GLUCOSE, UA: NEGATIVE
Ketones, UA: NEGATIVE
NITRITE UA: NEGATIVE
Protein, UA: NEGATIVE
RBC, UA: NEGATIVE
Specific Gravity, UA: <1.005 — ABNORMAL LOW (ref 1.005–1.030)
UUROB: 0.2 mg/dL (ref 0.2–1.0)
pH, UA: 6 (ref 5.0–7.5)

## 2015-11-12 NOTE — Progress Notes (Addendum)
In and Out Catheterization  Patient is present today for a I & O catheterization due to possible UTI. Patient was cleaned and prepped in a sterile fashion with betadine and Lidocaine 2% jelly was instilled into the urethra.  A 14FR cath was inserted no complications were noted , 100ml of urine return was noted, urine was yellow in color. A clean urine sample was collected for u/a and cx. Bladder was drained  And catheter was removed with out difficulty.    Preformed by: Rupert Stackshelsea Watkins, LPN   Blood pressure (!) 148/76, pulse 65, temperature 97.4 F (36.3 C), weight 145 lb 1.6 oz (65.8 kg).

## 2015-11-14 ENCOUNTER — Telehealth: Payer: Self-pay | Admitting: Urology

## 2015-11-14 NOTE — Telephone Encounter (Signed)
Patient's daughter wants her results  Call her at [phone]

## 2015-11-15 ENCOUNTER — Telehealth: Payer: Self-pay

## 2015-11-15 DIAGNOSIS — N390 Urinary tract infection, site not specified: Secondary | ICD-10-CM

## 2015-11-15 LAB — CULTURE, URINE COMPREHENSIVE

## 2015-11-15 MED ORDER — SULFAMETHOXAZOLE-TRIMETHOPRIM 800-160 MG PO TABS: 1.0000 | ORAL_TABLET | Freq: Two times a day (BID) | ORAL | 0 refills | Status: AC

## 2015-11-15 NOTE — Telephone Encounter (Signed)
LMOM- abx sent to pharmacy. Faxed orders to facility for pt.

## 2015-11-15 NOTE — Telephone Encounter (Signed)
Please advise 

## 2015-11-15 NOTE — Telephone Encounter (Signed)
-----   Message from Harle BattiestShannon A McGowan, PA-C sent at 11/15/2015 11:37 AM EDT ----- Please have patient start Septra DS, twice daily for seven days.

## 2015-11-19 DIAGNOSIS — J209 Acute bronchitis, unspecified: Secondary | ICD-10-CM | POA: Diagnosis not present

## 2015-12-11 DIAGNOSIS — B351 Tinea unguium: Secondary | ICD-10-CM | POA: Diagnosis not present

## 2015-12-11 DIAGNOSIS — M79674 Pain in right toe(s): Secondary | ICD-10-CM | POA: Diagnosis not present

## 2015-12-11 DIAGNOSIS — M79675 Pain in left toe(s): Secondary | ICD-10-CM | POA: Diagnosis not present

## 2015-12-20 ENCOUNTER — Telehealth: Payer: Self-pay | Admitting: Cardiovascular Disease

## 2015-12-20 NOTE — Telephone Encounter (Signed)
Spoke w/ Eunice Blaseebbie.  Advised her that pt's simvastatin was restarted after labs 10/16/15. She is appreciative and will call back w/ any questions or concerns.

## 2015-12-20 NOTE — Telephone Encounter (Signed)
Pt has a question regarding pt Simvastatin. She is not sure if pt is supposed to be taking this or not. Please call and advise.

## 2015-12-30 ENCOUNTER — Other Ambulatory Visit: Payer: Self-pay | Admitting: Urology

## 2015-12-30 DIAGNOSIS — N3281 Overactive bladder: Secondary | ICD-10-CM

## 2016-01-06 ENCOUNTER — Telehealth: Payer: Self-pay

## 2016-01-06 NOTE — Telephone Encounter (Signed)
That would be fine to call in the compounded estrogen cream.

## 2016-01-06 NOTE — Telephone Encounter (Signed)
Estradiol called into pharmacy. Spoke with pt daughter, Eunice BlaseDebbie, and made aware medications was called into [facility] pharmacy. [name] voiced understanding.

## 2016-01-06 NOTE — Telephone Encounter (Signed)
Pt daughter called stating pt is running out of estrace cream. Daughter inquired about any other options. Made daughter aware of compounded medication. Please advise.

## 2016-01-23 ENCOUNTER — Telehealth: Payer: Self-pay

## 2016-01-23 DIAGNOSIS — I10 Essential (primary) hypertension: Secondary | ICD-10-CM | POA: Diagnosis not present

## 2016-01-23 DIAGNOSIS — R8290 Unspecified abnormal findings in urine: Secondary | ICD-10-CM | POA: Diagnosis not present

## 2016-01-23 DIAGNOSIS — Z79899 Other long term (current) drug therapy: Secondary | ICD-10-CM | POA: Diagnosis not present

## 2016-01-23 DIAGNOSIS — E7800 Pure hypercholesterolemia, unspecified: Secondary | ICD-10-CM | POA: Diagnosis not present

## 2016-01-23 DIAGNOSIS — E039 Hypothyroidism, unspecified: Secondary | ICD-10-CM | POA: Diagnosis not present

## 2016-01-23 NOTE — Telephone Encounter (Signed)
Pt daughter stated that pt went to see Dr. Judithann SheenSparks today for her labs. [name] stated that Dr. Judithann SheenSparks did a u/a and pt was nitrite positive and many bacteria. Eunice BlaseDebbie also stated this was a clean catch urine and they wouldn't know she had an "infection" if he had not requested a urine. Reinforced with Eunice BlaseDebbie [facility]. Therefore Eunice BlaseDebbie would need to ask Dr. Judithann SheenSparks if he is going to treat the "infection". [name] voiced understanding of whole conversation.

## 2016-01-29 DIAGNOSIS — Z0000 Encounter for general adult medical examination without abnormal findings: Secondary | ICD-10-CM | POA: Diagnosis not present

## 2016-01-29 DIAGNOSIS — I10 Essential (primary) hypertension: Secondary | ICD-10-CM | POA: Diagnosis not present

## 2016-01-29 DIAGNOSIS — Z87440 Personal history of urinary (tract) infections: Secondary | ICD-10-CM | POA: Diagnosis not present

## 2016-01-29 DIAGNOSIS — E039 Hypothyroidism, unspecified: Secondary | ICD-10-CM | POA: Diagnosis not present

## 2016-01-30 ENCOUNTER — Encounter: Payer: Self-pay | Admitting: Emergency Medicine

## 2016-01-30 ENCOUNTER — Emergency Department: Payer: Commercial Managed Care - HMO

## 2016-01-30 ENCOUNTER — Emergency Department
Admission: EM | Admit: 2016-01-30 | Discharge: 2016-01-30 | Disposition: A | Discharge status: Skilled Nursing Facility | Payer: Commercial Managed Care - HMO | Attending: Emergency Medicine | Admitting: Emergency Medicine

## 2016-01-30 DIAGNOSIS — F419 Anxiety disorder, unspecified: Secondary | ICD-10-CM | POA: Diagnosis not present

## 2016-01-30 DIAGNOSIS — W19XXXA Unspecified fall, initial encounter: Secondary | ICD-10-CM

## 2016-01-30 DIAGNOSIS — Z79899 Other long term (current) drug therapy: Secondary | ICD-10-CM | POA: Diagnosis not present

## 2016-01-30 DIAGNOSIS — S43015A Anterior dislocation of left humerus, initial encounter: Secondary | ICD-10-CM | POA: Diagnosis not present

## 2016-01-30 DIAGNOSIS — S43005A Unspecified dislocation of left shoulder joint, initial encounter: Secondary | ICD-10-CM

## 2016-01-30 DIAGNOSIS — E039 Hypothyroidism, unspecified: Secondary | ICD-10-CM | POA: Diagnosis not present

## 2016-01-30 DIAGNOSIS — E785 Hyperlipidemia, unspecified: Secondary | ICD-10-CM | POA: Diagnosis not present

## 2016-01-30 DIAGNOSIS — S0990XA Unspecified injury of head, initial encounter: Secondary | ICD-10-CM | POA: Diagnosis not present

## 2016-01-30 DIAGNOSIS — N183 Chronic kidney disease, stage 3 (moderate): Secondary | ICD-10-CM | POA: Diagnosis not present

## 2016-01-30 DIAGNOSIS — Z7982 Long term (current) use of aspirin: Secondary | ICD-10-CM | POA: Diagnosis not present

## 2016-01-30 DIAGNOSIS — W19XXXD Unspecified fall, subsequent encounter: Secondary | ICD-10-CM | POA: Diagnosis not present

## 2016-01-30 DIAGNOSIS — Y9389 Activity, other specified: Secondary | ICD-10-CM | POA: Insufficient documentation

## 2016-01-30 DIAGNOSIS — W1809XA Striking against other object with subsequent fall, initial encounter: Secondary | ICD-10-CM | POA: Diagnosis not present

## 2016-01-30 DIAGNOSIS — Y929 Unspecified place or not applicable: Secondary | ICD-10-CM | POA: Diagnosis not present

## 2016-01-30 DIAGNOSIS — I129 Hypertensive chronic kidney disease with stage 1 through stage 4 chronic kidney disease, or unspecified chronic kidney disease: Secondary | ICD-10-CM | POA: Insufficient documentation

## 2016-01-30 DIAGNOSIS — I10 Essential (primary) hypertension: Secondary | ICD-10-CM | POA: Diagnosis not present

## 2016-01-30 DIAGNOSIS — N3281 Overactive bladder: Secondary | ICD-10-CM | POA: Diagnosis not present

## 2016-01-30 DIAGNOSIS — S3993XA Unspecified injury of pelvis, initial encounter: Secondary | ICD-10-CM | POA: Diagnosis not present

## 2016-01-30 DIAGNOSIS — Y999 Unspecified external cause status: Secondary | ICD-10-CM | POA: Diagnosis not present

## 2016-01-30 DIAGNOSIS — S0181XA Laceration without foreign body of other part of head, initial encounter: Secondary | ICD-10-CM | POA: Insufficient documentation

## 2016-01-30 DIAGNOSIS — S299XXA Unspecified injury of thorax, initial encounter: Secondary | ICD-10-CM | POA: Diagnosis not present

## 2016-01-30 DIAGNOSIS — R262 Difficulty in walking, not elsewhere classified: Secondary | ICD-10-CM

## 2016-01-30 DIAGNOSIS — G629 Polyneuropathy, unspecified: Secondary | ICD-10-CM | POA: Diagnosis not present

## 2016-01-30 DIAGNOSIS — S43005D Unspecified dislocation of left shoulder joint, subsequent encounter: Secondary | ICD-10-CM | POA: Diagnosis not present

## 2016-01-30 DIAGNOSIS — Z4789 Encounter for other orthopedic aftercare: Secondary | ICD-10-CM | POA: Diagnosis not present

## 2016-01-30 DIAGNOSIS — S0990XD Unspecified injury of head, subsequent encounter: Secondary | ICD-10-CM | POA: Diagnosis not present

## 2016-01-30 DIAGNOSIS — S4992XA Unspecified injury of left shoulder and upper arm, initial encounter: Secondary | ICD-10-CM | POA: Diagnosis present

## 2016-01-30 LAB — CBC WITH DIFFERENTIAL/PLATELET
BASOS PCT: 1 %
Basophils Absolute: 0.1 10*3/uL (ref 0–0.1)
EOS ABS: 0.3 10*3/uL (ref 0–0.7)
Eosinophils Relative: 3 %
HEMATOCRIT: 33.7 % — AB (ref 35.0–47.0)
Hemoglobin: 11.8 g/dL — ABNORMAL LOW (ref 12.0–16.0)
LYMPHS ABS: 3 10*3/uL (ref 1.0–3.6)
Lymphocytes Relative: 28 %
MCH: 31.9 pg (ref 26.0–34.0)
MCHC: 35.1 g/dL (ref 32.0–36.0)
MCV: 91.1 fL (ref 80.0–100.0)
MONO ABS: 1.2 10*3/uL — AB (ref 0.2–0.9)
MONOS PCT: 11 %
NEUTROS ABS: 6.2 10*3/uL (ref 1.4–6.5)
Neutrophils Relative %: 57 %
Platelets: 207 10*3/uL (ref 150–440)
RBC: 3.7 MIL/uL — ABNORMAL LOW (ref 3.80–5.20)
RDW: 14.4 % (ref 11.5–14.5)
WBC: 10.9 10*3/uL (ref 3.6–11.0)

## 2016-01-30 LAB — BASIC METABOLIC PANEL
Anion gap: 7 (ref 5–15)
BUN: 17 mg/dL (ref 6–20)
CALCIUM: 8.6 mg/dL — AB (ref 8.9–10.3)
CHLORIDE: 109 mmol/L (ref 101–111)
CO2: 22 mmol/L (ref 22–32)
CREATININE: 1.13 mg/dL — AB (ref 0.44–1.00)
GFR calc non Af Amer: 41 mL/min — ABNORMAL LOW (ref >60)
GFR, EST AFRICAN AMERICAN: 48 mL/min — AB (ref >60)
Glucose, Bld: 132 mg/dL — ABNORMAL HIGH (ref 65–99)
Potassium: 3.9 mmol/L (ref 3.5–5.1)
Sodium: 138 mmol/L (ref 135–145)

## 2016-01-30 LAB — TROPONIN I: Troponin I: <0.03 ng/mL (ref <0.03)

## 2016-01-30 MED ORDER — HYDROCODONE-ACETAMINOPHEN 5-325 MG PO TABS: 1.0000 | ORAL_TABLET | Freq: Four times a day (QID) | ORAL | 0 refills | Status: DC | PRN

## 2016-01-30 MED ORDER — BUPIVACAINE HCL (PF) 0.5 % IJ SOLN
INTRAMUSCULAR | Status: AC
  Administered 2016-01-30: 30 mL
  Filled 2016-01-30: qty 30

## 2016-01-30 MED ORDER — MORPHINE SULFATE (PF) 4 MG/ML IV SOLN
2.0000 mg | Freq: Once | INTRAVENOUS | Status: AC
  Administered 2016-01-30: 2 mg via INTRAVENOUS

## 2016-01-30 MED ORDER — FENTANYL CITRATE (PF) 100 MCG/2ML IJ SOLN
50.0000 ug | Freq: Once | INTRAMUSCULAR | Status: AC
Start: 2016-01-30 — End: 2016-01-30
  Administered 2016-01-30: 50 ug via INTRAVENOUS

## 2016-01-30 MED ORDER — ALPRAZOLAM 1 MG PO TABS: 1.0000 mg | ORAL_TABLET | Freq: Every day | ORAL | 0 refills | Status: AC

## 2016-01-30 MED ORDER — ONDANSETRON HCL 4 MG/2ML IJ SOLN
4.0000 mg | Freq: Once | INTRAMUSCULAR | Status: AC
  Administered 2016-01-30: 4 mg via INTRAVENOUS

## 2016-01-30 MED ORDER — BUPIVACAINE HCL (PF) 0.5 % IJ SOLN
30.0000 mL | Freq: Once | INTRAMUSCULAR | Status: AC
  Administered 2016-01-30: 30 mL

## 2016-01-30 MED ORDER — MORPHINE SULFATE (PF) 4 MG/ML IV SOLN
INTRAVENOUS | Status: AC
  Administered 2016-01-30: 2 mg via INTRAVENOUS
  Filled 2016-01-30: qty 1

## 2016-01-30 MED ORDER — FENTANYL CITRATE (PF) 100 MCG/2ML IJ SOLN
INTRAMUSCULAR | Status: AC
  Administered 2016-01-30: 50 ug via INTRAVENOUS
  Filled 2016-01-30: qty 2

## 2016-01-30 MED ORDER — ONDANSETRON HCL 4 MG/2ML IJ SOLN
INTRAMUSCULAR | Status: AC
  Administered 2016-01-30: 4 mg via INTRAVENOUS
  Filled 2016-01-30: qty 2

## 2016-01-30 NOTE — ED Notes (Signed)
Pt resting comfortably in bed with eyes closed. Respirations even and unlabored, skin warm and dry. Will continue to monitor.

## 2016-01-30 NOTE — Evaluation (Signed)
Physical Therapy Evaluation Patient Details Name: [name] MRN: [id number] DOB: Feb 24, 1922 Today's Date: 01/30/2016   History of Present Illness  presented to ER status post fall at ALF, sustaining forehead laceration and L anterior shoulder dislocation, status post closed reduction (now immobilized in sling and NWB).  Clinical Impression  Patient alert and oriented; follows all commands and demonstrates good insight into medical condition.  Patient with significant balance deficits noted during all mobility trials with unilateral AD (due to NWB status L UE), requiring hands-on/min assist +1 for optimal safety with all functional mobility.  Patient very unsteady with increased sway in all planes; high risk for recurrent falls without 24/7 physical assist at this time. Patient, however, very eager and motivated to progress and return to ALF; anticipate good progress towards functional indep with continued therapy services. Would benefit from skilled PT to address above deficits and promote optimal return to PLOF; recommend transition to STR upon discharge from acute hospitalization.     Follow Up Recommendations SNF    Equipment Recommendations       Recommendations for Other Services       Precautions / Restrictions Precautions Precautions: Fall Required Braces or Orthoses:  (L shoulder immobilizer) Restrictions Weight Bearing Restrictions: Yes LUE Weight Bearing: Non weight bearing      Mobility  Bed Mobility Overal bed mobility: Needs Assistance Bed Mobility: Supine to Sit     Supine to sit: Min assist     General bed mobility comments: transition towards R  Transfers Overall transfer level: Needs assistance Equipment used: Hemi-walker Transfers: Sit to/from Stand Sit to Stand: Min assist            Ambulation/Gait Ambulation/Gait assistance: Min assist Ambulation Distance (Feet): 5 Feet Assistive device: Hemi-walker       General Gait Details:  3-point, step to gait pattern; assist required for negotiation of HW.  Generally unsteady and unsafe to attempt without hands-on assist at all times.  PAtient very fearful/uncomfortable with use of HW  Stairs            Wheelchair Mobility    Modified Rankin (Stroke Patients Only)       Balance Overall balance assessment: Needs assistance Sitting-balance support: No upper extremity supported;Feet supported Sitting balance-Leahy Scale: Good     Standing balance support: Single extremity supported Standing balance-Leahy Scale: Poor                               Pertinent Vitals/Pain Pain Assessment: Faces Faces Pain Scale: Hurts a little bit Pain Location: L shoulder Pain Descriptors / Indicators: Sore Pain Intervention(s): Limited activity within patient's tolerance;Monitored during session;Repositioned    Home Living Family/patient expects to be discharged to:: Assisted living               Home Equipment: Dan HumphreysWalker - 4 wheels Additional Comments: [facility]    Prior Function Level of Independence: Needs assistance         Comments: Assist from staff with ADLs as needed; mod indep for basic transfers and mobility within facility using 4WRW.  Ambulates to/from dining hall for all meals; endorses 1-2 falls in recent months.     Hand Dominance        Extremity/Trunk Assessment   Upper Extremity Assessment Upper Extremity Assessment:  (L UE immobilized; R UE grossly WFL)    Lower Extremity Assessment Lower Extremity Assessment: Overall WFL for  tasks assessed (grossly at least 4+/5 throughout)       Communication   Communication: No difficulties  Cognition Arousal/Alertness: Awake/alert Behavior During Therapy: WFL for tasks assessed/performed Overall Cognitive Status: Within Functional Limits for tasks assessed                      General Comments      Exercises Other Exercises Other  Exercises: 5' x2 with loftstrand, min assist-improved management/negotiation of loftstrand (vs HW), but continues to require +1 assist for stability.  Very short, shuffling steps; poor balance reaction.  High risk for recurrent falls with unilateral assist device in current state (though expect improvement with continued training)   Assessment/Plan    PT Assessment Patient needs continued PT services  PT Problem List Decreased strength;Decreased range of motion;Decreased activity tolerance;Decreased balance;Decreased mobility;Decreased coordination;Decreased knowledge of use of DME;Decreased safety awareness;Decreased knowledge of precautions;Obesity          PT Treatment Interventions DME instruction;Gait training;Functional mobility training;Therapeutic activities;Therapeutic exercise;Balance training;Patient/family education    PT Goals (Current goals can be found in the Care Plan section)  Acute Rehab PT Goals Patient Stated Goal: [facility] Goal Formulation: With patient/family Time For Goal Achievement: 02/13/16 Potential to Achieve Goals: Good    Frequency Min 2X/week   Barriers to discharge Decreased caregiver support      Co-evaluation               End of Session Equipment Utilized During Treatment: Gait belt Activity Tolerance: Patient tolerated treatment well Patient left: in bed;with call bell/phone within reach;with family/visitor present Nurse Communication: Mobility status    Functional Assessment Tool Used: clinical judgement Functional Limitation: Mobility: Walking and moving around Mobility: Walking and Moving Around Current Status [phone](G8978): At least 40 percent but less than 60 percent impaired, limited or restricted Mobility: Walking and Moving Around Goal Status [phone](G8979): At least 1 percent but less than 20 percent impaired, limited or restricted    Time: 0844-0909 PT Time Calculation (min) (ACUTE ONLY): 25 min   Charges:   PT  Evaluation $PT Eval Moderate Complexity: 1 Procedure PT Treatments $Gait Training: 8-22 mins   PT G Codes:   PT G-Codes **NOT FOR INPATIENT CLASS** Functional Assessment Tool Used: clinical judgement Functional Limitation: Mobility: Walking and moving around Mobility: Walking and Moving Around Current Status ([id number](G8978): At least 40 percent but less than 60 percent impaired, limited or restricted Mobility: Walking and Moving Around Goal Status [phone](G8979): At least 1 percent but less than 20 percent impaired, limited or restricted    Aleisha Paone H. Manson PasseyBrown, PT, DPT, NCS 01/30/16, 9:33 AM [phone]

## 2016-01-30 NOTE — [place] FL2 (Signed)
[place] MEDICAID FL2 LEVEL OF CARE SCREENING TOOL     IDENTIFICATION  Patient Name: Erin CapeGeraldine Lao Birthdate: 03-28-1922 Sex: female Admission Date (Current Location): 01/30/2016  [facility] [place]   Facility and Address:  [facility], [facility], [address], [place], [place] [address]      Provider Number: [id number]  Attending Physician Name and Address:  No att. providers found  Relative Name and Phone Number:       Current Level of Care: Hospital Recommended Level of Care: Skilled Nursing Facility Prior Approval Number:    Date Approved/Denied:   PASRR Number:  [id number] )  Discharge Plan: SNF    Current Diagnoses: dislocation of LEFT shoulder  Patient Active Problem List   Diagnosis Date Noted  . ASCVD (arteriosclerotic cardiovascular disease) 08/05/2015  . Hiatal hernia 08/05/2015  . Gastric ulcer 08/05/2015  . Hypertension 08/05/2015  . Adult hypothyroidism 08/05/2015  . Osteoarthritis 08/05/2015  . Recurrent UTI 04/04/2015  . Atrophic vaginitis 04/04/2015  . Incontinence 04/04/2015  . Benign hypertension with CKD (chronic kidney disease) stage III 10/16/2014  . Stage III chronic kidney disease 10/16/2014  . Dehydration 10/16/2014  . Sepsis (HCC) 09/22/2014  . Hypoxia 09/22/2014  . Healthcare-associated pneumonia 09/22/2014  . ARF (acute renal failure) (HCC) 09/22/2014  . Lactic acid acidosis 09/22/2014  . Personal history of urinary infection 03/29/2014  . History of recurrent UTIs 03/29/2014  . Frequent falls 09/06/2013  . History of fall 09/06/2013  . Chronic insomnia 08/09/2013  . Ankle edema 09/30/2012  . Edema 09/30/2012  . CAD, ARTERY BYPASS GRAFT 06/21/2009  . HYPOTHYROIDISM 07/17/2008  . Hyperlipidemia 07/17/2008  . HTN (hypertension) 07/17/2008  . GERD (gastroesophageal reflux disease) 07/17/2008  . DYSPNEA 07/17/2008  . Chest pain 07/17/2008  . Essential hypertension 07/17/2008  . Other  and unspecified hyperlipidemia 07/17/2008    Orientation RESPIRATION BLADDER Height & Weight     Self, Time, Situation, Place  Normal Continent Weight: 148 lb (67.1 kg) Height:  5\' 2"  (157.5 cm)  BEHAVIORAL SYMPTOMS/MOOD NEUROLOGICAL BOWEL NUTRITION STATUS   (none)  (none) Continent Diet (Regular Diet )  AMBULATORY STATUS COMMUNICATION OF NEEDS Skin   Extensive Assist Verbally Bruising, Other (Comment) (dislocation of LEFT shoulder )                       Personal Care Assistance Level of Assistance  Bathing, Feeding, Dressing Bathing Assistance: Limited assistance Feeding assistance: Independent Dressing Assistance: Limited assistance     Functional Limitations Info  Sight, Hearing, Speech Sight Info: Adequate Hearing Info: Adequate Speech Info: Adequate    SPECIAL CARE FACTORS FREQUENCY  PT (By licensed PT), OT (By licensed OT)     PT Frequency:  (5) OT Frequency:  (5)            Contractures      Additional Factors Info  Allergies   Allergies Info:  (Augmentin Amoxicillin-pot Clavulanate, Iodine, Tetanus Toxoids)           Current Medications (01/30/2016):  This is the current hospital active medication list No current facility-administered medications for this encounter.    Current Outpatient Prescriptions  Medication Sig Dispense Refill  . ALPRAZolam (XANAX) 1 MG tablet Take 1 mg by mouth at bedtime. For anxiety    . aspirin 81 MG EC tablet Take 81 mg by mouth daily.      . Cranberry 500 MG CHEW Chew 1 tablet by mouth 2 (two) times daily.     .Marland Kitchen  estradiol (ESTRACE VAGINAL) 0.1 MG/GM vaginal cream Apply 0.5mg  (pea-sized amount)  just inside the vaginal introitus with a finger-tip every night for two weeks and then Monday, Wednesday and Friday nights. 30 g 12  . gabapentin (NEURONTIN) 100 MG capsule Take 100 mg by mouth 3 (three) times daily.     Marland Kitchen. levothyroxine (SYNTHROID, LEVOTHROID) 75 MCG tablet     . metoprolol succinate (TOPROL-XL) 50 MG 24 hr  tablet Take 50 mg by mouth daily.    . mirtazapine (REMERON) 7.5 MG tablet Take 7.5 mg by mouth at bedtime.    Marland Kitchen. MYRBETRIQ 50 MG TB24 tablet TAKE 1 TABLET BY MOUTH EVERY DAY (Patient taking differently: 1 tab qd) 30 tablet 3  . pantoprazole (PROTONIX) 40 MG tablet Take 40 mg by mouth daily.    . simvastatin (ZOCOR) 40 MG tablet Take 1 tablet (40 mg total) by mouth at bedtime. (Patient taking differently: Take 40 mg by mouth daily. ) 90 tablet 3  . sucralfate (CARAFATE) 1 G tablet Take 1 g by mouth daily.     . vitamin B-12 (CYANOCOBALAMIN) 1000 MCG tablet Take 1,000 mcg by mouth every morning.     . conjugated estrogens (PREMARIN) vaginal cream Place 1 Applicatorful vaginally daily. Apply 0.5mg  (pea-sized amount)  just inside the vaginal introitus with a finger-tip every night for two weeks and then Monday, Wednesday and Friday nights. (Patient not taking: Reported on 01/30/2016) 30 g 12  . HYDROcodone-acetaminophen (NORCO) 5-325 MG tablet Take 1 tablet by mouth every 6 (six) hours as needed for moderate pain. 20 tablet 0     Discharge Medications: Please see discharge summary for a list of discharge medications.  Relevant Imaging Results:  Relevant Lab Results:   Additional Information  (SSN: [id number])  [name], [name], LCSW

## 2016-01-30 NOTE — Discharge Instructions (Signed)
1. Suture removal in 5 days. 2. You may take pain medicine as needed (Norco #20). 3. Keep shoulder immobilizer until seen by the orthopedic doctor. 4. Return to the ER for worsening symptoms, persistent vomiting, difficulty breathing or other concerns.

## 2016-01-30 NOTE — ED Notes (Signed)
Steri-strips applied to forehead. Family at bedside.

## 2016-01-30 NOTE — ED Notes (Signed)
Spoke with patient's daughter regarding rehab placement. Fredric MareBailey with social work has been in contact with daughter and says she will be placed at [name] pending bed assignment. Daughter reports she will be able to transport patient to facility.

## 2016-01-30 NOTE — ED Triage Notes (Signed)
Pt presents [facility] ED [facility] c/o laceration to forehead and possible dislocation of LEFT shoulder due to fall. Per EMS pt attempted to get walker to ambulate to bathroom and fell, hit forehead on TV stand. EMS gave pt 100 mcg PTA. Pt alert and oriented upon arrival, skin warm and dry.

## 2016-01-30 NOTE — ED Notes (Signed)
Forehead laceration cleaned with normal saline and gauze. Pt tolerated procedure well.

## 2016-01-30 NOTE — ED Notes (Signed)
Dr. Dolores FrameSung in room suturing patient forehead. Pt tolerated procedure well.

## 2016-01-30 NOTE — ED Provider Notes (Signed)
[facility] Emergency Department Provider Note   ____________________________________________   First MD Initiated Contact with Patient 01/30/16 [id number]     (approximate)  I have reviewed the triage vital signs and the nursing notes.   HISTORY  Chief Complaint Fall    HPI [name] is a 82 y.o. female brought [facility] the ED [facility] with a chief complaint of fall with head injury and possible left shoulder dislocation. EMS reports patient attempted to get her walker to ambulate to the restroom and fell, striking her forehead on the TV stand. Patient thinks she had loss of consciousness. EMS gave 100 mcg fentanyl prior to arrival for left shoulder pain and deformity suggestive of dislocation. Review of patient's chart demonstrates she has a recent UTI and is currently on antibiotics. Patient denies recent fever, chills, chest pain, shortness breath, abdominal pain, nausea, vomiting, diarrhea. Nothing makes her symptoms better. Movement makes her shoulder pain worse. Tetanus is up-to-date.   Past Medical History:  Diagnosis Date  . Anemia   . Chronic kidney disease    Stage 3  . CKD (chronic kidney disease), stage III   . GERD (gastroesophageal reflux disease)   . Heart disease   . History of stomach ulcers   . Hyperlipidemia   . Hypertension   . Hypothyroidism   . Lymphedema   . OA (osteoarthritis)   . Pneumonia   . Recurrent UTI     Patient Active Problem List   Diagnosis Date Noted  . ASCVD (arteriosclerotic cardiovascular disease) 08/05/2015  . Hiatal hernia 08/05/2015  . Gastric ulcer 08/05/2015  . Hypertension 08/05/2015  . Adult hypothyroidism 08/05/2015  . Osteoarthritis 08/05/2015  . Recurrent UTI 04/04/2015  . Atrophic vaginitis 04/04/2015  . Incontinence 04/04/2015  . Benign hypertension with CKD (chronic kidney disease) stage III 10/16/2014  . Stage III chronic kidney disease 10/16/2014  . Dehydration  10/16/2014  . Sepsis (HCC) 09/22/2014  . Hypoxia 09/22/2014  . Healthcare-associated pneumonia 09/22/2014  . ARF (acute renal failure) (HCC) 09/22/2014  . Lactic acid acidosis 09/22/2014  . Personal history of urinary infection 03/29/2014  . History of recurrent UTIs 03/29/2014  . Frequent falls 09/06/2013  . History of fall 09/06/2013  . Chronic insomnia 08/09/2013  . Ankle edema 09/30/2012  . Edema 09/30/2012  . CAD, ARTERY BYPASS GRAFT 06/21/2009  . HYPOTHYROIDISM 07/17/2008  . Hyperlipidemia 07/17/2008  . HTN (hypertension) 07/17/2008  . GERD (gastroesophageal reflux disease) 07/17/2008  . DYSPNEA 07/17/2008  . Chest pain 07/17/2008  . Essential hypertension 07/17/2008  . Other and unspecified hyperlipidemia 07/17/2008    Past Surgical History:  Procedure Laterality Date  . ABDOMINAL HYSTERECTOMY    . CHOLECYSTECTOMY    . CORONARY ARTERY BYPASS GRAFT      Prior to Admission medications   Medication Sig Start Date End Date Taking? Authorizing Provider  ALPRAZolam Prudy Feeler(XANAX) 1 MG tablet Take 1 mg by mouth at bedtime. For anxiety    Historical Provider, MD  aspirin 81 MG EC tablet Take 81 mg by mouth daily.      Historical Provider, MD  conjugated estrogens (PREMARIN) vaginal cream Place 1 Applicatorful vaginally daily. Apply 0.5mg  (pea-sized amount)  just inside the vaginal introitus with a finger-tip every night for two weeks and then Monday, Wednesday and Friday nights. Patient not taking: Reported on 11/05/2015 05/02/15   Carollee HerterShannon A McGowan, PA-C  Cranberry 500 MG CHEW Chew 1 tablet by mouth daily.     Historical Provider,  MD  docusate sodium (COLACE) 100 MG capsule Take 100 mg by mouth daily. Reported on 05/02/2015    Historical Provider, MD  estradiol (ESTRACE VAGINAL) 0.1 MG/GM vaginal cream Apply 0.5mg  (pea-sized amount)  just inside the vaginal introitus with a finger-tip every night for two weeks and then Monday, Wednesday and Friday nights. 09/09/15   [name] A McGowan,  PA-C  gabapentin (NEURONTIN) 100 MG capsule Take 100 mg by mouth 3 (three) times daily.     Historical Provider, MD  HYDROcodone-acetaminophen (NORCO) 10-325 MG per tablet Take 1 tablet by mouth every 4 (four) hours as needed for moderate pain. Reported on 08/05/2015    Historical Provider, MD  levothyroxine (SYNTHROID, LEVOTHROID) 75 MCG tablet  07/30/15   Historical Provider, MD  loperamide (IMODIUM) 2 MG capsule Take 2 mg by mouth as needed for diarrhea or loose stools. Reported on 05/02/2015    Historical Provider, MD  metoprolol succinate (TOPROL-XL) 50 MG 24 hr tablet Take 50 mg by mouth daily.    Historical Provider, MD  mirtazapine (REMERON) 7.5 MG tablet Take 7.5 mg by mouth at bedtime.    Historical Provider, MD  MYRBETRIQ 50 MG TB24 tablet TAKE 1 TABLET BY MOUTH EVERY DAY 12/30/15   Harle Battiest, PA-C  neomycin-bacitracin-polymyxin (NEOSPORIN) [phone] ointment Apply 1 application topically as needed (skin tears, abrasions, or minor irritations). Reported on 04/04/2015    Historical Provider, MD  omeprazole (PRILOSEC) 20 MG capsule Take 20 mg by mouth daily. Reported on 04/16/2015    Historical Provider, MD  pantoprazole (PROTONIX) 40 MG tablet Take 40 mg by mouth daily.    Historical Provider, MD  potassium chloride SA (KLOR-CON M20) 20 MEQ tablet Take 20 mEq by mouth 3 (three) times a week. Reported on 04/04/2015 12/31/14   Historical Provider, MD  simvastatin (ZOCOR) 40 MG tablet Take 1 tablet (40 mg total) by mouth at bedtime. 10/16/15   Antonieta Iba, MD  sucralfate (CARAFATE) 1 G tablet Take 1 g by mouth daily.     Historical Provider, MD  torsemide (DEMADEX) 10 MG tablet Take 10 mg by mouth daily.    Historical Provider, MD  vitamin B-12 (CYANOCOBALAMIN) 1000 MCG tablet Take 1,000 mcg by mouth every morning.     Historical Provider, MD    Allergies Augmentin [amoxicillin-pot clavulanate]; Iodine; and Tetanus toxoids  Family History  Problem Relation Age of Onset  . Kidney  disease Neg Hx   . Bladder Cancer Neg Hx     Social History Social History  Substance Use Topics  . Smoking status: Never Smoker  . Smokeless tobacco: Never Used  . Alcohol use No    Review of Systems  Constitutional: No fever/chills. Eyes: No visual changes. ENT: No sore throat. Cardiovascular: Denies chest pain. Respiratory: Denies shortness of breath. Gastrointestinal: No abdominal pain.  No nausea, no vomiting.  No diarrhea.  No constipation. Genitourinary: Negative for dysuria. Musculoskeletal: Negative for back pain. Skin: Negative for rash. Neurological: Negative for headaches, focal weakness or numbness.  10-point ROS otherwise negative.  ____________________________________________   PHYSICAL EXAM:  VITAL SIGNS: ED Triage Vitals  Enc Vitals Group     BP 01/30/16 0454 (!) 141/75     Pulse --      Resp 01/30/16 0454 18     Temp 01/30/16 0454 97.5 F (36.4 C)     Temp Source 01/30/16 0454 Oral     SpO2 01/30/16 0454 94 %     Weight 01/30/16 0457 148 lb (  67.1 kg)     Height 01/30/16 0457 5\' 2"  (1.575 m)     Head Circumference --      Peak Flow --      Pain Score 01/30/16 0458 10     Pain Loc --      Pain Edu? --      Excl. in GC? --     Constitutional: Alert and oriented. Frail appearing and in mild acute distress. Eyes: Conjunctivae are normal. PERRL. EOMI. Head: Atraumatic. Nose: No congestion/rhinnorhea. Mouth/Throat: Mucous membranes are moist.  Oropharynx non-erythematous. Neck: No stridor.   Cardiovascular: Normal rate, regular rhythm. Grossly normal heart sounds.  Good peripheral circulation. Respiratory: Normal respiratory effort.  No retractions. Lungs CTAB. Gastrointestinal: Soft and nontender. No distention. No abdominal bruits. No CVA tenderness. Musculoskeletal: Left shoulder held in adduction and internal rotation. Limited range of motion secondary to pain. Tender to palpation with AC deformity noted. 2+ radial pulses. Less than 5 second  capillary refill. No lower extremity tenderness nor edema.  No joint effusions. Neurologic:  Normal speech and language. No gross focal neurologic deficits are appreciated.  Skin:  Skin is warm, dry and intact. No rash noted. Psychiatric: Mood and affect are normal. Speech and behavior are normal.  ____________________________________________   LABS (all labs ordered are listed, but only abnormal results are displayed)  Labs Reviewed  CBC WITH DIFFERENTIAL/PLATELET - Abnormal; Notable for the following:       Result Value   RBC 3.70 (*)    Hemoglobin 11.8 (*)    HCT 33.7 (*)    Monocytes Absolute 1.2 (*)    All other components within normal limits  BASIC METABOLIC PANEL - Abnormal; Notable for the following:    Glucose, Bld 132 (*)    Creatinine, Ser 1.13 (*)    Calcium 8.6 (*)    GFR calc non Af Amer 41 (*)    GFR calc Af Amer 48 (*)    All other components within normal limits  TROPONIN I   ____________________________________________  EKG  ED ECG REPORT I, Katrisha Segall J, the attending physician, personally viewed and interpreted this ECG.   Date: 01/30/2016  EKG Time: 0458  Rate: 62  Rhythm: normal EKG, normal sinus rhythm  Axis: Normal  Intervals:none  ST&T Change: Nonspecific  ____________________________________________  RADIOLOGY  CT head without contrast interpreted per Dr. Gwenyth Bender: No acute intracranial hemorrhage.    Age-related atrophy and chronic microvascular ischemic changes.   Left shoulder x-rays (viewed by me, interpreted per Dr. Gwenyth Bender): Anterior dislocation of the left shoulder. No fracture.  Pelvis and chest x-ray (viewed by me, interpreted per Dr. Phill Myron:  No radiographic evidence for acute traumatic injury within the  pelvis.   1. Shallow lung inflation with mild patchy left basilar opacity.  Finding felt to most likely reflect atelectasis. Infiltrate could be  considered in the correct clinical setting.  2. No other active  cardiopulmonary disease.  3. Anterior/inferior dislocation of the left humeral head.  4. Stable cardiomegaly with sequela prior CABG.  5. Aortic atherosclerosis.   Post reduction left shoulder portable x-ray (viewed by me, interpreted per Dr. Phill Myron): Left humeral head in normal anatomic alignment status post  reduction. No fracture.   ____________________________________________   PROCEDURES  Procedure(s) performed:   Reduction of dislocation Date/Time: 7:07 AM Performed by: Irean Hong Authorized by: Irean Hong Consent: Verbal consent obtained. Risks and benefits: risks, benefits and alternatives were discussed Consent given by: patient Required items: required blood products, implants, devices,  and special equipment available Time out: Immediately prior to procedure a "time out" was called to verify the correct patient, procedure, equipment, support staff and site/side marked as required.  Patient sedated: No - IV fentanyl and intra-articular marcaine  Vitals: Vital signs were monitored during sedation. Patient tolerance: Patient tolerated the procedure well with no immediate complications. Joint: Left shoulder Reduction technique: Traction-counter traction   LACERATION REPAIR Performed by: Irean HongSUNG,Ludie Pavlik J Authorized by: Irean HongSUNG,Yohana Bartha J Consent: Verbal consent obtained. Risks and benefits: risks, benefits and alternatives were discussed Consent given by: patient Patient identity confirmed: provided demographic data Prepped and Draped in normal sterile fashion Wound explored  Laceration Location: Forehead  Laceration Length: 2cm  No Foreign Bodies seen or palpated  Anesthesia: local infiltration  Local anesthetic: marcaine  Anesthetic total: 8 ml  Irrigation method: syringe Amount of cleaning: standard  Skin closure: 5-0 Nylon  Number of sutures: continuous  Technique: standard sterile  Patient tolerance: Patient tolerated the procedure well with no  immediate complications.   Procedures  Critical Care performed: No  ____________________________________________   INITIAL IMPRESSION / ASSESSMENT AND PLAN / ED COURSE  Pertinent labs & imaging results that were available during my care of the patient were reviewed by me and considered in my medical decision making (see chart for details).  82 year old female who presents status post fall with forehead laceration and possible left shoulder dislocation. Will administer analgesia and obtain CT head and x-ray imaging studies.  Clinical Course as of Jan 29 706  Thu Jan 30, 2016  0704 Patient's shoulder was reduced with intra-arterial Marcaine and fentanyl. Facial laceration was repaired. Patient tolerated both procedures well. She is currently sleeping in no acute distress. Discussed with daughter at length - once patient is recovered and awake, will attempt PO challenge and assess her gait with walker. She may be limited by her left shoulder immobilizer. Daughter reports there is no respite care or skilled nursing part of [facility]. If patient is unable to maneuver her walker and/or exhibit unsteady gait, she may require clinical social work and physical therapy consults to evaluate rehabilitation placement. Daughter verbalizes understanding of the above discussion and agrees with plan of care. Care transferred to Dr. Don PerkingVeronese.  [JS]    Clinical Course User Index [JS] Irean HongJade J Khrystian Schauf, MD     ____________________________________________   FINAL CLINICAL IMPRESSION(S) / ED DIAGNOSES  Final diagnoses:  Fall, initial encounter  Laceration of forehead, initial encounter  Shoulder dislocation, left, initial encounter      NEW MEDICATIONS STARTED DURING THIS VISIT:  New Prescriptions   No medications on file     Note:  This document was prepared using Dragon voice recognition software and may include unintentional dictation errors.    Irean HongJade J Telly Jawad, MD 01/30/16 0800

## 2016-01-30 NOTE — ED Notes (Signed)
Patient transported to CT via stretcher.

## 2016-01-30 NOTE — Clinical Social Work Note (Signed)
Clinical Social Work Assessment  Patient Details  Name: [name] MRN: [id number] Date of Birth: 1922/08/22  Date of referral:  01/30/16               Reason for consult:  Facility Placement                Permission sought to share information with:  Oceanographeracility Contact Representative Permission granted to share information::  Yes, Verbal Permission Granted  Name::      Skilled Nursing Facility   Agency::    County   Relationship::     Contact Information:     Housing/Transportation Living arrangements for the past 2 months:  Assisted Living Facility Source of Information:  Patient, Adult Children, Facility Patient Interpreter Needed:  None Criminal Activity/Legal Involvement Pertinent to Current Situation/Hospitalization:  No - Comment as needed Significant Relationships:  Adult Children Lives with:  Facility Resident Do you feel safe going back to the place where you live?  Yes Need for family participation in patient care:  Yes (Comment)  Care giving concerns:  Patient is a long term care resident at [name].    Social Worker assessment / plan:  Visual merchandiserClinical Social Worker (CSW) received SNF consult. PT recommended SNF. Per Erin [facility]vette med tech at [facility] is a private pay resident and walks with a walker at baseline. Per med tech patient has been a resident since 10/01/14 and is on room air at baseline. CSW made med tech aware that patient will be placed at rehab SNF. Per RN patient has a dislocated shoulder and was put in an imobilzer and will follow up with Ortho MD outpatient. CSW contacted patient's daughter [name] who is agreeable to SNF search. FL2 complete and faxed out. CSW presented bed offers. D[facility].   Patient is medically stable for D/C to [facility] today. [name] authorization has been received. Auth # [id number]. Per [name] admissions coordinator at [name] patient can come today to room 510. RN will call report. CSW sent  signed FL2 to [facility], which they will use for the medications. Patient's daughter [name] will provide transport in private vehicle. Please reconsult if future social work needs arise. CSW signing off.   Employment status:  Retired Database administratornsurance information:  Managed Medicare PT Recommendations:  Skilled Nursing Facility Information / Referral to community resources:  Skilled Nursing Facility  Patient/Family's Response to care:  Patient and her daughter [name] are agreeable for patient going to [facility] today.   Patient/Family's Understanding of and Emotional Response to Diagnosis, Current Treatment, and Prognosis:  Patient and daughter were very pleasant and thanked CSW for assistance.   Emotional Assessment Appearance:  Appears stated age Attitude/Demeanor/Rapport:    Affect (typically observed):  Accepting, Adaptable, Pleasant Orientation:  Oriented to Self, Oriented to Place, Oriented to  Time, Oriented to Situation Alcohol / Substance use:  Not Applicable Psych involvement (Current and /or in the community):  No (Comment)  Discharge Needs  Concerns to be addressed:  Discharge Planning Concerns Readmission within the last 30 days:  No Current discharge risk:  None Barriers to Discharge:  No Barriers Identified   [name], Erin CrockerBailey M, LCSW 01/30/2016, 1:52 PM

## 2016-01-30 NOTE — ED Notes (Signed)
Dr. Dolores FrameSung and Dr. Manson PasseyBrown in room to attempt to reduce LEFT shoulder. Pt placed on 2 L nasal cannula. Pt tolerated procedure well.

## 2016-01-30 NOTE — Clinical Social Work Placement (Addendum)
   CLINICAL SOCIAL WORK PLACEMENT  NOTE  Date:  01/30/2016  Patient Details  Name: [name] MRN: [id number] Date of Birth: 29-Sep-1922  Clinical Social Work is seeking post-discharge placement for this patient at the Skilled  Nursing Facility level of care (*CSW will initial, date and re-position this form in  chart as items are completed):  Yes   Patient/family provided with [place] Clinical Social Work Department's list of facilities offering this level of care within the geographic area requested by the patient (or if unable, by the patient's family).  Yes   Patient/family informed of their freedom to choose among providers that offer the needed level of care, that participate in Medicare, Medicaid or managed care program needed by the patient, have an available bed and are willing to accept the patient.  Yes   Patient/family informed of [facility] ownership interest in [facility] and [facility], as well as of the fact that they are under no obligation to receive care at these facilities.  PASRR submitted to EDS on    PASRR number received on   Existing PASRR number confirmed on 01/30/16   FL2 transmitted to all facilities in geographic area requested by pt/family on 01/30/16     FL2 transmitted to all facilities within larger geographic area on       Patient informed that his/her managed care company has contracts with or will negotiate with certain facilities, including the following:        Yes   Patient/family informed of bed offers received.  Patient [facility] bed at  [name]     Physician recommends and patient chooses bed at      Patient to be transferred to  [name] on 01/30/16.  Patient to be transferred to facility by  (Patinet's daughter Eunice BlaseDebbie will provide transport. )     Patient family notified on 01/30/16 of transfer.  Name of family member notified:   (Patient's daughter Eunice BlaseDebbie is at bedside and aware of D/C  today. )     PHYSICIAN       Additional Comment:    _______________________________________________ Maciej Schweitzer, [name], LCSW 01/30/2016, 1:49 PM

## 2016-01-31 DIAGNOSIS — S43015A Anterior dislocation of left humerus, initial encounter: Secondary | ICD-10-CM | POA: Diagnosis not present

## 2016-02-13 DIAGNOSIS — S43015A Anterior dislocation of left humerus, initial encounter: Secondary | ICD-10-CM | POA: Diagnosis not present

## 2016-02-14 ENCOUNTER — Other Ambulatory Visit: Payer: Self-pay | Admitting: *Deleted

## 2016-02-14 NOTE — Patient Outreach (Signed)
[facility]) Care Management  02/14/2016  Elizebath Wever 07-03-1922 [id number]   [facility]) Care Management Post-Acute Care Coordination  02/14/2016  Chanell Nadeau 1922/08/05 [id number]   Met with Drue Novel, SW for [employer]. Reviewed patient case. She confirms that patient is a resident of an ALF and plans to return to ALF upon discharge from skilled facility. No [facility] care management needs assessed.  RNCM will sign off case.  Royetta Crochet. Laymond Purser, RN, BSN, [place] Post-Acute Care Coordinator [phone]

## 2016-02-25 DIAGNOSIS — S43085D Other dislocation of left shoulder joint, subsequent encounter: Secondary | ICD-10-CM | POA: Diagnosis not present

## 2016-02-25 DIAGNOSIS — M6281 Muscle weakness (generalized): Secondary | ICD-10-CM | POA: Diagnosis not present

## 2016-02-25 DIAGNOSIS — M25512 Pain in left shoulder: Secondary | ICD-10-CM | POA: Diagnosis not present

## 2016-02-25 DIAGNOSIS — R262 Difficulty in walking, not elsewhere classified: Secondary | ICD-10-CM | POA: Diagnosis not present

## 2016-02-27 DIAGNOSIS — R262 Difficulty in walking, not elsewhere classified: Secondary | ICD-10-CM | POA: Diagnosis not present

## 2016-02-27 DIAGNOSIS — M25512 Pain in left shoulder: Secondary | ICD-10-CM | POA: Diagnosis not present

## 2016-02-27 DIAGNOSIS — M6281 Muscle weakness (generalized): Secondary | ICD-10-CM | POA: Diagnosis not present

## 2016-02-27 DIAGNOSIS — S43085D Other dislocation of left shoulder joint, subsequent encounter: Secondary | ICD-10-CM | POA: Diagnosis not present

## 2016-03-02 DIAGNOSIS — S43085D Other dislocation of left shoulder joint, subsequent encounter: Secondary | ICD-10-CM | POA: Diagnosis not present

## 2016-03-02 DIAGNOSIS — M25512 Pain in left shoulder: Secondary | ICD-10-CM | POA: Diagnosis not present

## 2016-03-02 DIAGNOSIS — R262 Difficulty in walking, not elsewhere classified: Secondary | ICD-10-CM | POA: Diagnosis not present

## 2016-03-02 DIAGNOSIS — M6281 Muscle weakness (generalized): Secondary | ICD-10-CM | POA: Diagnosis not present

## 2016-03-04 DIAGNOSIS — I10 Essential (primary) hypertension: Secondary | ICD-10-CM | POA: Diagnosis not present

## 2016-03-04 DIAGNOSIS — Z87440 Personal history of urinary (tract) infections: Secondary | ICD-10-CM | POA: Diagnosis not present

## 2016-03-04 DIAGNOSIS — E039 Hypothyroidism, unspecified: Secondary | ICD-10-CM | POA: Diagnosis not present

## 2016-03-04 DIAGNOSIS — R0602 Shortness of breath: Secondary | ICD-10-CM | POA: Diagnosis not present

## 2016-03-04 DIAGNOSIS — R630 Anorexia: Secondary | ICD-10-CM | POA: Diagnosis not present

## 2016-03-04 DIAGNOSIS — N390 Urinary tract infection, site not specified: Secondary | ICD-10-CM | POA: Diagnosis not present

## 2016-03-05 DIAGNOSIS — M25512 Pain in left shoulder: Secondary | ICD-10-CM | POA: Diagnosis not present

## 2016-03-05 DIAGNOSIS — M6281 Muscle weakness (generalized): Secondary | ICD-10-CM | POA: Diagnosis not present

## 2016-03-05 DIAGNOSIS — R262 Difficulty in walking, not elsewhere classified: Secondary | ICD-10-CM | POA: Diagnosis not present

## 2016-03-05 DIAGNOSIS — S43085D Other dislocation of left shoulder joint, subsequent encounter: Secondary | ICD-10-CM | POA: Diagnosis not present

## 2016-03-06 DIAGNOSIS — R262 Difficulty in walking, not elsewhere classified: Secondary | ICD-10-CM | POA: Diagnosis not present

## 2016-03-06 DIAGNOSIS — M6281 Muscle weakness (generalized): Secondary | ICD-10-CM | POA: Diagnosis not present

## 2016-03-06 DIAGNOSIS — M25512 Pain in left shoulder: Secondary | ICD-10-CM | POA: Diagnosis not present

## 2016-03-06 DIAGNOSIS — S43085D Other dislocation of left shoulder joint, subsequent encounter: Secondary | ICD-10-CM | POA: Diagnosis not present

## 2016-03-09 DIAGNOSIS — M6281 Muscle weakness (generalized): Secondary | ICD-10-CM | POA: Diagnosis not present

## 2016-03-09 DIAGNOSIS — R262 Difficulty in walking, not elsewhere classified: Secondary | ICD-10-CM | POA: Diagnosis not present

## 2016-03-09 DIAGNOSIS — M25512 Pain in left shoulder: Secondary | ICD-10-CM | POA: Diagnosis not present

## 2016-03-09 DIAGNOSIS — S43085D Other dislocation of left shoulder joint, subsequent encounter: Secondary | ICD-10-CM | POA: Diagnosis not present

## 2016-03-10 DIAGNOSIS — S43085D Other dislocation of left shoulder joint, subsequent encounter: Secondary | ICD-10-CM | POA: Diagnosis not present

## 2016-03-10 DIAGNOSIS — M25512 Pain in left shoulder: Secondary | ICD-10-CM | POA: Diagnosis not present

## 2016-03-10 DIAGNOSIS — M6281 Muscle weakness (generalized): Secondary | ICD-10-CM | POA: Diagnosis not present

## 2016-03-10 DIAGNOSIS — R262 Difficulty in walking, not elsewhere classified: Secondary | ICD-10-CM | POA: Diagnosis not present

## 2016-03-11 ENCOUNTER — Telehealth: Payer: Self-pay | Admitting: Urology

## 2016-03-11 NOTE — Telephone Encounter (Signed)
Pt's daughter called and wants a refill on vaginal [facility]

## 2016-03-11 NOTE — Telephone Encounter (Signed)
Medication called into [facility]. 

## 2016-03-13 DIAGNOSIS — S43085D Other dislocation of left shoulder joint, subsequent encounter: Secondary | ICD-10-CM | POA: Diagnosis not present

## 2016-03-13 DIAGNOSIS — M25512 Pain in left shoulder: Secondary | ICD-10-CM | POA: Diagnosis not present

## 2016-03-16 DIAGNOSIS — R531 Weakness: Secondary | ICD-10-CM | POA: Diagnosis not present

## 2016-03-16 DIAGNOSIS — K30 Functional dyspepsia: Secondary | ICD-10-CM | POA: Diagnosis not present

## 2016-03-16 DIAGNOSIS — R251 Tremor, unspecified: Secondary | ICD-10-CM | POA: Diagnosis not present

## 2016-03-16 DIAGNOSIS — E039 Hypothyroidism, unspecified: Secondary | ICD-10-CM | POA: Diagnosis not present

## 2016-03-17 ENCOUNTER — Telehealth: Payer: Self-pay | Admitting: Cardiovascular Disease

## 2016-03-17 NOTE — Telephone Encounter (Signed)
Daughter calling .  Patient c/o weakness fatigue and bp fluctuating.    Last BP 12:15pm  152/81  HR 69  Scheduled Friday 2/9 at 11:40 am

## 2016-03-18 ENCOUNTER — Emergency Department: Payer: Medicare HMO

## 2016-03-18 ENCOUNTER — Emergency Department
Admission: EM | Admit: 2016-03-18 | Discharge: 2016-03-18 | Disposition: A | Discharge status: Home / Self Care | Payer: Medicare HMO | Attending: Emergency Medicine | Admitting: Emergency Medicine

## 2016-03-18 ENCOUNTER — Encounter: Payer: Self-pay | Admitting: Emergency Medicine

## 2016-03-18 DIAGNOSIS — R0602 Shortness of breath: Secondary | ICD-10-CM | POA: Diagnosis not present

## 2016-03-18 DIAGNOSIS — I25810 Atherosclerosis of coronary artery bypass graft(s) without angina pectoris: Secondary | ICD-10-CM | POA: Diagnosis not present

## 2016-03-18 DIAGNOSIS — Z79899 Other long term (current) drug therapy: Secondary | ICD-10-CM | POA: Diagnosis not present

## 2016-03-18 DIAGNOSIS — Z951 Presence of aortocoronary bypass graft: Secondary | ICD-10-CM | POA: Insufficient documentation

## 2016-03-18 DIAGNOSIS — R251 Tremor, unspecified: Secondary | ICD-10-CM | POA: Diagnosis not present

## 2016-03-18 DIAGNOSIS — Z7982 Long term (current) use of aspirin: Secondary | ICD-10-CM | POA: Insufficient documentation

## 2016-03-18 DIAGNOSIS — N183 Chronic kidney disease, stage 3 (moderate): Secondary | ICD-10-CM | POA: Diagnosis not present

## 2016-03-18 DIAGNOSIS — E039 Hypothyroidism, unspecified: Secondary | ICD-10-CM | POA: Insufficient documentation

## 2016-03-18 DIAGNOSIS — R531 Weakness: Secondary | ICD-10-CM | POA: Diagnosis not present

## 2016-03-18 DIAGNOSIS — I129 Hypertensive chronic kidney disease with stage 1 through stage 4 chronic kidney disease, or unspecified chronic kidney disease: Secondary | ICD-10-CM | POA: Diagnosis not present

## 2016-03-18 DIAGNOSIS — I10 Essential (primary) hypertension: Secondary | ICD-10-CM | POA: Diagnosis not present

## 2016-03-18 LAB — BASIC METABOLIC PANEL
ANION GAP: 9 (ref 5–15)
BUN: 12 mg/dL (ref 6–20)
CHLORIDE: 108 mmol/L (ref 101–111)
CO2: 24 mmol/L (ref 22–32)
CREATININE: 1.15 mg/dL — AB (ref 0.44–1.00)
Calcium: 9.5 mg/dL (ref 8.9–10.3)
GFR calc Af Amer: 47 mL/min — ABNORMAL LOW (ref >60)
GFR calc non Af Amer: 40 mL/min — ABNORMAL LOW (ref >60)
Glucose, Bld: 102 mg/dL — ABNORMAL HIGH (ref 65–99)
POTASSIUM: 4.2 mmol/L (ref 3.5–5.1)
SODIUM: 141 mmol/L (ref 135–145)

## 2016-03-18 LAB — URINALYSIS, COMPLETE (UACMP) WITH MICROSCOPIC
BILIRUBIN URINE: NEGATIVE
Bacteria, UA: NONE SEEN
Glucose, UA: NEGATIVE mg/dL
Hgb urine dipstick: NEGATIVE
Ketones, ur: NEGATIVE mg/dL
Leukocytes, UA: NEGATIVE
Nitrite: NEGATIVE
PH: 7 (ref 5.0–8.0)
Protein, ur: NEGATIVE mg/dL
RBC / HPF: NONE SEEN RBC/hpf (ref 0–5)
SPECIFIC GRAVITY, URINE: 1.01 (ref 1.005–1.030)

## 2016-03-18 LAB — CBC
HCT: 37.4 % (ref 35.0–47.0)
HEMOGLOBIN: 12.7 g/dL (ref 12.0–16.0)
MCH: 30.7 pg (ref 26.0–34.0)
MCHC: 34.1 g/dL (ref 32.0–36.0)
MCV: 90.2 fL (ref 80.0–100.0)
PLATELETS: 186 10*3/uL (ref 150–440)
RBC: 4.15 MIL/uL (ref 3.80–5.20)
RDW: 14.1 % (ref 11.5–14.5)
WBC: 7.1 10*3/uL (ref 3.6–11.0)

## 2016-03-18 LAB — INFLUENZA PANEL BY PCR (TYPE A & B)
Influenza A By PCR: NEGATIVE
Influenza B By PCR: NEGATIVE

## 2016-03-18 MED ORDER — HYDROCHLOROTHIAZIDE 12.5 MG PO TABS: 12.5000 mg | ORAL_TABLET | Freq: Every day | ORAL | 1 refills | Status: DC

## 2016-03-18 MED ORDER — HYDROCHLOROTHIAZIDE 12.5 MG PO CAPS: 12.5000 mg | ORAL_CAPSULE | Freq: Every day | ORAL | Status: DC

## 2016-03-18 MED ORDER — HYDROCHLOROTHIAZIDE 12.5 MG PO CAPS
12.5000 mg | ORAL_CAPSULE | Freq: Every day | ORAL | Status: DC
  Administered 2016-03-18: 12.5 mg via ORAL
  Filled 2016-03-18: qty 1

## 2016-03-18 MED ORDER — SODIUM CHLORIDE 0.9 % IV BOLUS (SEPSIS)
1000.0000 mL | Freq: Once | INTRAVENOUS | Status: AC
  Administered 2016-03-18: 1000 mL via INTRAVENOUS

## 2016-03-18 NOTE — ED Provider Notes (Signed)
[facility] Emergency Department Provider Note  ____________________________________________  Time seen: Approximately 3:49 PM  I have reviewed the triage vital signs and the nursing notes.   HISTORY  Chief Complaint Tremors and Weakness   HPI [name] is a 83 y.o. female with a history of anemia, chronic kidney disease, CAD, hypertension, hyperlipidemia, hypothyroidism who presents for evaluation of generalized weakness. Patient reports that she has very good health and usually walks a mile every day in her skilled nursing facility. For the last 2 days patient has had generalized weakness and tremor mostly on both of her hands. She saw her primary care doctor 2 days ago and had an extensive blood work including CBC, BMP,TSH, UA which were unrevealing and patient was instructed to return to presents to the emergency room if she is getting worse. Today patient was trying to walk and fell because she was so weak. She was caught by staff of the facility and did not sustain any trauma. Patient has not had a fever, no cough, no sore throat, no body aches, no vomiting, no diarrhea, no dysuria. She does endorse mild shortness of breath, no chest pain. Patient has had decreased appetite and by mouth intake for the last 2 days as well. No new medications. No headache. No unilateral weakness, slurred speech, or difficulty finding words.  Past Medical History:  Diagnosis Date  . Anemia   . Chronic kidney disease    Stage 3  . CKD (chronic kidney disease), stage III   . GERD (gastroesophageal reflux disease)   . Heart disease   . History of stomach ulcers   . Hyperlipidemia   . Hypertension   . Hypothyroidism   . Lymphedema   . OA (osteoarthritis)   . Pneumonia   . Recurrent UTI     Patient Active Problem List   Diagnosis Date Noted  . ASCVD (arteriosclerotic cardiovascular disease) 08/05/2015  . Hiatal hernia 08/05/2015  . Gastric ulcer 08/05/2015  .  Hypertension 08/05/2015  . Adult hypothyroidism 08/05/2015  . Osteoarthritis 08/05/2015  . Recurrent UTI 04/04/2015  . Atrophic vaginitis 04/04/2015  . Incontinence 04/04/2015  . Benign hypertension with CKD (chronic kidney disease) stage III 10/16/2014  . Stage III chronic kidney disease 10/16/2014  . Dehydration 10/16/2014  . Sepsis (HCC) 09/22/2014  . Hypoxia 09/22/2014  . Healthcare-associated pneumonia 09/22/2014  . ARF (acute renal failure) (HCC) 09/22/2014  . Lactic acid acidosis 09/22/2014  . Personal history of urinary infection 03/29/2014  . History of recurrent UTIs 03/29/2014  . Frequent falls 09/06/2013  . History of fall 09/06/2013  . Chronic insomnia 08/09/2013  . Ankle edema 09/30/2012  . Edema 09/30/2012  . CAD, ARTERY BYPASS GRAFT 06/21/2009  . HYPOTHYROIDISM 07/17/2008  . Hyperlipidemia 07/17/2008  . HTN (hypertension) 07/17/2008  . GERD (gastroesophageal reflux disease) 07/17/2008  . DYSPNEA 07/17/2008  . Chest pain 07/17/2008  . Essential hypertension 07/17/2008  . Other and unspecified hyperlipidemia 07/17/2008    Past Surgical History:  Procedure Laterality Date  . ABDOMINAL HYSTERECTOMY    . CORONARY ARTERY BYPASS GRAFT      Prior to Admission medications   Medication Sig Start Date End Date Taking? Authorizing Provider  ALPRAZolam Prudy Feeler(XANAX) 1 MG tablet Take 1 tablet (1 mg total) by mouth at bedtime. For anxiety 01/30/16 01/29/17 Yes Nita Sicklearolina Vikash Nest, MD  aspirin 81 MG EC tablet Take 81 mg by mouth daily.     Yes Historical Provider, MD  Cranberry 500 MG CHEW Chew 1  tablet by mouth 2 (two) times daily.    Yes Historical Provider, MD  gabapentin (NEURONTIN) 100 MG capsule Take 100-200 mg by mouth 3 (three) times daily. Take 100 mg by mouth morning and mid-day, then take 200 mg by mouth at bedtime.   Yes Historical Provider, MD  HYDROcodone-acetaminophen (NORCO) 5-325 MG tablet Take 1 tablet by mouth every 6 (six) hours as needed for moderate  pain. Patient taking differently: Take 1 tablet by mouth every 8 (eight) hours as needed for moderate pain or severe pain.  01/30/16  Yes Irean Hong, MD  levothyroxine (SYNTHROID, LEVOTHROID) 75 MCG tablet  07/30/15  Yes Historical Provider, MD  metoprolol succinate (TOPROL-XL) 50 MG 24 hr tablet Take 50 mg by mouth daily.   Yes Historical Provider, MD  mirtazapine (REMERON) 7.5 MG tablet Take 7.5 mg by mouth at bedtime.   Yes Historical Provider, MD  MYRBETRIQ 50 MG TB24 tablet TAKE 1 TABLET BY MOUTH EVERY DAY Patient taking differently: 1 tab qd 12/30/15  Yes Shannon A McGowan, PA-C  pantoprazole (PROTONIX) 40 MG tablet Take 40 mg by mouth daily.   Yes Historical Provider, MD  potassium chloride SA (K-DUR,KLOR-CON) 20 MEQ tablet Take 20 mEq by mouth daily. 03/09/16 03/09/17 Yes Historical Provider, MD  simvastatin (ZOCOR) 40 MG tablet Take 1 tablet (40 mg total) by mouth at bedtime. Patient taking differently: Take 40 mg by mouth daily.  10/16/15  Yes Antonieta Iba, MD  sucralfate (CARAFATE) 1 G tablet Take 1 g by mouth daily.    Yes Historical Provider, MD  vitamin B-12 (CYANOCOBALAMIN) 1000 MCG tablet Take 1,000 mcg by mouth every morning.    Yes Historical Provider, MD  conjugated estrogens (PREMARIN) vaginal cream Place 1 Applicatorful vaginally daily. Apply 0.5mg  (pea-sized amount)  just inside the vaginal introitus with a finger-tip every night for two weeks and then Monday, Wednesday and Friday nights. Patient not taking: Reported on 01/30/2016 05/02/15   Harle Battiest, PA-C  estradiol (ESTRACE VAGINAL) 0.1 MG/GM vaginal cream Apply 0.5mg  (pea-sized amount)  just inside the vaginal introitus with a finger-tip every night for two weeks and then Monday, Wednesday and Friday nights. 09/09/15   [name] A McGowan, PA-C  hydrochlorothiazide (HYDRODIURIL) 12.5 MG tablet Take 1 tablet (12.5 mg total) by mouth daily. 03/18/16   Nita Sickle, MD    Allergies Augmentin [amoxicillin-pot  clavulanate]; Iodine; and Tetanus toxoids  Family History  Problem Relation Age of Onset  . Kidney disease Neg Hx   . Bladder Cancer Neg Hx     Social History Social History  Substance Use Topics  . Smoking status: Never Smoker  . Smokeless tobacco: Never Used  . Alcohol use No    Review of Systems  Constitutional: Negative for fever. + generalized weakness Eyes: Negative for visual changes. ENT: Negative for sore throat. Neck: No neck pain  Cardiovascular: Negative for chest pain. Respiratory: Negative for shortness of breath. Gastrointestinal: Negative for abdominal pain, vomiting or diarrhea. + decreased appetite Genitourinary: Negative for dysuria. Musculoskeletal: Negative for back pain. Skin: Negative for rash. Neurological: Negative for headaches, weakness or numbness. Psych: No SI or HI  ____________________________________________   PHYSICAL EXAM:  VITAL SIGNS: ED Triage Vitals [03/18/16 1013]  Enc Vitals Group     BP 138/72     Pulse Rate 79     Resp 16     Temp 98 F (36.7 C)     Temp Source Oral     SpO2 95 %  Weight 133 lb (60.3 kg)     Height 5\' 3"  (1.6 m)     Head Circumference      Peak Flow      Pain Score      Pain Loc      Pain Edu?      Excl. in GC?     Constitutional: Alert and oriented. Well appearing and in no apparent distress. HEENT:      Head: Normocephalic and atraumatic.         Eyes: Conjunctivae are normal. Sclera is non-icteric. EOMI. PERRL      Mouth/Throat: Mucous membranes are moist.       Neck: Supple with no signs of meningismus. Cardiovascular: Regular rate and rhythm. No murmurs, gallops, or rubs. 2+ symmetrical distal pulses are present in all extremities. No JVD. Respiratory: Normal respiratory effort. Lungs are clear to auscultation bilaterally. No wheezes, crackles, or rhonchi.  Gastrointestinal: Soft, non tender, and non distended with positive bowel sounds. No rebound or guarding. Musculoskeletal:  Nontender with normal range of motion in all extremities. No edema, cyanosis, or erythema of extremities. Neurologic: Normal speech and language. Fine tremor noticed on b/l UE. A & O x3, PERRL, no nystagmus, CN II-XII intact, motor testing reveals good tone and bulk throughout. There is no evidence of pronator drift or dysmetria. Muscle strength is 5/5 throughout. Deep tendon reflexes are 2+ throughout with downgoing toes. Sensory examination is intact. Gait deferred Skin: Skin is warm, dry and intact. No rash noted. Psychiatric: Mood and affect are normal. Speech and behavior are normal.  ____________________________________________   LABS (all labs ordered are listed, but only abnormal results are displayed)  Labs Reviewed  BASIC METABOLIC PANEL - Abnormal; Notable for the following:       Result Value   Glucose, Bld 102 (*)    Creatinine, Ser 1.15 (*)    GFR calc non Af Amer 40 (*)    GFR calc Af Amer 47 (*)    All other components within normal limits  URINALYSIS, COMPLETE (UACMP) WITH MICROSCOPIC - Abnormal; Notable for the following:    Color, Urine YELLOW (*)    APPearance CLEAR (*)    Squamous Epithelial / LPF 0-5 (*)    All other components within normal limits  CBC  INFLUENZA PANEL BY PCR (TYPE A & B)   ____________________________________________  EKG  ED ECG REPORT I, Nita Sickle, the attending physician, personally viewed and interpreted this ECG.  Atrial rhythm, rate of 81, normal intervals, normal axis, no ST elevations or depressions. Unchanged from prior ____________________________________________  RADIOLOGY  CXR: Negative  Head CT: negative ____________________________________________   PROCEDURES  Procedure(s) performed: None Procedures Critical Care performed:  None ____________________________________________   INITIAL IMPRESSION / ASSESSMENT AND PLAN / ED COURSE  83 y.o. female with a history of anemia, chronic kidney disease, CAD,  hypertension, hyperlipidemia, hypothyroidism who presents for evaluation of generalized weakness and tremor. Saw her PCP 2 days ago and had her dose of Remeron reduced. Had normal blood work including CBC, CMP, urinalysis, thyroid studies. Basic blood work was repeated here in triage with no acute changes. Patient is complaining of mild shortness of breath so will check for flu and get a chest x-ray. She does have an appointment with her PCP in 2 days for close follow-up.    ED COURSE: CT head with no acute findings, chest x-ray negative, flu negative, blood work within normal limits. Patient has had persistently elevated blood pressures here in  the 170s and 180s. I review her recent visit 2 days ago with her primary care doctor and she also had elevated blood pressure of 180. We'll start patient hydrochlorothiazide. We'll discharge her home with close follow-up in 2 days with her primary care doctor.  Pertinent labs & imaging results that were available during my care of the patient were reviewed by me and considered in my medical decision making (see chart for details).    ____________________________________________   FINAL CLINICAL IMPRESSION(S) / ED DIAGNOSES  Final diagnoses:  Generalized weakness  Tremor  Hypertension, unspecified type      NEW MEDICATIONS STARTED DURING THIS VISIT:  New Prescriptions   HYDROCHLOROTHIAZIDE (HYDRODIURIL) 12.5 MG TABLET    Take 1 tablet (12.5 mg total) by mouth daily.     Note:  This document was prepared using Dragon voice recognition software and may include unintentional dictation errors.    Nita Sickle, MD 03/18/16 1800

## 2016-03-18 NOTE — ED Triage Notes (Addendum)
Pt presents to ED with her daughter, per daughter tremors began Monday morning. Pt's daughter reports that pt's BP has been elevated, as high as 195/91/ Pt is noted to have a mild tremor to her hands. Pt's daughter also reports increasing weakness and decreased PO intake at this time. Pt's daughter reports patient being seen on Monday and having lab work done, pt's daughter states no reason for tremors found but if she got worse she was instructed to return.

## 2016-03-18 NOTE — ED Notes (Signed)
Pt unable to urinate at this time.  

## 2016-03-20 ENCOUNTER — Other Ambulatory Visit: Payer: Self-pay | Admitting: Internal Medicine

## 2016-03-20 ENCOUNTER — Ambulatory Visit: Payer: Commercial Managed Care - HMO | Admitting: Cardiovascular Disease

## 2016-03-20 DIAGNOSIS — F4489 Other dissociative and conversion disorders: Secondary | ICD-10-CM | POA: Diagnosis not present

## 2016-03-20 DIAGNOSIS — R251 Tremor, unspecified: Secondary | ICD-10-CM

## 2016-03-20 DIAGNOSIS — I10 Essential (primary) hypertension: Secondary | ICD-10-CM | POA: Diagnosis not present

## 2016-03-23 ENCOUNTER — Other Ambulatory Visit: Payer: Self-pay | Admitting: Internal Medicine

## 2016-03-23 DIAGNOSIS — R251 Tremor, unspecified: Secondary | ICD-10-CM

## 2016-03-23 DIAGNOSIS — F4489 Other dissociative and conversion disorders: Secondary | ICD-10-CM

## 2016-03-24 ENCOUNTER — Ambulatory Visit
Admission: RE | Admit: 2016-03-24 | Discharge: 2016-03-24 | Disposition: A | Discharge status: Home / Self Care | Payer: Medicare HMO | Source: Ambulatory Visit | Attending: Internal Medicine | Admitting: Internal Medicine

## 2016-03-24 DIAGNOSIS — F4489 Other dissociative and conversion disorders: Secondary | ICD-10-CM | POA: Insufficient documentation

## 2016-03-24 DIAGNOSIS — R251 Tremor, unspecified: Secondary | ICD-10-CM | POA: Insufficient documentation

## 2016-03-27 ENCOUNTER — Ambulatory Visit: Payer: Medicare HMO

## 2016-03-31 ENCOUNTER — Ambulatory Visit: Payer: Medicare HMO

## 2016-04-23 ENCOUNTER — Other Ambulatory Visit: Payer: Self-pay | Admitting: Urology

## 2016-04-23 DIAGNOSIS — N3281 Overactive bladder: Secondary | ICD-10-CM

## 2016-04-29 ENCOUNTER — Encounter: Payer: Self-pay | Admitting: Emergency Medicine

## 2016-04-29 ENCOUNTER — Emergency Department: Payer: Medicare HMO

## 2016-04-29 ENCOUNTER — Emergency Department
Admission: EM | Admit: 2016-04-29 | Discharge: 2016-04-29 | Disposition: A | Discharge status: Home / Self Care | Payer: Medicare HMO | Attending: Student in an Organized Health Care Education/Training Program | Admitting: Student in an Organized Health Care Education/Training Program

## 2016-04-29 DIAGNOSIS — S41111A Laceration without foreign body of right upper arm, initial encounter: Secondary | ICD-10-CM | POA: Diagnosis not present

## 2016-04-29 DIAGNOSIS — Y999 Unspecified external cause status: Secondary | ICD-10-CM | POA: Insufficient documentation

## 2016-04-29 DIAGNOSIS — Z79899 Other long term (current) drug therapy: Secondary | ICD-10-CM | POA: Insufficient documentation

## 2016-04-29 DIAGNOSIS — S51811A Laceration without foreign body of right forearm, initial encounter: Secondary | ICD-10-CM | POA: Diagnosis not present

## 2016-04-29 DIAGNOSIS — E039 Hypothyroidism, unspecified: Secondary | ICD-10-CM | POA: Insufficient documentation

## 2016-04-29 DIAGNOSIS — N183 Chronic kidney disease, stage 3 (moderate): Secondary | ICD-10-CM | POA: Diagnosis not present

## 2016-04-29 DIAGNOSIS — W19XXXA Unspecified fall, initial encounter: Secondary | ICD-10-CM | POA: Diagnosis not present

## 2016-04-29 DIAGNOSIS — S0990XA Unspecified injury of head, initial encounter: Secondary | ICD-10-CM | POA: Diagnosis present

## 2016-04-29 DIAGNOSIS — S0101XA Laceration without foreign body of scalp, initial encounter: Secondary | ICD-10-CM | POA: Insufficient documentation

## 2016-04-29 DIAGNOSIS — Z951 Presence of aortocoronary bypass graft: Secondary | ICD-10-CM | POA: Insufficient documentation

## 2016-04-29 DIAGNOSIS — I2510 Atherosclerotic heart disease of native coronary artery without angina pectoris: Secondary | ICD-10-CM | POA: Diagnosis not present

## 2016-04-29 DIAGNOSIS — Z7982 Long term (current) use of aspirin: Secondary | ICD-10-CM | POA: Insufficient documentation

## 2016-04-29 DIAGNOSIS — Y939 Activity, unspecified: Secondary | ICD-10-CM | POA: Diagnosis not present

## 2016-04-29 DIAGNOSIS — Y929 Unspecified place or not applicable: Secondary | ICD-10-CM | POA: Diagnosis not present

## 2016-04-29 DIAGNOSIS — R6889 Other general symptoms and signs: Secondary | ICD-10-CM | POA: Diagnosis not present

## 2016-04-29 DIAGNOSIS — I129 Hypertensive chronic kidney disease with stage 1 through stage 4 chronic kidney disease, or unspecified chronic kidney disease: Secondary | ICD-10-CM | POA: Insufficient documentation

## 2016-04-29 DIAGNOSIS — W1800XA Striking against unspecified object with subsequent fall, initial encounter: Secondary | ICD-10-CM | POA: Insufficient documentation

## 2016-04-29 DIAGNOSIS — S0181XA Laceration without foreign body of other part of head, initial encounter: Secondary | ICD-10-CM | POA: Diagnosis not present

## 2016-04-29 DIAGNOSIS — I6523 Occlusion and stenosis of bilateral carotid arteries: Secondary | ICD-10-CM | POA: Diagnosis not present

## 2016-04-29 MED ORDER — TETANUS-DIPHTH-ACELL PERTUSSIS 5-2.5-18.5 LF-MCG/0.5 IM SUSP: 0.5000 mL | Freq: Once | INTRAMUSCULAR | Status: DC

## 2016-04-29 MED ORDER — LIDOCAINE-EPINEPHRINE-TETRACAINE (LET) SOLUTION
3.0000 mL | Freq: Once | NASAL | Status: AC
  Administered 2016-04-29: 20:00:00 3 mL via TOPICAL
  Filled 2016-04-29: qty 3

## 2016-04-29 NOTE — ED Notes (Signed)
MD informed that lac cart is at bedside and patient has had LET applied to her wounds

## 2016-04-29 NOTE — ED Provider Notes (Signed)
[facility] Emergency Department Provider Note    First MD Initiated Contact with Patient 04/29/16 1826     (approximate)  I have reviewed the triage vital signs and the nursing notes.   HISTORY  Chief Complaint Fall    HPI [name] is a 83 y.o. female with laceration to the back of her head as well as right elbow and right forearm after a fall from standing while she was bending over to get sheets and lost her balance. States that she started falling backwards and hit the back of her head. Does not recall any chest pain shortness of breath. Is amnestic to the initial event. Denies any numbness or tingling. Takes a baby aspirin. Denies any complaints at this time.   Past Medical History:  Diagnosis Date  . Anemia   . Chronic kidney disease    Stage 3  . CKD (chronic kidney disease), stage III   . GERD (gastroesophageal reflux disease)   . Heart disease   . History of stomach ulcers   . Hyperlipidemia   . Hypertension   . Hypothyroidism   . Lymphedema   . OA (osteoarthritis)   . Pneumonia   . Recurrent UTI    Family History  Problem Relation Age of Onset  . Kidney disease Neg Hx   . Bladder Cancer Neg Hx    Past Surgical History:  Procedure Laterality Date  . ABDOMINAL HYSTERECTOMY    . CORONARY ARTERY BYPASS GRAFT     Patient Active Problem List   Diagnosis Date Noted  . ASCVD (arteriosclerotic cardiovascular disease) 08/05/2015  . Hiatal hernia 08/05/2015  . Gastric ulcer 08/05/2015  . Hypertension 08/05/2015  . Adult hypothyroidism 08/05/2015  . Osteoarthritis 08/05/2015  . Recurrent UTI 04/04/2015  . Atrophic vaginitis 04/04/2015  . Incontinence 04/04/2015  . Benign hypertension with CKD (chronic kidney disease) stage III 10/16/2014  . Stage III chronic kidney disease 10/16/2014  . Dehydration 10/16/2014  . Sepsis (HCC) 09/22/2014  . Hypoxia 09/22/2014  . Healthcare-associated pneumonia 09/22/2014  . ARF (acute  renal failure) (HCC) 09/22/2014  . Lactic acid acidosis 09/22/2014  . Personal history of urinary infection 03/29/2014  . History of recurrent UTIs 03/29/2014  . Frequent falls 09/06/2013  . History of fall 09/06/2013  . Chronic insomnia 08/09/2013  . Ankle edema 09/30/2012  . Edema 09/30/2012  . CAD, ARTERY BYPASS GRAFT 06/21/2009  . HYPOTHYROIDISM 07/17/2008  . Hyperlipidemia 07/17/2008  . HTN (hypertension) 07/17/2008  . GERD (gastroesophageal reflux disease) 07/17/2008  . DYSPNEA 07/17/2008  . Chest pain 07/17/2008  . Essential hypertension 07/17/2008  . Other and unspecified hyperlipidemia 07/17/2008      Prior to Admission medications   Medication Sig Start Date End Date Taking? Authorizing Provider  ALPRAZolam Prudy Feeler) 1 MG tablet Take 1 tablet (1 mg total) by mouth at bedtime. For anxiety 01/30/16 01/29/17  Nita Sickle, MD  aspirin 81 MG EC tablet Take 81 mg by mouth daily.      Historical Provider, MD  conjugated estrogens (PREMARIN) vaginal cream Place 1 Applicatorful vaginally daily. Apply 0.5mg  (pea-sized amount)  just inside the vaginal introitus with a finger-tip every night for two weeks and then Monday, Wednesday and Friday nights. Patient not taking: Reported on 01/30/2016 05/02/15   [name] A McGowan, PA-C  Cranberry 500 MG CHEW Chew 1 tablet by mouth 2 (two) times daily.     Historical Provider, MD  estradiol (ESTRACE VAGINAL) 0.1 MG/GM vaginal cream Apply 0.5mg  (pea-sized amount)  just inside the vaginal introitus with a finger-tip every night for two weeks and then Monday, Wednesday and Friday nights. 09/09/15   [name] A McGowan, PA-C  gabapentin (NEURONTIN) 100 MG capsule Take 100-200 mg by mouth 3 (three) times daily. Take 100 mg by mouth morning and mid-day, then take 200 mg by mouth at bedtime.    Historical Provider, MD  hydrochlorothiazide (HYDRODIURIL) 12.5 MG tablet Take 1 tablet (12.5 mg total) by mouth daily. 03/18/16   Nita Sickle, MD    HYDROcodone-acetaminophen (NORCO) 5-325 MG tablet Take 1 tablet by mouth every 6 (six) hours as needed for moderate pain. Patient taking differently: Take 1 tablet by mouth every 8 (eight) hours as needed for moderate pain or severe pain.  01/30/16   Irean Hong, MD  levothyroxine (SYNTHROID, LEVOTHROID) 75 MCG tablet  07/30/15   Historical Provider, MD  metoprolol succinate (TOPROL-XL) 50 MG 24 hr tablet Take 50 mg by mouth daily.    Historical Provider, MD  mirtazapine (REMERON) 7.5 MG tablet Take 7.5 mg by mouth at bedtime.    Historical Provider, MD  MYRBETRIQ 50 MG TB24 tablet TAKE 1 TABLET BY MOUTH EVERY DAY 04/23/16   [name] A McGowan, PA-C  pantoprazole (PROTONIX) 40 MG tablet Take 40 mg by mouth daily.    Historical Provider, MD  potassium chloride SA (K-DUR,KLOR-CON) 20 MEQ tablet Take 20 mEq by mouth daily. 03/09/16 03/09/17  Historical Provider, MD  simvastatin (ZOCOR) 40 MG tablet Take 1 tablet (40 mg total) by mouth at bedtime. Patient taking differently: Take 40 mg by mouth daily.  10/16/15   Antonieta Iba, MD  sucralfate (CARAFATE) 1 G tablet Take 1 g by mouth daily.     Historical Provider, MD  vitamin B-12 (CYANOCOBALAMIN) 1000 MCG tablet Take 1,000 mcg by mouth every morning.     Historical Provider, MD    Allergies Augmentin [amoxicillin-pot clavulanate]; Iodine; and Tetanus toxoids    Social History Social History  Substance Use Topics  . Smoking status: Never Smoker  . Smokeless tobacco: Never Used  . Alcohol use No    Review of Systems Patient denies headaches, rhinorrhea, blurry vision, numbness, shortness of breath, chest pain, edema, cough, abdominal pain, nausea, vomiting, diarrhea, dysuria, fevers, rashes or hallucinations unless otherwise stated above in HPI. ____________________________________________   PHYSICAL EXAM:  VITAL SIGNS: Vitals:   04/29/16 1835  BP: (!) 169/75  Pulse: 69  Resp: 18  Temp: 98.4 F (36.9 C)    Constitutional: Alert  and oriented. Well appearing and in no acute distress. Eyes: Conjunctivae are normal. PERRL. EOMI. Head: Centimeter full-thickness laceration to the right posterior scalp without evidence of calvarial violation. Hemostatic. Nose: No congestion/rhinnorhea. Mouth/Throat: Mucous membranes are moist.  Oropharynx non-erythematous. Neck: No stridor. Painless ROM. No cervical spine tenderness to palpation Hematological/Lymphatic/Immunilogical: No cervical lymphadenopathy. Cardiovascular: Normal rate, regular rhythm. Grossly normal heart sounds.  Good peripheral circulation. Respiratory: Normal respiratory effort.  No retractions. Lungs CTAB. Gastrointestinal: Soft and nontender. No distention. No abdominal bruits. No CVA tenderness. Musculoskeletal: No lower extremity tenderness nor edema.  No joint effusions. Neurologic:  Normal speech and language. No gross focal neurologic deficits are appreciated. No gait instability. Skin:  Skin is warm, dry,  4 cm superficial skin tear/laceration of the right posterior forearm. Hemostatic. There is underlying hematoma. No bony tenderness to palpation. Psychiatric: Mood and affect are normal. Speech and behavior are normal.  ____________________________________________   LABS (all labs ordered are listed, but only abnormal results are  displayed)  No results found for this or any previous visit (from the past 24 hour(s)). ____________________________________________  ____________________________________________  RADIOLOGY  I personally reviewed all radiographic images ordered to evaluate for the above acute complaints and reviewed radiology reports and findings.  These findings were personally discussed with the patient.  Please see medical record for radiology report.  ____________________________________________   PROCEDURES  Procedure(s) performed:  Marland KitchenMarland KitchenLaceration Repair Date/Time: 04/29/2016 8:41 PM Performed by: Willy Eddy Authorized by:  Willy Eddy   Consent:    Consent obtained:  Verbal   Consent given by:  Patient   Risks discussed:  Infection, pain and poor cosmetic result Anesthesia (see MAR for exact dosages):    Anesthesia method:  Topical application and local infiltration   Topical anesthetic:  LET   Local anesthetic:  Lidocaine 1% w/o epi Laceration details:    Location:  Scalp   Scalp location:  R parietal   Length (cm):  2   Depth (mm):  4 Repair type:    Repair type:  Simple Exploration:    Wound exploration: wound explored through full range of motion   Treatment:    Area cleansed with:  Hibiclens   Amount of cleaning:  Standard Skin repair:    Repair method:  Staples   Number of staples:  6 Approximation:    Approximation:  Close   Vermilion border: well-aligned   Post-procedure details:    Dressing:  Open (no dressing)   Patient tolerance of procedure:  Tolerated well, no immediate complications .Marland KitchenLaceration Repair Date/Time: 04/29/2016 8:43 PM Performed by: Willy Eddy Authorized by: Willy Eddy   Consent:    Consent obtained:  Verbal   Consent given by:  Patient and parent   Risks discussed:  Infection, pain and poor cosmetic result Anesthesia (see MAR for exact dosages):    Anesthesia method:  Topical application   Topical anesthetic:  LET Laceration details:    Location:  Shoulder/arm   Shoulder/arm location:  R lower arm   Length (cm):  5   Depth (mm):  1 Repair type:    Repair type:  Simple Exploration:    Wound exploration: wound explored through full range of motion     Contaminated: no   Treatment:    Area cleansed with:  Hibiclens   Amount of cleaning:  Standard   Irrigation solution:  Sterile saline Skin repair:    Repair method:  Steri-Strips and tissue adhesive   Number of Steri-Strips:  5 Approximation:    Approximation:  Close   Vermilion border: well-aligned   Post-procedure details:    Dressing:  Open (no dressing)      Critical  Care performed: no ____________________________________________   INITIAL IMPRESSION / ASSESSMENT AND PLAN / ED COURSE  Pertinent labs & imaging results that were available during my care of the patient were reviewed by me and considered in my medical decision making (see chart for details).  DDX: ich, lac, concussion, fracture  [name] is a 83 y.o. who presents to the ED with fall from standing and lacerations as described above. CT imaging ordered to evaluate for acute traumatic injury shows none. No evidence of osseous fracture or injury to the right forearm. Lacerations repaired as above. Patient otherwise seemed dynamically stable. Stable for follow-up with PCP.      ____________________________________________   FINAL CLINICAL IMPRESSION(S) / ED DIAGNOSES  Final diagnoses:  Fall, initial encounter  Scalp laceration, initial encounter  Laceration of right upper extremity, initial encounter  NEW MEDICATIONS STARTED DURING THIS VISIT:  New Prescriptions   No medications on file     Note:  This document was prepared using Dragon voice recognition software and may include unintentional dictation errors.    Willy EddyPatrick Antone Summons, MD 04/29/16 2044

## 2016-04-29 NOTE — ED Notes (Signed)
Patient transported to CT 

## 2016-04-29 NOTE — ED Triage Notes (Signed)
[facility] , pt was bending over to get sheets and lost her balance , striking her head lac to posterior head, skin tear to right forearm ,

## 2016-05-07 DIAGNOSIS — L239 Allergic contact dermatitis, unspecified cause: Secondary | ICD-10-CM | POA: Diagnosis not present

## 2016-05-07 DIAGNOSIS — S41111D Laceration without foreign body of right upper arm, subsequent encounter: Secondary | ICD-10-CM | POA: Diagnosis not present

## 2016-05-07 DIAGNOSIS — I10 Essential (primary) hypertension: Secondary | ICD-10-CM | POA: Diagnosis not present

## 2016-05-07 DIAGNOSIS — Z4802 Encounter for removal of sutures: Secondary | ICD-10-CM | POA: Diagnosis not present

## 2016-05-16 NOTE — Progress Notes (Signed)
Cardiology Office Note  Date:  05/19/2016   ID:  [name], DOB [birth year], MRN [id number]  PCP:  Marguarite Arbour, MD   Chief Complaint  Patient presents with  . other    6 mo follow up.  Patient fell and was seen in ED on 3/21. Meds reviewed verbally with patient.    HPI:  83 year-old woman with a history of  CAD, bypass surgery in 14-Jun-2001,   cardiac catheterization 2008/06/14 showing occlusion of her native RCA and occlusion of her vein graft to the PDA with collateralized vessels from left to right, mild carotid arterial disease,  history of diverticular disease and significant GI bleeding.  history of falls.   who presents for routine followup of her coronary artery disease.   Daughter presents with patient today She currently lives at [facility]  Reaching for walker at home Seen in the ER 04/29/2016 for fall, lac to head x 8 and arm ER 03/18/16 for weakness, tremor,  ER 01/30/16 for fall, shoulder dislocation  Wakes at 5 AM Typically walks 2 miles every day Blood pressure numbers reviewed from [facility] notes, on average 120 up to 130 systolic  Lab work reviewed with her in detail Total chol 149, LDL 76   Previously had ankle edema, amlodipine held, torsemide increased briefly with improvement of her swelling  EKG personally reviewed by myself on todays visit Shows normal sinus rhythm rate 67 bpm no significant ST or T-wave changes  Other past medical history Previous issues with urinary tract infections On torsemide daily, creatinine climbed to 1.5  Currently walks with a walker  Prior lab work reviewed with her from June 2016 showing total cholesterol 137, LDL 67  Previously had lymphedema symptoms, improved with a pump She walks with a walker.  She's not doing any regular exercise.  Previous cortisone shots to her back and hips with significant improvement of her pain.  Her husband died In 06-15-2011. He died of complications while in the hospital.    episode  of chest pain in November 2014. She was evaluated in the hospital, ruled out for MI, had a stress test that showed no ischemia on 12/30/2012. She was started on sucralfate and symptoms have resolved.   Cholesterol higher at the end of 06/14/2012 when she was off her simvastatin. Total cholesterol that time 250.   Cholesterol in May 2014 was 212, up from 170 in 06/15/2011.  PMH:   has a past medical history of Anemia; Chronic kidney disease; CKD (chronic kidney disease), stage III; GERD (gastroesophageal reflux disease); Heart disease; History of stomach ulcers; Hyperlipidemia; Hypertension; Hypothyroidism; Lymphedema; OA (osteoarthritis); Pneumonia; and Recurrent UTI.  PSH:    Past Surgical History:  Procedure Laterality Date  . ABDOMINAL HYSTERECTOMY    . CORONARY ARTERY BYPASS GRAFT      Current Outpatient Prescriptions  Medication Sig Dispense Refill  . ALPRAZolam (XANAX) 1 MG tablet Take 1 tablet (1 mg total) by mouth at bedtime. For anxiety 7 tablet 0  . aspirin 81 MG EC tablet Take 81 mg by mouth daily.      Marland Kitchen conjugated estrogens (PREMARIN) vaginal cream Place 1 Applicatorful vaginally daily. Apply 0.5mg  (pea-sized amount)  just inside the vaginal introitus with a finger-tip every night for two weeks and then Monday, Wednesday and Friday nights. 30 g 12  . Cranberry 500 MG CHEW Chew 1 tablet by mouth 2 (two) times daily.     Marland Kitchen gabapentin (NEURONTIN) 100 MG capsule Take 100-200 mg by  mouth 3 (three) times daily. Take 100 mg by mouth morning and mid-day, then take 200 mg by mouth at bedtime.    . hydrochlorothiazide (HYDRODIURIL) 12.5 MG tablet Take 1 tablet (12.5 mg total) by mouth daily. 30 tablet 1  . HYDROcodone-acetaminophen (NORCO) 5-325 MG tablet Take 1 tablet by mouth every 6 (six) hours as needed for moderate pain. (Patient taking differently: Take 1 tablet by mouth every 8 (eight) hours as needed for moderate pain or severe pain. ) 20 tablet 0  . levothyroxine (SYNTHROID, LEVOTHROID) 75  MCG tablet     . metoprolol succinate (TOPROL-XL) 50 MG 24 hr tablet Take 50 mg by mouth daily.    . mirtazapine (REMERON) 7.5 MG tablet Take 7.5 mg by mouth at bedtime.    Marland Kitchen MYRBETRIQ 50 MG TB24 tablet TAKE 1 TABLET BY MOUTH EVERY DAY 30 tablet 3  . pantoprazole (PROTONIX) 40 MG tablet Take 40 mg by mouth daily.    . potassium chloride SA (K-DUR,KLOR-CON) 20 MEQ tablet Take 20 mEq by mouth daily.    . simvastatin (ZOCOR) 40 MG tablet Take 1 tablet (40 mg total) by mouth at bedtime. (Patient taking differently: Take 40 mg by mouth daily. ) 90 tablet 3  . sucralfate (CARAFATE) 1 G tablet Take 1 g by mouth daily.     . vitamin B-12 (CYANOCOBALAMIN) 1000 MCG tablet Take 1,000 mcg by mouth every morning.      No current facility-administered medications for this visit.      Allergies:   Augmentin [amoxicillin-pot clavulanate]; Iodine; and Tetanus toxoids   Social History:  The patient  reports that she has never smoked. She has never used smokeless tobacco. She reports that she does not drink alcohol or use drugs.   Family History:   family history is not on file.    Review of Systems: Review of Systems  Constitutional: Negative.   Respiratory: Negative.   Cardiovascular: Negative.   Gastrointestinal: Negative.   Musculoskeletal: Positive for falls.       Gait instability  Neurological: Negative.   Psychiatric/Behavioral: Negative.   All other systems reviewed and are negative.    PHYSICAL EXAM: VS:  BP 130/72 (BP Location: Right Arm, Patient Position: Sitting, Cuff Size: Normal)   Pulse 67   Ht  (1.575 m)   Wt 136 lb 12 oz (62 kg)   BMI 25.01 kg/m  , BMI Body mass index is 25.01 kg/m. GEN: Well nourished, well developed, in no acute distress , elderly, walks with a Walker HEENT: normal  Neck: no JVD, carotid bruits, or masses Cardiac: RRR; no murmurs, rubs, or gallops, trace pitting edema around the ankles Respiratory:  clear to auscultation bilaterally, normal work  of breathing GI: soft, nontender, nondistended, + BS MS: no deformity or atrophy  Skin: warm and dry, no rash Neuro:  Strength and sensation are intact Psych: euthymic mood, full affect    Recent Labs: 10/09/2015: ALT 16 03/18/2016: BUN 12; Creatinine, Ser 1.15; Hemoglobin 12.7; Platelets 186; Potassium 4.2; Sodium 141    Lipid Panel Lab Results  Component Value Date   CHOL 144 12/30/2012   HDL 58 12/30/2012   LDLCALC 60 12/30/2012   TRIG 128 12/30/2012      Wt Readings from Last 3 Encounters:  05/19/16 136 lb 12 oz (62 kg)  04/29/16 140 lb (63.5 kg)  03/18/16 133 lb (60.3 kg)       ASSESSMENT AND PLAN:  Hyperlipidemia Cholesterol is at goal on the  current lipid regimen. No changes to the medications were made.  Essential hypertension Blood pressure is well controlled on today's visit. No changes made to the medications.  Atherosclerosis of coronary artery bypass graft with unstable angina pectoris, unspecified whether native or transplanted heart [facility]) Currently with no symptoms of angina. No further workup at this time. Continue current medication regimen.  CABG, history of Done well for many years, denies any anginal symptoms Walks 2 miles daily at 5 AM  Ankle edema No significant swelling on today's visit, no changes to her medications Appears relatively euvolemic  Disposition:   F/U  6 months   Total encounter time more than 25 minutes  Greater than 50% was spent in counseling and coordination of care with the patient    Orders Placed This Encounter  Procedures  . EKG 12-Lead     Signed, Dossie Arbour, M.D., Ph.D. 05/19/2016  [facility] [place], [place] [phone]

## 2016-05-19 ENCOUNTER — Ambulatory Visit (INDEPENDENT_AMBULATORY_CARE_PROVIDER_SITE_OTHER): Payer: Medicare HMO | Admitting: Cardiovascular Disease

## 2016-05-19 ENCOUNTER — Encounter: Payer: Self-pay | Admitting: Cardiovascular Disease

## 2016-05-19 VITALS — BP 130/72 | HR 67 | Ht 62.0 in | Wt 136.8 lb

## 2016-05-19 DIAGNOSIS — E782 Mixed hyperlipidemia: Secondary | ICD-10-CM | POA: Diagnosis not present

## 2016-05-19 DIAGNOSIS — N183 Chronic kidney disease, stage 3 (moderate): Secondary | ICD-10-CM | POA: Diagnosis not present

## 2016-05-19 DIAGNOSIS — I129 Hypertensive chronic kidney disease with stage 1 through stage 4 chronic kidney disease, or unspecified chronic kidney disease: Secondary | ICD-10-CM | POA: Diagnosis not present

## 2016-05-19 DIAGNOSIS — I10 Essential (primary) hypertension: Secondary | ICD-10-CM | POA: Diagnosis not present

## 2016-05-19 DIAGNOSIS — I25708 Atherosclerosis of coronary artery bypass graft(s), unspecified, with other forms of angina pectoris: Secondary | ICD-10-CM

## 2016-05-19 DIAGNOSIS — I2510 Atherosclerotic heart disease of native coronary artery without angina pectoris: Secondary | ICD-10-CM | POA: Diagnosis not present

## 2016-05-19 DIAGNOSIS — R296 Repeated falls: Secondary | ICD-10-CM

## 2016-05-19 NOTE — Patient Instructions (Signed)

## 2016-06-29 DIAGNOSIS — Z961 Presence of intraocular lens: Secondary | ICD-10-CM | POA: Diagnosis not present

## 2016-07-01 DIAGNOSIS — E7800 Pure hypercholesterolemia, unspecified: Secondary | ICD-10-CM | POA: Diagnosis not present

## 2016-07-01 DIAGNOSIS — R011 Cardiac murmur, unspecified: Secondary | ICD-10-CM | POA: Diagnosis not present

## 2016-07-01 DIAGNOSIS — E039 Hypothyroidism, unspecified: Secondary | ICD-10-CM | POA: Diagnosis not present

## 2016-07-01 DIAGNOSIS — Z79899 Other long term (current) drug therapy: Secondary | ICD-10-CM | POA: Diagnosis not present

## 2016-07-01 DIAGNOSIS — R8290 Unspecified abnormal findings in urine: Secondary | ICD-10-CM | POA: Diagnosis not present

## 2016-07-01 DIAGNOSIS — I10 Essential (primary) hypertension: Secondary | ICD-10-CM | POA: Diagnosis not present

## 2016-07-01 DIAGNOSIS — I2510 Atherosclerotic heart disease of native coronary artery without angina pectoris: Secondary | ICD-10-CM | POA: Diagnosis not present

## 2016-07-09 DIAGNOSIS — R0602 Shortness of breath: Secondary | ICD-10-CM | POA: Diagnosis not present

## 2016-07-09 DIAGNOSIS — J209 Acute bronchitis, unspecified: Secondary | ICD-10-CM | POA: Diagnosis not present

## 2016-07-22 DIAGNOSIS — R011 Cardiac murmur, unspecified: Secondary | ICD-10-CM | POA: Diagnosis not present

## 2016-07-24 ENCOUNTER — Telehealth: Payer: Self-pay | Admitting: Urology

## 2016-07-24 NOTE — Telephone Encounter (Signed)
Pt's daughter, Eunice BlaseDebbie went to [facility] on [address] To pick up Nitrofurantoin mono-mcr 100 mg.  She went to pick up prescription and there were only 3 pills and pharmacy said that's all that were prescribed.  Please call daughter and let her know.  [phone]

## 2016-07-24 NOTE — Telephone Encounter (Signed)
Spoke with [facility] and they stated this RX was dated June 23rd 2017 and three pills was all it was written for. Patient has not been seen in a long time and I did not see this med on her list. Patient daughter transferred up front to make an appointment. Daughter states med just for maintenance. Appointment made for June 21st at 3:30.

## 2016-07-30 ENCOUNTER — Ambulatory Visit: Payer: Commercial Managed Care - HMO | Admitting: Urology

## 2016-08-08 IMAGING — CR DG SHOULDER 1V*R*
1 series · 1 of 1 positions shown · non-contrast
Comparison: Chest radiograph performed 02/14/2014

CLINICAL DATA: Acute onset of right shoulder pain. Initial
encounter.

EXAM:
RIGHT SHOULDER - 1 VIEW

[ap]
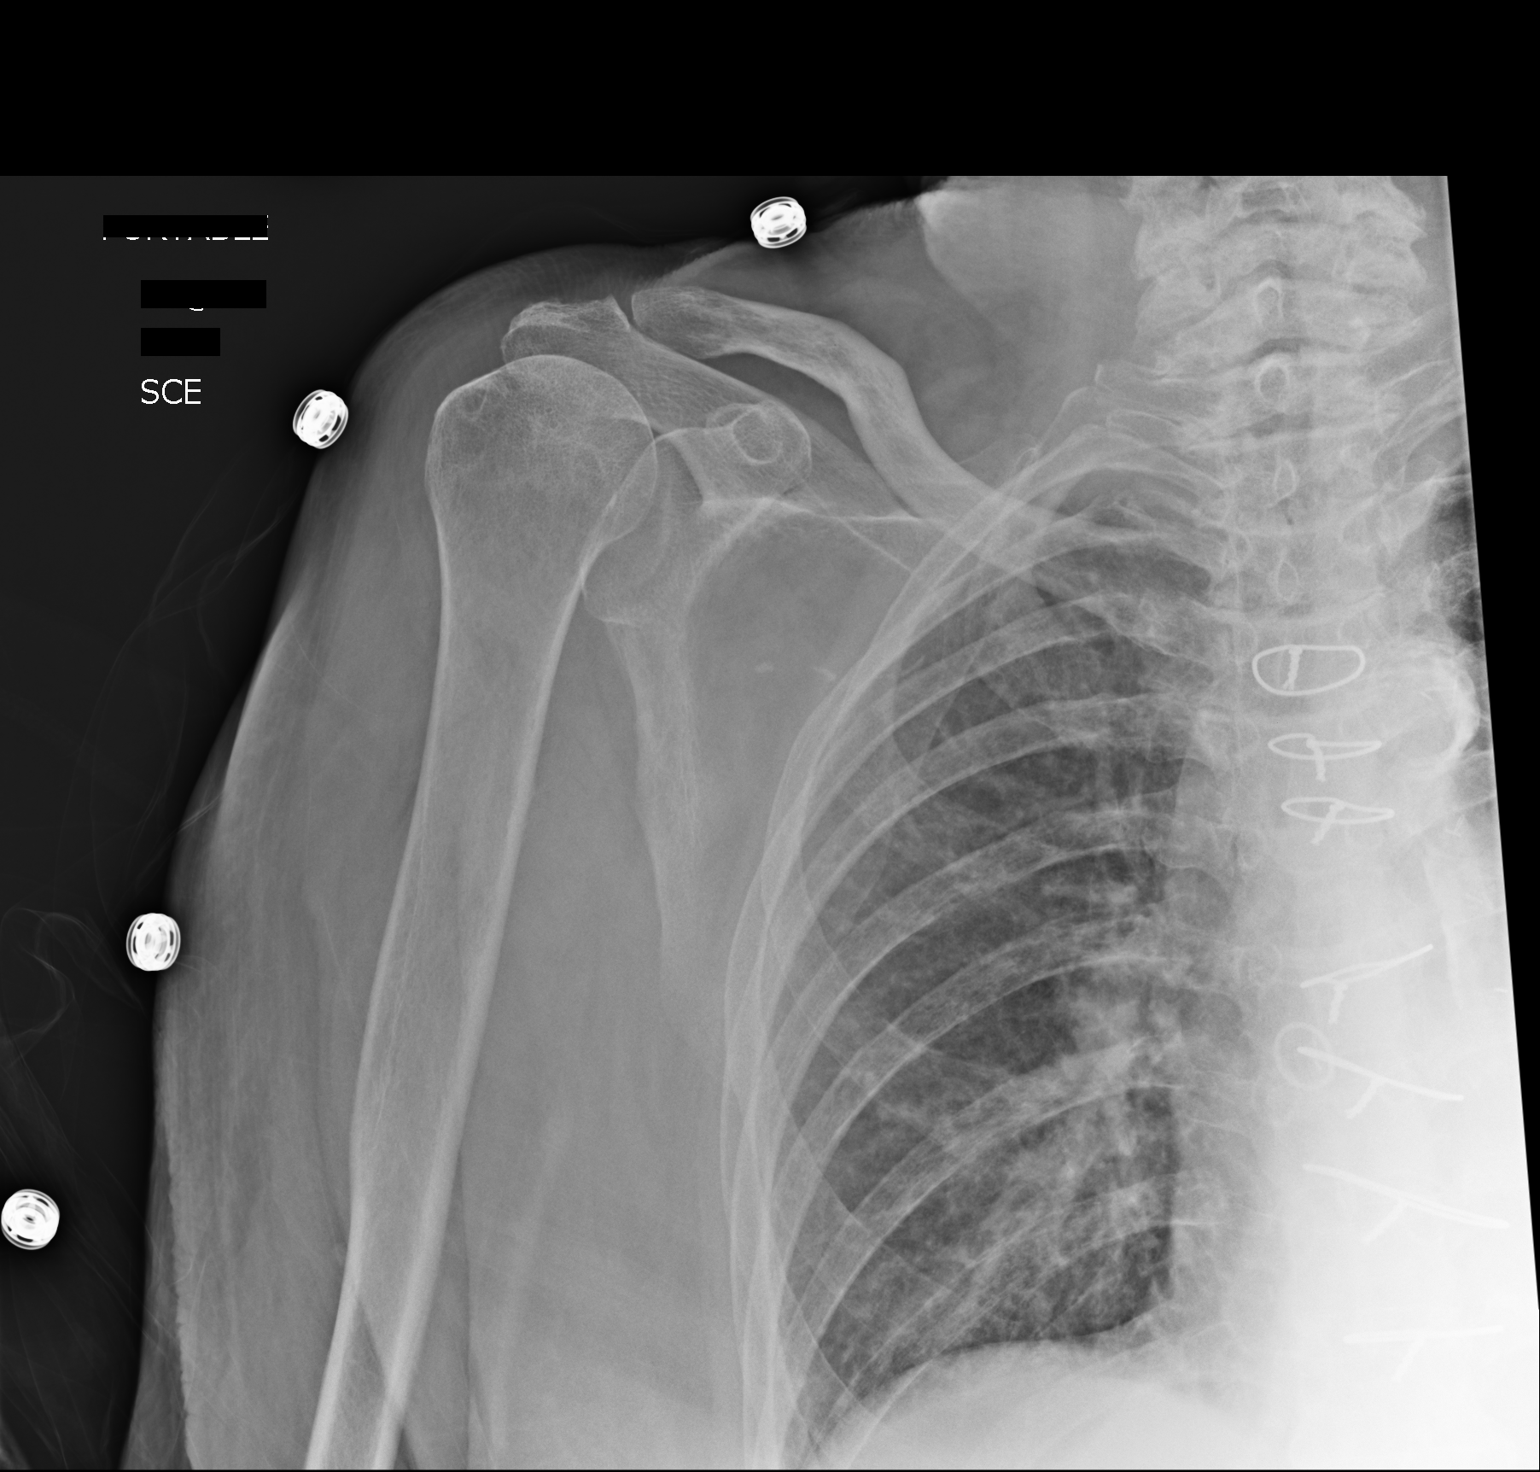

[1 of 1 positions shown; findings below may reference images not displayed]

FINDINGS: There is no evidence of fracture or dislocation. The right humeral
head appears to be seated within the glenoid fossa, though
evaluation is mildly suboptimal on a single frontal view. The
acromioclavicular joint is unremarkable in appearance. No
significant soft tissue abnormalities are seen. The visualized
portions of the right lung are clear. Mild vascular congestion is
noted. The patient is status post median sternotomy.
IMPRESSION: No evidence of fracture or dislocation.

## 2016-08-18 ENCOUNTER — Other Ambulatory Visit: Payer: Self-pay | Admitting: Urology

## 2016-08-18 DIAGNOSIS — N3281 Overactive bladder: Secondary | ICD-10-CM

## 2016-09-04 DIAGNOSIS — R600 Localized edema: Secondary | ICD-10-CM | POA: Diagnosis not present

## 2016-09-09 ENCOUNTER — Other Ambulatory Visit: Payer: Self-pay | Admitting: Cardiovascular Disease

## 2016-10-06 DIAGNOSIS — Z79899 Other long term (current) drug therapy: Secondary | ICD-10-CM | POA: Diagnosis not present

## 2016-10-06 DIAGNOSIS — N390 Urinary tract infection, site not specified: Secondary | ICD-10-CM | POA: Diagnosis not present

## 2016-10-06 DIAGNOSIS — E039 Hypothyroidism, unspecified: Secondary | ICD-10-CM | POA: Diagnosis not present

## 2016-10-06 DIAGNOSIS — E7800 Pure hypercholesterolemia, unspecified: Secondary | ICD-10-CM | POA: Diagnosis not present

## 2016-10-06 DIAGNOSIS — I10 Essential (primary) hypertension: Secondary | ICD-10-CM | POA: Diagnosis not present

## 2016-10-28 DIAGNOSIS — R0602 Shortness of breath: Secondary | ICD-10-CM | POA: Diagnosis not present

## 2016-10-28 DIAGNOSIS — R05 Cough: Secondary | ICD-10-CM | POA: Diagnosis not present

## 2016-11-19 DIAGNOSIS — R410 Disorientation, unspecified: Secondary | ICD-10-CM | POA: Diagnosis not present

## 2016-12-17 DIAGNOSIS — N390 Urinary tract infection, site not specified: Secondary | ICD-10-CM | POA: Diagnosis not present

## 2016-12-17 DIAGNOSIS — Z79899 Other long term (current) drug therapy: Secondary | ICD-10-CM | POA: Diagnosis not present

## 2016-12-17 DIAGNOSIS — R0602 Shortness of breath: Secondary | ICD-10-CM | POA: Diagnosis not present

## 2016-12-17 DIAGNOSIS — K219 Gastro-esophageal reflux disease without esophagitis: Secondary | ICD-10-CM | POA: Diagnosis not present

## 2016-12-17 DIAGNOSIS — E039 Hypothyroidism, unspecified: Secondary | ICD-10-CM | POA: Diagnosis not present

## 2016-12-17 DIAGNOSIS — I10 Essential (primary) hypertension: Secondary | ICD-10-CM | POA: Diagnosis not present

## 2017-01-10 NOTE — Progress Notes (Deleted)
Cardiology Office Note  Date:  01/10/2017   ID:  [name], DOB [birth year], MRN [id number]  PCP:  Marguarite Arbour, MD   No chief complaint on file.   HPI:  83 year-old woman with a history of  CAD, bypass surgery in 07/04/2001,   cardiac catheterization Jul 04, 2008 showing occlusion of her native RCA and occlusion of her vein graft to the PDA with collateralized vessels from left to right, mild carotid arterial disease,  diverticular disease and significant GI bleeding. falls.   who presents for routine followup of her coronary artery disease.   Daughter presents with patient today She currently lives at [facility]  Reaching for walker at home Seen in the ER 04/29/2016 for fall, lac to head x 8 and arm ER 03/18/16 for weakness, tremor,  ER 01/30/16 for fall, shoulder dislocation  Wakes at 5 AM Typically walks 2 miles every day Blood pressure numbers reviewed from [facility] notes, on average 120 up to 130 systolic  Lab work reviewed with her in detail Total chol 149, LDL 76   Previously had ankle edema, amlodipine held, torsemide increased briefly with improvement of her swelling  EKG personally reviewed by myself on todays visit Shows normal sinus rhythm rate 67 bpm no significant ST or T-wave changes  Other past medical history Previous issues with urinary tract infections On torsemide daily, creatinine climbed to 1.5  Currently walks with a walker  Prior lab work reviewed with her from June 2016 showing total cholesterol 137, LDL 67  Previously had lymphedema symptoms, improved with a pump She walks with a walker.  She's not doing any regular exercise.  Previous cortisone shots to her back and hips with significant improvement of her pain.  Her husband died In 2011-07-05. He died of complications while in the hospital.    episode of chest pain in November 2014. She was evaluated in the hospital, ruled out for MI, had a stress test that showed no ischemia on 12/30/2012.  She was started on sucralfate and symptoms have resolved.   Cholesterol higher at the end of 2012/07/04 when she was off her simvastatin. Total cholesterol that time 250.   Cholesterol in May 26, 2014was 212, up from 170 in 07/05/2011.  PMH:   has a past medical history of Anemia, Chronic kidney disease, CKD (chronic kidney disease), stage III, GERD (gastroesophageal reflux disease), Heart disease, History of stomach ulcers, Hyperlipidemia, Hypertension, Hypothyroidism, Lymphedema, OA (osteoarthritis), Pneumonia, and Recurrent UTI.  PSH:    Past Surgical History:  Procedure Laterality Date  . ABDOMINAL HYSTERECTOMY    . CORONARY ARTERY BYPASS GRAFT      Current Outpatient Medications  Medication Sig Dispense Refill  . ALPRAZolam (XANAX) 1 MG tablet Take 1 tablet (1 mg total) by mouth at bedtime. For anxiety 7 tablet 0  . aspirin 81 MG EC tablet Take 81 mg by mouth daily.      Marland Kitchen conjugated estrogens (PREMARIN) vaginal cream Place 1 Applicatorful vaginally daily. Apply 0.5mg  (pea-sized amount)  just inside the vaginal introitus with a finger-tip every night for two weeks and then Monday, Wednesday and Friday nights. 30 g 12  . Cranberry 500 MG CHEW Chew 1 tablet by mouth 2 (two) times daily.     Marland Kitchen gabapentin (NEURONTIN) 100 MG capsule Take 100-200 mg by mouth 3 (three) times daily. Take 100 mg by mouth morning and mid-day, then take 200 mg by mouth at bedtime.    . hydrochlorothiazide (HYDRODIURIL) 12.5 MG tablet Take  1 tablet (12.5 mg total) by mouth daily. 30 tablet 1  . HYDROcodone-acetaminophen (NORCO) 5-325 MG tablet Take 1 tablet by mouth every 6 (six) hours as needed for moderate pain. (Patient taking differently: Take 1 tablet by mouth every 8 (eight) hours as needed for moderate pain or severe pain. ) 20 tablet 0  . levothyroxine (SYNTHROID, LEVOTHROID) 75 MCG tablet     . metoprolol succinate (TOPROL-XL) 50 MG 24 hr tablet Take 50 mg by mouth daily.    . mirtazapine (REMERON) 7.5 MG tablet Take  7.5 mg by mouth at bedtime.    Marland Kitchen. MYRBETRIQ 50 MG TB24 tablet TAKE 1 TABLET BY MOUTH EVERY DAY 30 tablet 3  . pantoprazole (PROTONIX) 40 MG tablet Take 40 mg by mouth daily.    . potassium chloride SA (K-DUR,KLOR-CON) 20 MEQ tablet Take 20 mEq by mouth daily.    . simvastatin (ZOCOR) 40 MG tablet TAKE 1 TABLET AT BEDTIME 90 tablet 3  . sucralfate (CARAFATE) 1 G tablet Take 1 g by mouth daily.     . vitamin B-12 (CYANOCOBALAMIN) 1000 MCG tablet Take 1,000 mcg by mouth every morning.      No current facility-administered medications for this visit.      Allergies:   Augmentin [amoxicillin-pot clavulanate]; Iodine; and Tetanus toxoids   Social History:  The patient  reports that  has never smoked. she has never used smokeless tobacco. She reports that she does not drink alcohol or use drugs.   Family History:   family history is not on file.    Review of Systems: Review of Systems  Constitutional: Negative.   Respiratory: Negative.   Cardiovascular: Negative.   Gastrointestinal: Negative.   Musculoskeletal: Positive for falls.       Gait instability  Neurological: Negative.   Psychiatric/Behavioral: Negative.   All other systems reviewed and are negative.    PHYSICAL EXAM: VS:  There were no vitals taken for this visit. , BMI There is no height or weight on file to calculate BMI. GEN: Well nourished, well developed, in no acute distress , elderly, walks with a Walker HEENT: normal  Neck: no JVD, carotid bruits, or masses Cardiac: RRR; no murmurs, rubs, or gallops, trace pitting edema around the ankles Respiratory:  clear to auscultation bilaterally, normal work of breathing GI: soft, nontender, nondistended, + BS MS: no deformity or atrophy  Skin: warm and dry, no rash Neuro:  Strength and sensation are intact Psych: euthymic mood, full affect    Recent Labs: 03/18/2016: BUN 12; Creatinine, Ser 1.15; Hemoglobin 12.7; Platelets 186; Potassium 4.2; Sodium 141    Lipid  Panel Lab Results  Component Value Date   CHOL 144 12/30/2012   HDL 58 12/30/2012   LDLCALC 60 12/30/2012   TRIG 128 12/30/2012      Wt Readings from Last 3 Encounters:  05/19/16 136 lb 12 oz (62 kg)  04/29/16 140 lb (63.5 kg)  03/18/16 133 lb (60.3 kg)       ASSESSMENT AND PLAN:  Hyperlipidemia Cholesterol is at goal on the current lipid regimen. No changes to the medications were made.  Essential hypertension Blood pressure is well controlled on today's visit. No changes made to the medications.  Atherosclerosis of coronary artery bypass graft with unstable angina pectoris, unspecified whether native or transplanted heart [facility]) Currently with no symptoms of angina. No further workup at this time. Continue current medication regimen.  CABG, history of Done well for many years, denies any anginal symptoms  Walks 2 miles daily at 5 AM  Ankle edema No significant swelling on today's visit, no changes to her medications Appears relatively euvolemic  Disposition:   F/U  6 months   Total encounter time more than 25 minutes  Greater than 50% was spent in counseling and coordination of care with the patient    No orders of the defined types were placed in this encounter.    Signed, Dossie Arbourim Lynette Topete, M.D., Ph.D. 01/10/2017  [facility], [place] [phone]

## 2017-01-12 ENCOUNTER — Ambulatory Visit: Payer: Medicare HMO | Admitting: Cardiovascular Disease

## 2017-01-25 ENCOUNTER — Telehealth: Payer: Self-pay | Admitting: Cardiovascular Disease

## 2017-01-25 NOTE — Telephone Encounter (Signed)
Spoke with the patient's daughter, Eunice BlaseDebbie ([email or web address]  She called today with concerns about the patient gaining weight.  [facility]. [name] reports that the patient looks like she has gained about 10-15 lbs. [facility], they [facility] the patient weekly and she has only gained 4 lbs since September.  The patient recently had some wheezing and Dr. Judithann SheenSparks put her on an inhaler that has helped with her breathing.  The patient has HCTZ 12.5 mg daily & torsemide 10 mg daily as needed that she has to ask for from staff.  She did take a dose today.  She does have unilateral leg swelling that is relieved by elevation of her lower extremities.  Per the patient's daughter, the patient is currently in no distress. I advised her that her issue sounds to be more related to venous insufficiency. I inquired if Dr. Judithann SheenSparks follows her closely and she stated he did. Per the nursing facility, they will report any unusual changes to him.   I advised the patient's daughter, that I will forward to the MD to review. She is overdue for followup. Will forward to scheduling to please call the patient's daughter to reschedule follow up with Dr. Mariah MillingGollan.  She is agreeable.

## 2017-01-25 NOTE — Telephone Encounter (Signed)
Pt c/o swelling: STAT is pt has developed SOB within 24 hours  1) How much weight have you gained and in what time span? 15 lbs at least in 2 months   2) If swelling, where is the swelling located? Legs   3) Are you currently taking a fluid pill? yes  4) Are you currently SOB? Yes and  wheezing improved though with recent rx from Dr. Judithann SheenSparks for an inhaler   5) Do you have a log of your daily weights (if so, list)? [facility] unknown if weight tracked per daughter   6) Have you gained 3 pounds in a day or 5 pounds in a week? No   7) Have you traveled recently?  No

## 2017-01-25 NOTE — Telephone Encounter (Signed)
Does not sound like any significant change. Would continue current meds, Diuretics as needed for swelling or weight gain thx TG

## 2017-01-26 NOTE — Telephone Encounter (Signed)
I spoke with [name] to advise her of Dr. Windell HummingbirdGollan's recommendations.  She is agreeable.   Message to support pool to schedule follow up with Dr. Mariah MillingGollan next available.

## 2017-02-15 ENCOUNTER — Ambulatory Visit: Payer: Medicare HMO | Admitting: Cardiovascular Disease

## 2017-02-25 DIAGNOSIS — R410 Disorientation, unspecified: Secondary | ICD-10-CM | POA: Diagnosis not present

## 2017-02-25 DIAGNOSIS — R8290 Unspecified abnormal findings in urine: Secondary | ICD-10-CM | POA: Diagnosis not present

## 2017-02-26 ENCOUNTER — Telehealth: Payer: Self-pay | Admitting: Cardiovascular Disease

## 2017-02-26 NOTE — Telephone Encounter (Signed)
Pt c/o swelling: STAT is pt has developed SOB within 24 hours  1) How much weight have you gained and in what time span?  Looks bloated face stomach started noticing 2 wks ago   2) If swelling, where is the swelling located? BLE ankles to feet   3) Are you currently taking a fluid pill? Yes has one prn and she has been taking the past 2 days   4) Are you currently SOB? No   5) Do you have a log of your daily weights (if so, list)?  No not sure [facility]   6) Have you gained 3 pounds in a day or 5 pounds in a week? Not sure   7) Have you traveled recently? No patient has been elevating feet   Patient is starting to have pain in leg

## 2017-02-26 NOTE — Telephone Encounter (Signed)
Left voicemail message to call back  

## 2017-02-26 NOTE — Telephone Encounter (Signed)
[name] calling back to let us know patient has swelling up knees and she has skin peeling off   Patient now weighs 159 lbs and she normally weighs about 144 lbs

## 2017-02-26 NOTE — Telephone Encounter (Signed)
Spoke with patients daughter per release form and she reports that patient at facility has gained from 144 to 159 pounds with some pretty extreme swelling to her legs, ankles and feet. She reports that she is taking a fluid pill as needed that was ordered by Dr. Judithann SheenSparks. Reviewed with her that she may want to check with Dr. Judithann SheenSparks office to see if he would like to adjust medications. She reports patient is only weighed once weekly and instructions on medication were if she was up by 2 pounds. She verbalized understanding with no further questions at this time.

## 2017-03-01 DIAGNOSIS — I10 Essential (primary) hypertension: Secondary | ICD-10-CM | POA: Diagnosis not present

## 2017-03-01 DIAGNOSIS — R600 Localized edema: Secondary | ICD-10-CM | POA: Diagnosis not present

## 2017-03-01 NOTE — Telephone Encounter (Signed)
Pt daughter calling stating pt was told by Dr Judithann SheenSparks to call us States they did double up on her fluid pill and is not weighting 155  They are seeing Dr Judithann SheenSparks today  Would like to know if we can see them soon   Please advise

## 2017-03-01 NOTE — Telephone Encounter (Signed)
Needs office visit.

## 2017-03-01 NOTE — Telephone Encounter (Signed)
Left voicemail message to call back  

## 2017-03-02 ENCOUNTER — Telehealth: Payer: Self-pay | Admitting: Cardiovascular Disease

## 2017-03-02 NOTE — Telephone Encounter (Signed)
Pt daughter is concerned about pt coming to appt in the a.m. States pt takes a fluid pill and pt daughter husband will be coming with pt and will not be able to take pt to the bathroom. She asks if it is ok if pt take her fluid pill after she comes to her appt.

## 2017-03-02 NOTE — Telephone Encounter (Signed)
Spoke with patients daughter per release form and advised that patient should come in to be seen. She reports they have her on torsemide 4 times daily for 3 days. Recommended she come in for evaluation. Scheduled her to come in tomorrow to see Ward Givenshris Berge NP here in the office. She confirmed appointment information with no further questions at this time.

## 2017-03-02 NOTE — Telephone Encounter (Signed)
Spoke with patients daughter who states that she is disabled and will be unable to come with her to appointment tomorrow. She wants to know if it would be ok for her to hold the fluid pill until after her appointment to avoid any incontinent accidents. Advised that it would be up to her but she would need to make sure to take it after appointment. She verbalized understanding with no further questions at this time.

## 2017-03-03 ENCOUNTER — Telehealth: Payer: Self-pay | Admitting: Cardiovascular Disease

## 2017-03-03 ENCOUNTER — Encounter: Payer: Self-pay | Admitting: Nurse Practitioner

## 2017-03-03 ENCOUNTER — Ambulatory Visit: Payer: Medicare HMO | Admitting: Nurse Practitioner

## 2017-03-03 VITALS — BP 90/60 | HR 69 | Ht 63.0 in | Wt 151.0 lb

## 2017-03-03 DIAGNOSIS — I2510 Atherosclerotic heart disease of native coronary artery without angina pectoris: Secondary | ICD-10-CM | POA: Diagnosis not present

## 2017-03-03 DIAGNOSIS — I10 Essential (primary) hypertension: Secondary | ICD-10-CM

## 2017-03-03 DIAGNOSIS — N183 Chronic kidney disease, stage 3 unspecified: Secondary | ICD-10-CM

## 2017-03-03 DIAGNOSIS — E782 Mixed hyperlipidemia: Secondary | ICD-10-CM | POA: Diagnosis not present

## 2017-03-03 DIAGNOSIS — I5031 Acute diastolic (congestive) heart failure: Secondary | ICD-10-CM | POA: Diagnosis not present

## 2017-03-03 NOTE — Addendum Note (Signed)
Addended by: Sherri RadMCGHEE, HEATHER C on: 03/03/2017 11:33 AM   Modules accepted: Orders

## 2017-03-03 NOTE — Telephone Encounter (Signed)
[name] called from facility to clarify if the PRN dose should remain on [name] orders. Confirmed with Ward Erin Berge NP and they are to keep PRN dose listed but daily dose changed. She verbalized understanding with no further questions at this time.

## 2017-03-03 NOTE — Progress Notes (Signed)
Office Visit    Patient Name: [name] Date of Encounter: 03/03/2017  Primary Care Provider:  Marguarite ArbourSparks, Jeffrey D, MD Primary Cardiologist:  Julien Nordmannimothy Gollan, MD  Chief Complaint    84 y/o ? with a history of coronary artery disease status post CABG, hypertension, hyperlipidemia, and stage III chronic kidney disease who presents for follow-up related to lower extremity edema and weight gain.  Past Medical History    Past Medical History:  Diagnosis Date  . Anemia   . CAD (coronary artery disease)    a. 01/2002 s/p CABG x 4 (LIMA->LAD, VG->LCX, VG->pRPDA->dRPDA;  b. 2010 Cath: occlusions of the native RCA and seq VG->PDA w/ L->R collats-->Med Rx; c. 12/2012 MV: No ischemia.  . CKD (chronic kidney disease), stage III (HCC)   . GERD (gastroesophageal reflux disease)   . History of pneumonia   . History of stomach ulcers   . Hyperlipidemia   . Hypertension   . Hypothyroidism   . Lymphedema   . OA (osteoarthritis)   . Recurrent UTI    Past Surgical History:  Procedure Laterality Date  . ABDOMINAL HYSTERECTOMY    . CORONARY ARTERY BYPASS GRAFT      Allergies  Allergies  Allergen Reactions  . Amoxicillin-Pot Clavulanate Diarrhea    Pt c/o severe stomach pain with diarrhea  Pt c/o severe stomach pain with diarrhea   . Iodine Hives    Rash/hives  . Tetanus Toxoids Other (See Comments)    From nursing home MAR...side effects unknown  . Tetanus-Diphtheria Toxoids Td Rash    History of Present Illness    84 year old female with the above complex past medical history including coronary artery disease status post four-vessel bypass in December 2003.  Subsequent catheterization in 2010 revealed occlusions of the native right coronary artery and sequential vein graft to the proximal and distal RPDA.  She had a negative Myoview in 2014.  Echocardiogram at that time showed normal LV function.  Other history includes hypertension, hyperlipidemia, GERD, hypothyroidism, and  stage III chronic kidney disease.  She was last seen in clinic in April 2018, at which time she was doing well.  Since then, she reports continuing to do well.  She continues to walk about 2 miles every morning.  Though she has not noticed any change in exercise tolerance or chest pain, dyspnea, or palpitations, over the past 2 months, she has noted increasing lower extremity edema and over the past few weeks specifically, she has also been having increasing abdominal girth.  With this, she had weight gain up to 159 pounds as of this past Sunday.  In that setting, staff at her facility have provided her with torsemide 10 mg.  She was seen by primary care yesterday and placed on torsemide 20 mg twice daily times 3 days to be followed by torsemide 20 mg daily.  After just 2 days of additional torsemide, her weight is already down 8 pounds.  She continues to have mild increase in abdominal girth and lower extremity swelling, though she feels as though she is probably close to baseline fluid status.  Her weight generally runs in the high 140s-150.  She is 151 pounds on our scale today.  Home Medications    Prior to Admission medications   Medication Sig Start Date End Date Taking? Authorizing Provider  acetaminophen (TYLENOL) 325 MG tablet Take 650 mg by mouth every 4 (four) hours as needed.   Yes [provider]  ALPRAZolam Prudy Feeler(XANAX) 1 MG tablet  Take 1 mg by mouth at bedtime as needed for anxiety.   Yes [provider]  alum & mag hydroxide-simeth (MAALOX REGULAR STRENGTH) 200-200-20 MG/5ML suspension Take 30 mLs by mouth every 6 (six) hours as needed for indigestion or heartburn.   Yes [provider]  aspirin 81 MG EC tablet Take 81 mg by mouth daily.     Yes [provider]  budesonide-formoterol (SYMBICORT) 160-4.5 MCG/ACT inhaler Inhale 2 puffs into the lungs 2 (two) times daily.   Yes [provider]  conjugated estrogens (PREMARIN) vaginal cream Place 1  Applicatorful vaginally daily. Apply 0.5mg  (pea-sized amount)  just inside the vaginal introitus with a finger-tip every night for two weeks and then Monday, Wednesday and Friday nights. 05/02/15  Yes McGowan, [name] A, PA-C  Cranberry 500 MG CHEW Chew 1 tablet by mouth 2 (two) times daily.    Yes [provider]  gabapentin (NEURONTIN) 100 MG capsule Take 100-200 mg by mouth 3 (three) times daily. Take 100 mg by mouth morning and mid-day, then take 200 mg by mouth at bedtime.   Yes [provider]  hydrochlorothiazide (MICROZIDE) 12.5 MG capsule Take 12.5 mg by mouth daily.   Yes [provider]  levothyroxine (SYNTHROID, LEVOTHROID) 75 MCG tablet  07/30/15  Yes [provider]  loperamide (IMODIUM) 2 MG capsule Take 2 mg by mouth every 4 (four) hours as needed for diarrhea or loose stools.   Yes [provider]  metoprolol succinate (TOPROL-XL) 50 MG 24 hr tablet Take 50 mg by mouth daily.   Yes [provider]  mirtazapine (REMERON) 7.5 MG tablet Take 7.5 mg by mouth at bedtime.   Yes [provider]  MYRBETRIQ 50 MG TB24 tablet TAKE 1 TABLET BY MOUTH EVERY DAY 08/18/16  Yes McGowan, [name] A, PA-C  pantoprazole (PROTONIX) 40 MG tablet Take 40 mg by mouth 2 (two) times daily.   Yes [provider]  potassium chloride SA (K-DUR,KLOR-CON) 20 MEQ tablet Take 20 mEq by mouth daily. 03/09/16 03/09/17 Yes [provider]  simvastatin (ZOCOR) 40 MG tablet TAKE 1 TABLET AT BEDTIME 09/09/16  Yes Gollan, Tollie Pizza, MD  sucralfate (CARAFATE) 1 G tablet Take 1 g by mouth daily.    Yes [provider]  torsemide (DEMADEX) 20 MG tablet Take 20 mg by mouth daily.   Yes [provider]  vitamin B-12 (CYANOCOBALAMIN) 1000 MCG tablet Take 1,000 mcg by mouth every morning.    Yes [provider]      Review of Systems    Increased lower extremity edema, abdominal girth, and weight over the past few months and  more specifically in the past week or so.  She denies chest pain, palpitations, PND, orthopnea, dizziness, syncope, or early satiety.  All other systems reviewed and are otherwise negative except as noted above.  Physical Exam    VS:  BP 90/60 (BP Location: Left Arm, Patient Position: Sitting, Cuff Size: Normal)   Pulse 69   Ht 5\' 3"  (1.6 m)   Wt 151 lb (68.5 kg)   BMI 26.75 kg/m  , BMI Body mass index is 26.75 kg/m. GEN: Well nourished, well developed, in no acute distress.  HEENT: normal.  Neck: Supple, JVP approximately 10-12 cm, no carotid bruits, or masses. Cardiac: RRR, 2/6 systolic ejection murmur at the right upper sternal border, no rubs, or gallops. No clubbing, cyanosis, 2+ right ankle edema and 1+ left ankle edema.  Radials/DP/PT 2+ and equal bilaterally.  Respiratory:  Respirations regular and unlabored, clear to auscultation bilaterally. GI: Soft, nontender, nondistended, BS + x 4. MS: no deformity or atrophy. Skin: warm and dry, no rash. Neuro:  Strength and sensation are intact. Psych: Normal affect.  Accessory Clinical Findings    ECG -sinus rhythm with first-degree AV block, 69, PVC, nonspecific T changes.  Assessment & Plan    1.  Acute diastolic congestive heart failure: Patient presents for evaluation related to significant bilateral lower extremity edema, weight gain, and increasing abdominal girth over the past 2 months, and more specifically over the past week or so.  Weight peaked at 159 pounds this past Sunday and she is already down 8 pounds after receiving additional torsemide.  She continues to have moderate right greater than left lower extremity edema though she feels as though the appearance of her ankles is near baseline, as she does have some degree of chronic lower extremity edema.  Blood pressure is soft today at 90/60.  She was placed on torsemide 20 mg twice daily times 3 days beginning yesterday with a plan to continue it at 20 mg daily thereafter.   She is also on HCTZ 12.5 mg daily.  I do not think that she will need or tolerate 2 different diuretics and therefore, I am going to discontinue hydrochlorothiazide, especially in the setting of stage III kidney disease.  I have asked her to continue torsemide 20 twice daily just for today and instead of continuing that tomorrow, as previously ordered, I have recommended that she drop to 20 mg once daily.  I will arrange for follow-up echocardiogram.  EF was normal in 2014.  I will check a basic metabolic panel today given the fairly abrupt weight loss in the setting of torsemide.  2.  Coronary artery disease: She has not been having any chest pain and continues to walk approximately 2 miles every morning.  No dyspnea despite mild volume overload.  Continue low-dose aspirin and beta-blocker therapy.  Upon review of her medications from her facility, she is taking both simvastatin and pravastatin.  It is unclear why she would be on to statins.  I am going to discontinue her pravastatin.  3.  Essential hypertension: Blood pressure is soft today.  Discontinuing hydrochlorothiazide.  I will reduce her torsemide to 20 mg daily beginning tomorrow.  She is otherwise on beta-blocker and ARB therapy.  4.  Hyperlipidemia: LDL was 76 in December 2017.  Cont simva.  5.  GERD: Stable symptoms on twice daily Protonix.  6. CKD III:  F/u bmet today.  7.  Disposition: Follow-up basic metabolic panel today.  Follow-up echocardiogram.  We will plan to see her back for labs and office visit within the next 2 weeks.  Multiple medication adjustments as outlined above.  Specifically: 1.  Reduce torsemide to 20 mg daily beginning January 24. 2.  Discontinue hydrochlorothiazide.   Nicolasa Ducking, NP 03/03/2017, 11:22 AM

## 2017-03-03 NOTE — Telephone Encounter (Signed)
Left detailed voicemail message that we would be happy to assist with her mothers needs during visit to our office with instructions to call back if any further questions.

## 2017-03-03 NOTE — Telephone Encounter (Signed)
Patient daughter calling to report Patient received 20 mg of furosemide although it was requested to hold off this morning Patient daughter states she may have an accident and she will not be with her  She is sending fresh clothes but if it happens she asks if we will help change her since she has trouble

## 2017-03-03 NOTE — Telephone Encounter (Signed)
Please call regarding Torsemide order given today.

## 2017-03-03 NOTE — Patient Instructions (Addendum)
Medication Instructions: - Your physician has recommended you make the following change in your medication:  1) STOP hydrochlorothiazide (HCTZ) 2) STARTING tomorrow (03/04/17) - DECREASE torsemide 20 mg- take 1 tablet by mouth once daily  Labwork: - Your physician recommends that you have lab work today: [employer]  Procedures/Testing: - Your physician has requested that you have an echocardiogram (ASAP). Echocardiography is a painless test that uses sound waves to create images of your heart. It provides your doctor with information about the size and shape of your heart and how well your heart's chambers and valves are working. This procedure takes approximately one hour. There are no restrictions for this procedure.   Follow-Up: - Your physician recommends that you: keep your scheduled follow up with Dr. Mariah MillingGollan on 03/17/17.  Any Additional Special Instructions Will Be Listed Below (If Applicable).     If you need a refill on your cardiac medications before your next appointment, please call your pharmacy.

## 2017-03-04 ENCOUNTER — Other Ambulatory Visit: Payer: Self-pay | Admitting: *Deleted

## 2017-03-04 DIAGNOSIS — E876 Hypokalemia: Secondary | ICD-10-CM

## 2017-03-04 DIAGNOSIS — I10 Essential (primary) hypertension: Secondary | ICD-10-CM

## 2017-03-04 LAB — BASIC METABOLIC PANEL
BUN / CREAT RATIO: 13 (ref 12–28)
BUN: 21 mg/dL (ref 10–36)
CHLORIDE: 82 mmol/L — AB (ref 96–106)
CO2: 25 mmol/L (ref 20–29)
Calcium: 9.5 mg/dL (ref 8.7–10.3)
Creatinine, Ser: 1.61 mg/dL — ABNORMAL HIGH (ref 0.57–1.00)
GFR calc non Af Amer: 28 mL/min/{1.73_m2} — ABNORMAL LOW (ref >59)
GFR, EST AFRICAN AMERICAN: 32 mL/min/{1.73_m2} — AB (ref >59)
Glucose: 96 mg/dL (ref 65–99)
POTASSIUM: 3.3 mmol/L — AB (ref 3.5–5.2)
Sodium: 128 mmol/L — ABNORMAL LOW (ref 134–144)

## 2017-03-04 MED ORDER — TORSEMIDE 10 MG PO TABS: 10.0000 mg | ORAL_TABLET | Freq: Every day | ORAL | 3 refills | Status: DC

## 2017-03-08 ENCOUNTER — Other Ambulatory Visit: Payer: Self-pay

## 2017-03-08 ENCOUNTER — Other Ambulatory Visit
Admission: RE | Admit: 2017-03-08 | Discharge: 2017-03-08 | Disposition: A | Discharge status: Home / Self Care | Payer: Medicare HMO | Source: Ambulatory Visit | Attending: Nurse Practitioner | Admitting: Nurse Practitioner

## 2017-03-08 DIAGNOSIS — E876 Hypokalemia: Secondary | ICD-10-CM

## 2017-03-08 DIAGNOSIS — I10 Essential (primary) hypertension: Secondary | ICD-10-CM | POA: Insufficient documentation

## 2017-03-08 LAB — BASIC METABOLIC PANEL
Anion gap: 11 (ref 5–15)
BUN: 19 mg/dL (ref 6–20)
CO2: 30 mmol/L (ref 22–32)
CREATININE: 1.22 mg/dL — AB (ref 0.44–1.00)
Calcium: 9.4 mg/dL (ref 8.9–10.3)
Chloride: 92 mmol/L — ABNORMAL LOW (ref 101–111)
GFR calc Af Amer: 43 mL/min — ABNORMAL LOW (ref >60)
GFR calc non Af Amer: 37 mL/min — ABNORMAL LOW (ref >60)
Glucose, Bld: 100 mg/dL — ABNORMAL HIGH (ref 65–99)
Potassium: 3.1 mmol/L — ABNORMAL LOW (ref 3.5–5.1)
SODIUM: 133 mmol/L — AB (ref 135–145)

## 2017-03-08 MED ORDER — POTASSIUM CHLORIDE CRYS ER 20 MEQ PO TBCR: 40.0000 meq | EXTENDED_RELEASE_TABLET | Freq: Every day | ORAL | 11 refills | Status: DC

## 2017-03-08 NOTE — Progress Notes (Unsigned)
b

## 2017-03-09 DIAGNOSIS — B351 Tinea unguium: Secondary | ICD-10-CM | POA: Diagnosis not present

## 2017-03-09 DIAGNOSIS — M79675 Pain in left toe(s): Secondary | ICD-10-CM | POA: Diagnosis not present

## 2017-03-09 DIAGNOSIS — L97521 Non-pressure chronic ulcer of other part of left foot limited to breakdown of skin: Secondary | ICD-10-CM | POA: Diagnosis not present

## 2017-03-09 DIAGNOSIS — M79674 Pain in right toe(s): Secondary | ICD-10-CM | POA: Diagnosis not present

## 2017-03-12 ENCOUNTER — Ambulatory Visit (INDEPENDENT_AMBULATORY_CARE_PROVIDER_SITE_OTHER): Payer: Medicare HMO

## 2017-03-12 ENCOUNTER — Other Ambulatory Visit: Payer: Self-pay

## 2017-03-12 DIAGNOSIS — I2510 Atherosclerotic heart disease of native coronary artery without angina pectoris: Secondary | ICD-10-CM

## 2017-03-12 DIAGNOSIS — I5031 Acute diastolic (congestive) heart failure: Secondary | ICD-10-CM | POA: Diagnosis not present

## 2017-03-12 LAB — ECHOCARDIOGRAM COMPLETE
AOASC: 32 cm
AV Area VTI index: 1.26 cm2/m2
AV Area mean vel: 2.08 cm2
AV area mean vel ind: 1.21 cm2/m2
AVCELMEANRAT: 0.66
AVG: 3 mmHg
Area-P 1/2: 2.97 cm2
CHL CUP AV VEL: 2.17
DOP CAL AO MEAN VELOCITY: 89.3 cm/s
EERAT: 15.81
EWDT: 180 ms
FS: 32 % (ref 28–44)
IVS/LV PW RATIO, ED: 0.75
LA vol A4C: 46 ml
LA vol index: 28.2 mL/m2
LAVOL: 48.5 mL
LV TDI E'LATERAL: 8.92
LV e' LATERAL: 8.92 cm/s
LVEEAVG: 15.81
LVEEMED: 15.81
LVOT SV: 65 mL
LVOT VTI: 20.6 cm
LVOT area: 3.14 cm2
LVOT diameter: 20 mm
LVOTVTI: 0.69 cm
MV Dec: 180
MV Peak grad: 8 mmHg
MV pk A vel: 114 m/s
MV pk E vel: 141 m/s
P 1/2 time: 53 ms
PW: 8 mm — AB (ref 0.6–1.1)
RV LATERAL S' VELOCITY: 10.2 cm/s
Reg peak vel: 308 cm/s
TAPSE: 24.2 mm
TDI e' medial: 7.62
TRMAXVEL: 308 cm/s
VTI: 29.8 cm
Valve area index: 1.26
Valve area: 2.17 cm2

## 2017-03-16 NOTE — Progress Notes (Signed)
Cardiology Office Note  Date:  03/17/2017   ID:  [name], DOB 1922/03/31, MRN [id number]  PCP:  Marguarite Arbour, MD   Chief Complaint  Patient presents with  . other    6 month follow up. Meds reviewed by the pt.'s med. list. Pt. c/o LE edema that started about 3 weeks ago and is much better.     HPI:  84 year-old woman with a history of  CAD, bypass surgery in 06-28-2001,   cardiac catheterization 28-Jun-2008 showing occlusion of her native RCA and occlusion of her vein graft to the PDA with collateralized vessels from left to right, mild carotid arterial disease,  history of diverticular disease and significant GI bleeding.  history of falls.   who presents for routine followup of her coronary artery disease.   Family presents with patient today She currently lives at [facility]  No falls, since 04/2016 Weight up, eating more  Recent weight gain, worsening leg swelling Torsemide increased up to 20 daily, went into renal failure creatinine 1.6 low potassium Torsemide decreased back to 10 mg daily  Does not appear that potassium changes were made up to 40, repeat potassium last week 3.1  Notes indicating she is only getting torsemide 10 as needed but we called [facility] and they are giving 10 daily  Echocardiogram was performed last week reviewed with her in detail showing normal LV function, mildly elevated right heart pressures low 40s  EKG personally reviewed by myself on todays visit Shows normal sinus rhythm with rate 68 bpm rare APC  Other past medical history reviewed Previous fall , reaching for walker at home Seen in the ER 04/29/2016 for fall, lac to head x 8 and arm ER 03/18/16 for weakness, tremor,  ER 01/30/16 for fall, shoulder dislocation  Wakes at 5 AM Typically walks 2 miles every day  Total chol 149, LDL 76   Previously had ankle edema, amlodipine held, torsemide increased briefly with improvement of her swelling  Previous issues with urinary tract  infections On torsemide daily, creatinine climbed to 1.5  Currently walks with a walker  Prior lab work reviewed with her from June 2016 showing total cholesterol 137, LDL 67  Previously had lymphedema symptoms, improved with a pump She walks with a walker.  She's not doing any regular exercise.  Previous cortisone shots to her back and hips with significant improvement of her pain.  Her husband died In 06-29-11. He died of complications while in the hospital.    episode of chest pain in November 2014. She was evaluated in the hospital, ruled out for MI, had a stress test that showed no ischemia on 12/30/2012. She was started on sucralfate and symptoms have resolved.   Cholesterol higher at the end of Jun 28, 2012 when she was off her simvastatin. Total cholesterol that time 250.   Cholesterol in 05/20/14was 212, up from 170 in 06-29-11.  PMH:   has a past medical history of Anemia, CAD (coronary artery disease), CKD (chronic kidney disease), stage III (HCC), GERD (gastroesophageal reflux disease), History of pneumonia, History of stomach ulcers, Hyperlipidemia, Hypertension, Hypothyroidism, Lymphedema, OA (osteoarthritis), and Recurrent UTI.  PSH:    Past Surgical History:  Procedure Laterality Date  . ABDOMINAL HYSTERECTOMY    . CORONARY ARTERY BYPASS GRAFT      Current Outpatient Medications  Medication Sig Dispense Refill  . acetaminophen (TYLENOL) 325 MG tablet Take 650 mg by mouth every 4 (four) hours as needed.    Marland Kitchen  ALPRAZolam (XANAX) 1 MG tablet Take 1 mg by mouth at bedtime as needed for anxiety.    Marland Kitchen. alum & mag hydroxide-simeth (MAALOX REGULAR STRENGTH) 200-200-20 MG/5ML suspension Take 30 mLs by mouth every 6 (six) hours as needed for indigestion or heartburn.    Marland Kitchen. aspirin 81 MG tablet Take 81 mg by mouth daily.    . budesonide-formoterol (SYMBICORT) 160-4.5 MCG/ACT inhaler Inhale 2 puffs into the lungs 2 (two) times daily.    . Cranberry 500 MG CHEW Chew 1 tablet by mouth 2 (two)  times daily.     Marland Kitchen. gabapentin (NEURONTIN) 100 MG capsule Take 100-200 mg by mouth 3 (three) times daily. Take 100 mg by mouth morning and mid-day, then take 200 mg by mouth at bedtime.    . hydrochlorothiazide (MICROZIDE) 12.5 MG capsule Take 12.5 mg by mouth daily.    Marland Kitchen. levothyroxine (SYNTHROID, LEVOTHROID) 75 MCG tablet     . loperamide (IMODIUM) 2 MG capsule Take 2 mg by mouth every 4 (four) hours as needed for diarrhea or loose stools.    . metoprolol succinate (TOPROL-XL) 50 MG 24 hr tablet Take 50 mg by mouth daily.    . mirtazapine (REMERON) 7.5 MG tablet Take 7.5 mg by mouth at bedtime.    Marland Kitchen. MYRBETRIQ 50 MG TB24 tablet TAKE 1 TABLET BY MOUTH EVERY DAY 30 tablet 3  . pantoprazole (PROTONIX) 40 MG tablet Take 40 mg by mouth 2 (two) times daily.    . potassium chloride SA (K-DUR,KLOR-CON) 20 MEQ tablet Take 20 mEq by mouth daily.    . simvastatin (ZOCOR) 40 MG tablet TAKE 1 TABLET AT BEDTIME 90 tablet 3  . sucralfate (CARAFATE) 1 G tablet Take 1 g by mouth daily.     Marland Kitchen. torsemide (DEMADEX) 10 MG tablet Take 10 mg by mouth as needed.     . vitamin B-12 (CYANOCOBALAMIN) 1000 MCG tablet Take 1,000 mcg by mouth every morning.      No current facility-administered medications for this visit.      Allergies:   Amoxicillin-pot clavulanate; Iodine; Tetanus toxoids; and Tetanus-diphtheria toxoids td   Social History:  The patient  reports that  has never smoked. she has never used smokeless tobacco. She reports that she does not drink alcohol or use drugs.   Family History:   family history is not on file.    Review of Systems: Review of Systems  Constitutional: Negative.   Respiratory: Negative.   Cardiovascular: Positive for leg swelling.  Gastrointestinal: Negative.   Musculoskeletal:       Gait instability  Neurological: Negative.   Psychiatric/Behavioral: Negative.   All other systems reviewed and are negative.    PHYSICAL EXAM: VS:  BP 120/70 (BP Location: Left Arm, Patient  Position: Sitting, Cuff Size: Normal)   Pulse 68   Ht 5\' 3"  (1.6 m)   Wt 154 lb 4 oz (70 kg)   BMI 27.32 kg/m  , BMI Body mass index is 27.32 kg/m. GEN: Well nourished, well developed, in no acute distress , elderly, walks with a Walker HEENT: normal  Neck: no JVD, carotid bruits, or masses Cardiac: RRR; no murmurs, rubs, or gallops, trace to 1+pitting edema around the ankles Respiratory:  clear to auscultation bilaterally, normal work of breathing GI: soft, nontender, nondistended, + BS MS: no deformity or atrophy  Skin: warm and dry, no rash Neuro:  Strength and sensation are intact Psych: euthymic mood, full affect    Recent Labs: 03/18/2016: Hemoglobin 12.7; Platelets  186 03/08/2017: BUN 19; Creatinine, Ser 1.22; Potassium 3.1; Sodium 133    Lipid Panel Lab Results  Component Value Date   CHOL 144 12/30/2012   HDL 58 12/30/2012   LDLCALC 60 12/30/2012   TRIG 128 12/30/2012      Wt Readings from Last 3 Encounters:  03/17/17 154 lb 4 oz (70 kg)  03/03/17 151 lb (68.5 kg)  05/19/16 136 lb 12 oz (62 kg)       ASSESSMENT AND PLAN:  Hyperlipidemia Cholesterol is at goal on the current lipid regimen. No changes to the medications were made.  Stable  Essential hypertension Blood pressure is well controlled on today's visit. No changes made to the medications. No changes/stable  Atherosclerosis of coronary artery bypass graft with unstable angina pectoris, unspecified whether native or transplanted heart [facility]) Currently with no symptoms of angina. No further workup at this time. Continue current medication regimen.  Stable  CABG, history of Done well for many years, denies any anginal symptoms Walks 2 miles daily at 5 AM Stable, normal ejection fraction greater than 55%   Chronic diastolic CHF Chronic lower extremity edema likely out of proportion to her cardiac disease Trace pitting edema around the ankles bilaterally We will continue torsemide 10 daily Most  recent weight low 150 Recommend we give extra torsemide 10 as needed for weight 154 pounds or higher We will continue potassium 40 daily Currently only receiving 20 daily.  Most recent potassium level 3.1 Likely low from torsemide and HCTZ  Ankle edema Trace lower extremity ankle swelling pitting Right heart pressures mildly elevated on echo  Disposition:   F/U  6 months  Numerous time spent calling [facility] to determine medication list, weight, other items Final instructions and new orders have been detailed, faxed back to the nursing home  Total encounter time more than 45 minutes  Greater than 50% was spent in counseling and coordination of care with the patient    Orders Placed This Encounter  Procedures  . EKG 12-Lead     Signed, Dossie Arbour, M.D., Ph.D. 03/17/2017  [facility] [place], [place] [phone]

## 2017-03-17 ENCOUNTER — Ambulatory Visit: Payer: Medicare HMO | Admitting: Cardiovascular Disease

## 2017-03-17 ENCOUNTER — Encounter: Payer: Self-pay | Admitting: Cardiovascular Disease

## 2017-03-17 VITALS — BP 120/70 | HR 68 | Ht 63.0 in | Wt 154.2 lb

## 2017-03-17 DIAGNOSIS — R7989 Other specified abnormal findings of blood chemistry: Secondary | ICD-10-CM

## 2017-03-17 DIAGNOSIS — R296 Repeated falls: Secondary | ICD-10-CM | POA: Diagnosis not present

## 2017-03-17 DIAGNOSIS — E782 Mixed hyperlipidemia: Secondary | ICD-10-CM

## 2017-03-17 DIAGNOSIS — R945 Abnormal results of liver function studies: Secondary | ICD-10-CM | POA: Diagnosis not present

## 2017-03-17 DIAGNOSIS — I25708 Atherosclerosis of coronary artery bypass graft(s), unspecified, with other forms of angina pectoris: Secondary | ICD-10-CM

## 2017-03-17 DIAGNOSIS — I5031 Acute diastolic (congestive) heart failure: Secondary | ICD-10-CM | POA: Diagnosis not present

## 2017-03-17 DIAGNOSIS — I10 Essential (primary) hypertension: Secondary | ICD-10-CM

## 2017-03-17 NOTE — Patient Instructions (Signed)
Medication Instructions:   Please increase the potassium to 40 meq daily Continue torsemide 10 daily Monitor weight If weight is 154 or higher, Give extra torsemide 10 mg x 1   Labwork:  No new labs needed  Testing/Procedures:  No further testing at this time   Follow-Up: It was a pleasure seeing you in the office today. Please call us if you have new issues that need to be addressed before your next appt.  [phone]  Your physician wants you to follow-up in: 6 months.  You will receive a reminder letter in the mail two months in advance. If you don't receive a letter, please call our office to schedule the follow-up appointment.  If you need a refill on your cardiac medications before your next appointment, please call your pharmacy.

## 2017-03-17 NOTE — Progress Notes (Signed)
[facility] given Torsemide 10 mg once daily. Weights reported as follows: 03/01/17 154 03/07/17 151 03/14/17 150 Reviewed information with Dr. Mariah MillingGollan and communication form faxed over to facility.

## 2017-03-19 DIAGNOSIS — Z87440 Personal history of urinary (tract) infections: Secondary | ICD-10-CM | POA: Diagnosis not present

## 2017-03-19 DIAGNOSIS — E039 Hypothyroidism, unspecified: Secondary | ICD-10-CM | POA: Diagnosis not present

## 2017-03-19 DIAGNOSIS — I2510 Atherosclerotic heart disease of native coronary artery without angina pectoris: Secondary | ICD-10-CM | POA: Diagnosis not present

## 2017-03-19 DIAGNOSIS — R509 Fever, unspecified: Secondary | ICD-10-CM | POA: Diagnosis not present

## 2017-03-19 DIAGNOSIS — Z0000 Encounter for general adult medical examination without abnormal findings: Secondary | ICD-10-CM | POA: Diagnosis not present

## 2017-03-19 DIAGNOSIS — I10 Essential (primary) hypertension: Secondary | ICD-10-CM | POA: Diagnosis not present

## 2017-03-19 DIAGNOSIS — Z79899 Other long term (current) drug therapy: Secondary | ICD-10-CM | POA: Diagnosis not present

## 2017-03-19 DIAGNOSIS — E7800 Pure hypercholesterolemia, unspecified: Secondary | ICD-10-CM | POA: Diagnosis not present

## 2017-05-05 DIAGNOSIS — G629 Polyneuropathy, unspecified: Secondary | ICD-10-CM | POA: Diagnosis not present

## 2017-05-05 DIAGNOSIS — R5381 Other malaise: Secondary | ICD-10-CM | POA: Diagnosis not present

## 2017-05-05 DIAGNOSIS — N3281 Overactive bladder: Secondary | ICD-10-CM | POA: Diagnosis not present

## 2017-05-05 DIAGNOSIS — I2510 Atherosclerotic heart disease of native coronary artery without angina pectoris: Secondary | ICD-10-CM | POA: Diagnosis not present

## 2017-05-05 DIAGNOSIS — I10 Essential (primary) hypertension: Secondary | ICD-10-CM | POA: Diagnosis not present

## 2017-05-05 DIAGNOSIS — G8929 Other chronic pain: Secondary | ICD-10-CM | POA: Diagnosis not present

## 2017-05-05 DIAGNOSIS — E039 Hypothyroidism, unspecified: Secondary | ICD-10-CM | POA: Diagnosis not present

## 2017-05-05 DIAGNOSIS — E785 Hyperlipidemia, unspecified: Secondary | ICD-10-CM | POA: Diagnosis not present

## 2017-05-05 DIAGNOSIS — R609 Edema, unspecified: Secondary | ICD-10-CM | POA: Diagnosis not present

## 2017-05-06 DIAGNOSIS — R531 Weakness: Secondary | ICD-10-CM | POA: Diagnosis not present

## 2017-05-06 DIAGNOSIS — R609 Edema, unspecified: Secondary | ICD-10-CM | POA: Diagnosis not present

## 2017-05-06 DIAGNOSIS — E860 Dehydration: Secondary | ICD-10-CM | POA: Diagnosis not present

## 2017-05-06 DIAGNOSIS — E785 Hyperlipidemia, unspecified: Secondary | ICD-10-CM | POA: Diagnosis not present

## 2017-05-10 DIAGNOSIS — N390 Urinary tract infection, site not specified: Secondary | ICD-10-CM | POA: Diagnosis not present

## 2017-05-13 DIAGNOSIS — F329 Major depressive disorder, single episode, unspecified: Secondary | ICD-10-CM | POA: Diagnosis not present

## 2017-05-13 DIAGNOSIS — I2510 Atherosclerotic heart disease of native coronary artery without angina pectoris: Secondary | ICD-10-CM | POA: Diagnosis not present

## 2017-05-13 DIAGNOSIS — N390 Urinary tract infection, site not specified: Secondary | ICD-10-CM | POA: Diagnosis not present

## 2017-05-13 DIAGNOSIS — N183 Chronic kidney disease, stage 3 (moderate): Secondary | ICD-10-CM | POA: Diagnosis not present

## 2017-05-13 DIAGNOSIS — R630 Anorexia: Secondary | ICD-10-CM | POA: Diagnosis not present

## 2017-05-13 DIAGNOSIS — J449 Chronic obstructive pulmonary disease, unspecified: Secondary | ICD-10-CM | POA: Diagnosis not present

## 2017-05-13 DIAGNOSIS — E039 Hypothyroidism, unspecified: Secondary | ICD-10-CM | POA: Diagnosis not present

## 2017-05-13 DIAGNOSIS — I10 Essential (primary) hypertension: Secondary | ICD-10-CM | POA: Diagnosis not present

## 2017-05-21 DIAGNOSIS — G629 Polyneuropathy, unspecified: Secondary | ICD-10-CM | POA: Diagnosis not present

## 2017-05-21 DIAGNOSIS — E039 Hypothyroidism, unspecified: Secondary | ICD-10-CM | POA: Diagnosis not present

## 2017-05-21 DIAGNOSIS — N183 Chronic kidney disease, stage 3 (moderate): Secondary | ICD-10-CM | POA: Diagnosis not present

## 2017-05-21 DIAGNOSIS — N3281 Overactive bladder: Secondary | ICD-10-CM | POA: Diagnosis not present

## 2017-05-21 DIAGNOSIS — R609 Edema, unspecified: Secondary | ICD-10-CM | POA: Diagnosis not present

## 2017-05-21 DIAGNOSIS — G8929 Other chronic pain: Secondary | ICD-10-CM | POA: Diagnosis not present

## 2017-05-21 DIAGNOSIS — I2510 Atherosclerotic heart disease of native coronary artery without angina pectoris: Secondary | ICD-10-CM | POA: Diagnosis not present

## 2017-05-21 DIAGNOSIS — E785 Hyperlipidemia, unspecified: Secondary | ICD-10-CM | POA: Diagnosis not present

## 2017-05-21 DIAGNOSIS — I10 Essential (primary) hypertension: Secondary | ICD-10-CM | POA: Diagnosis not present

## 2017-06-28 DIAGNOSIS — B353 Tinea pedis: Secondary | ICD-10-CM | POA: Diagnosis not present

## 2017-06-29 DIAGNOSIS — N390 Urinary tract infection, site not specified: Secondary | ICD-10-CM | POA: Diagnosis not present

## 2017-07-19 DIAGNOSIS — N183 Chronic kidney disease, stage 3 (moderate): Secondary | ICD-10-CM | POA: Diagnosis not present

## 2017-07-19 DIAGNOSIS — Z951 Presence of aortocoronary bypass graft: Secondary | ICD-10-CM | POA: Diagnosis not present

## 2017-07-19 DIAGNOSIS — G8929 Other chronic pain: Secondary | ICD-10-CM | POA: Diagnosis not present

## 2017-07-19 DIAGNOSIS — I2510 Atherosclerotic heart disease of native coronary artery without angina pectoris: Secondary | ICD-10-CM | POA: Diagnosis not present

## 2017-07-19 DIAGNOSIS — F329 Major depressive disorder, single episode, unspecified: Secondary | ICD-10-CM | POA: Diagnosis not present

## 2017-07-19 DIAGNOSIS — I10 Essential (primary) hypertension: Secondary | ICD-10-CM | POA: Diagnosis not present

## 2017-07-19 DIAGNOSIS — E785 Hyperlipidemia, unspecified: Secondary | ICD-10-CM | POA: Diagnosis not present

## 2017-07-19 DIAGNOSIS — E039 Hypothyroidism, unspecified: Secondary | ICD-10-CM | POA: Diagnosis not present

## 2017-09-03 IMAGING — CR DG ELBOW COMPLETE 3+V*L*
4 series · 4 of 4 positions shown · non-contrast
Comparison: None

CLINICAL DATA: Fall

EXAM:
LEFT ELBOW - COMPLETE 3+ VIEW

[elbow ap]
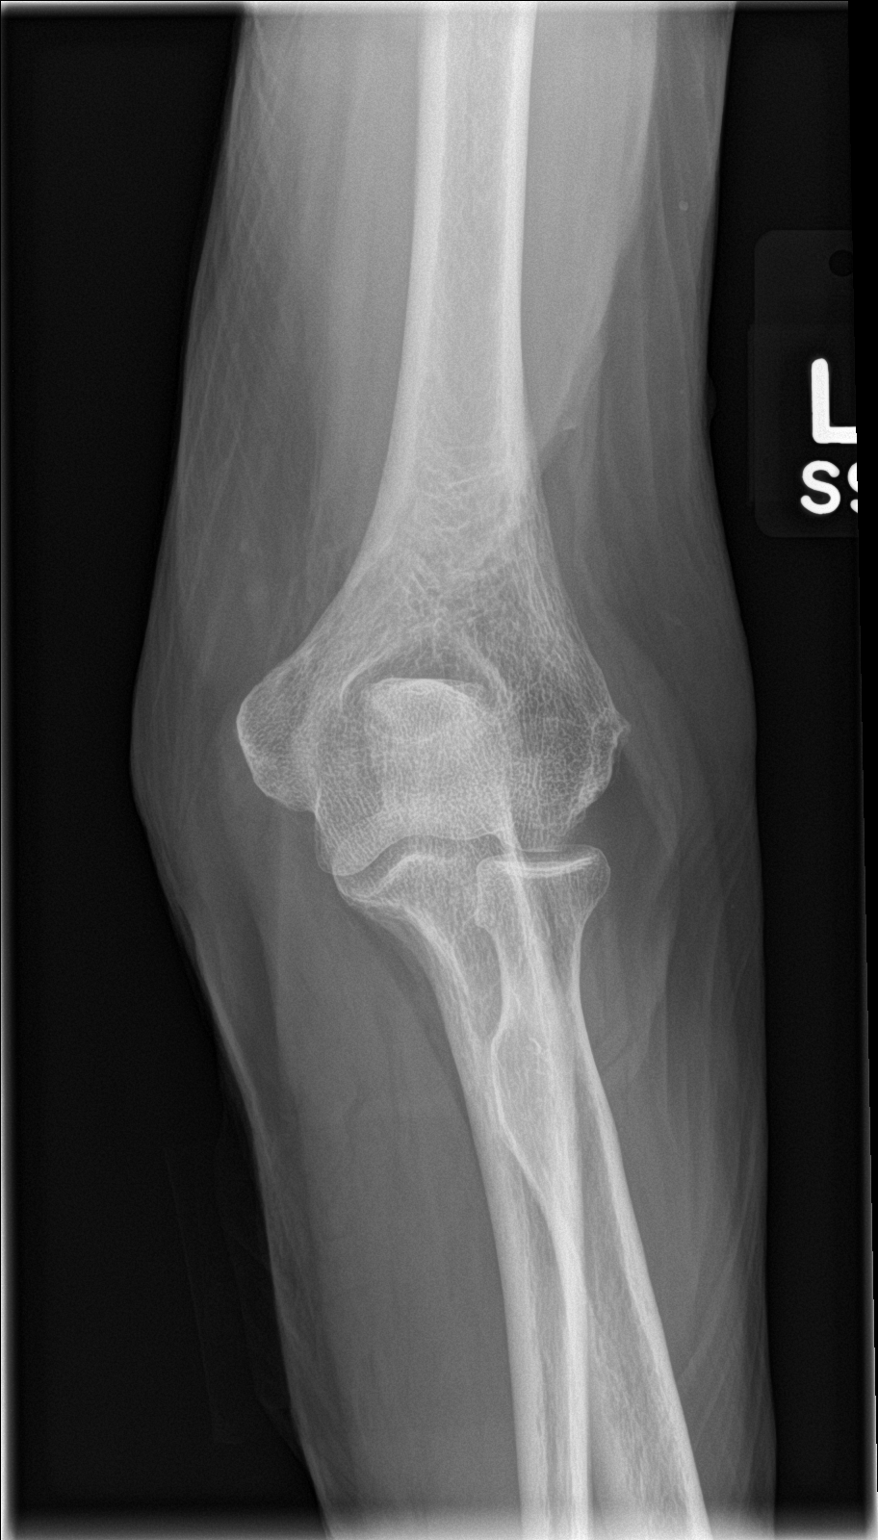

[elbow obl (1 of 2)]
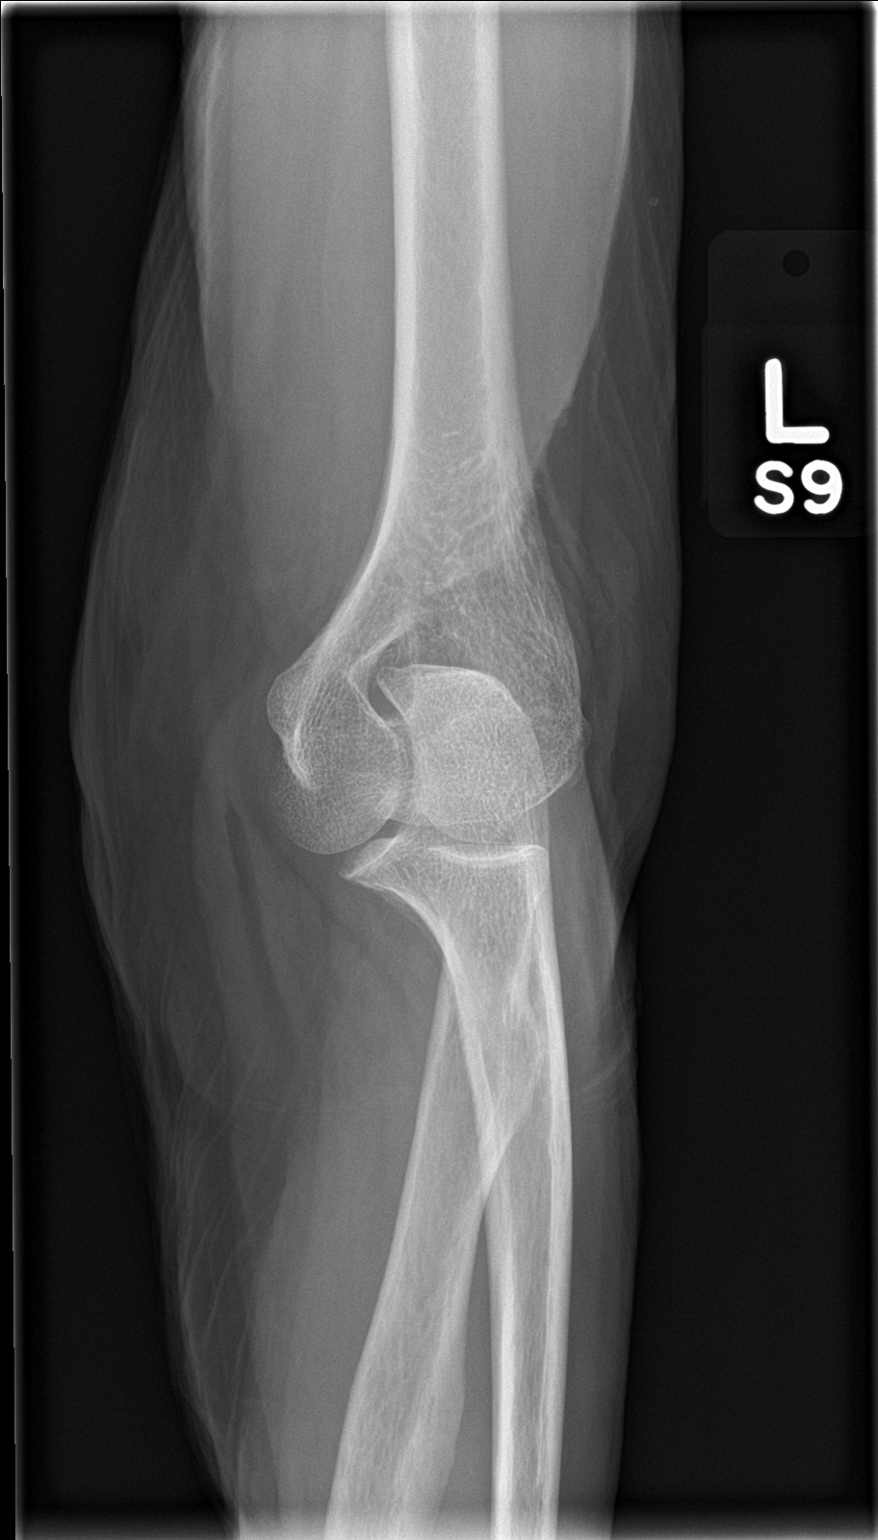

[elbow obl (2 of 2)]
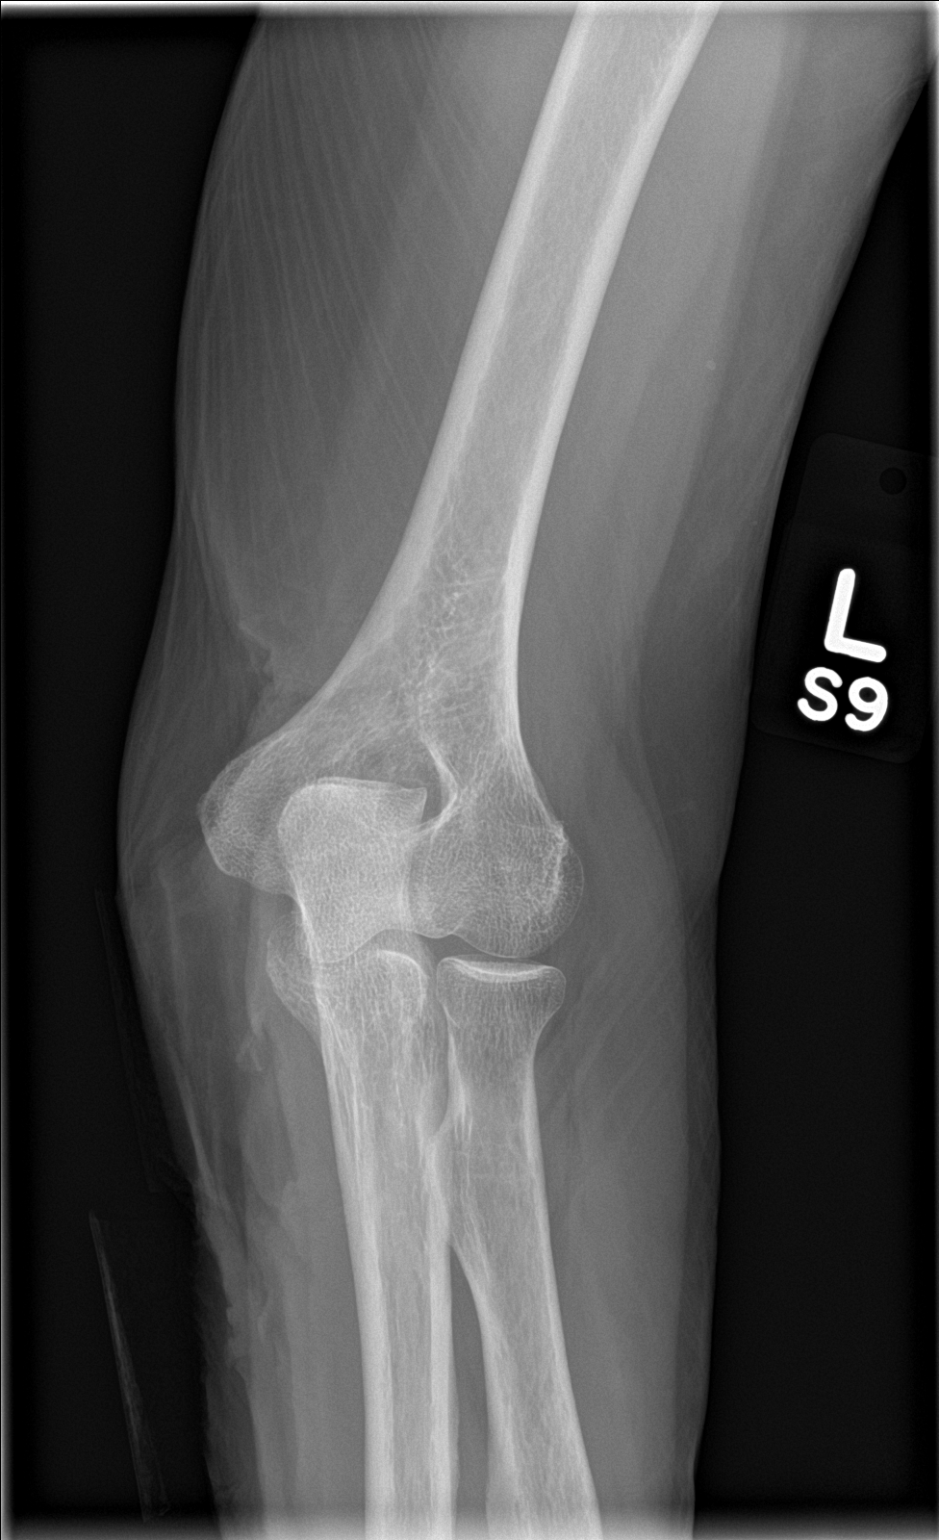

[elbow lat]
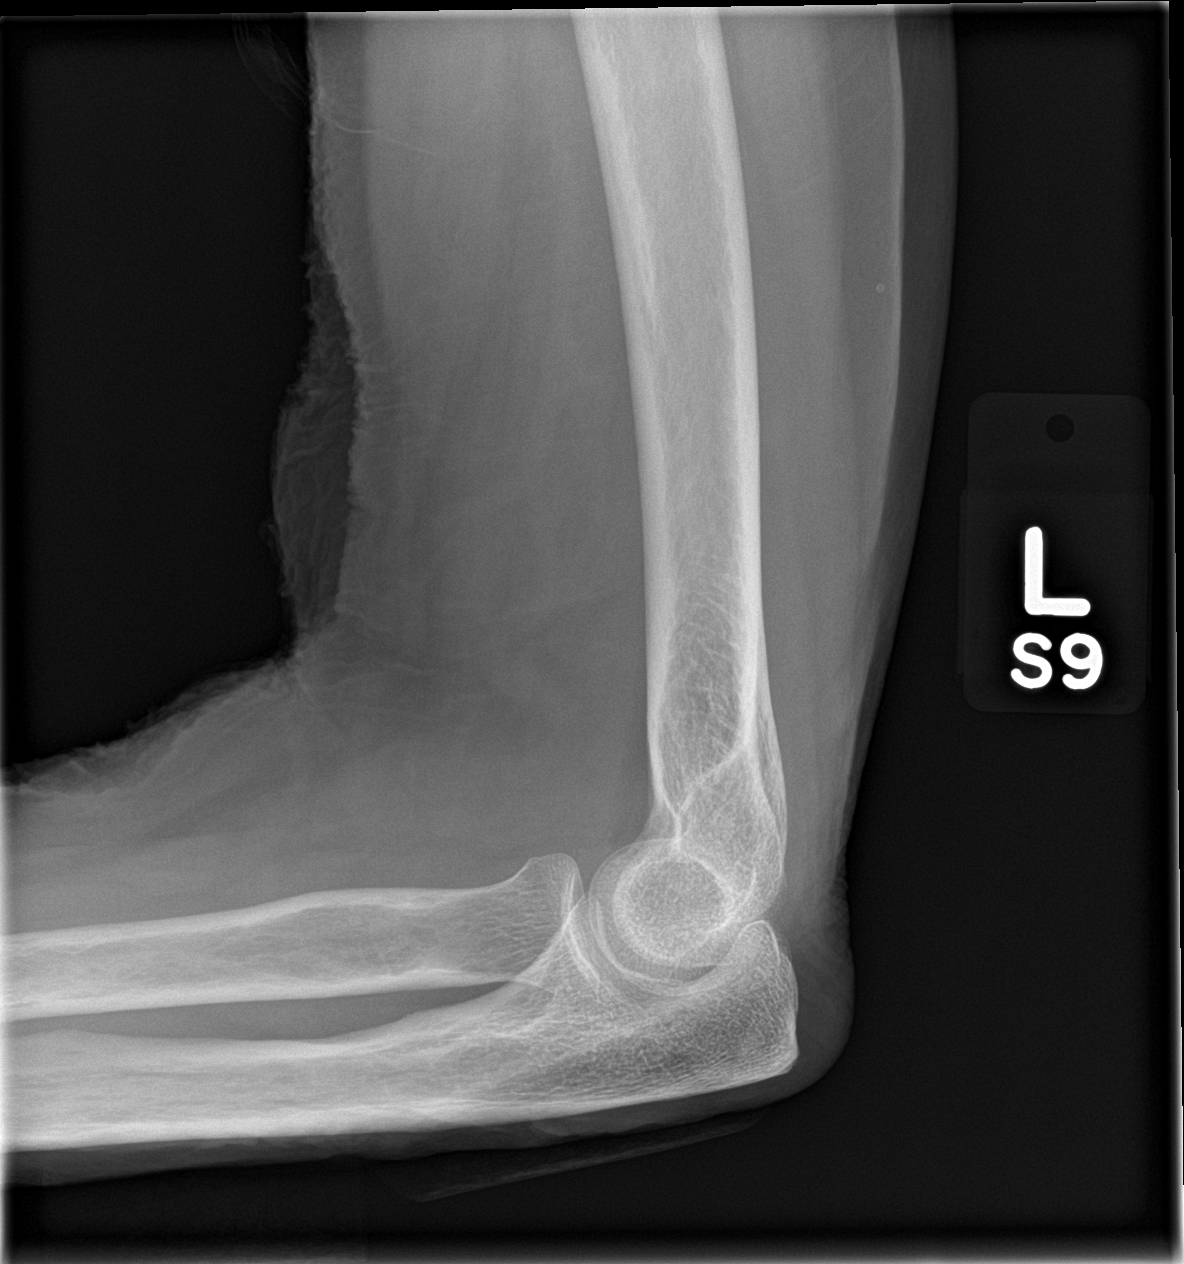

[4 of 4 positions shown; findings below may reference images not displayed]

FINDINGS: There is no evidence of fracture, dislocation, or joint effusion.
There is no evidence of arthropathy or other focal bone abnormality.
Soft tissue laceration is identified overlying the medial
epicondyle.
IMPRESSION: 1. Soft tissue laceration.
2. No acute bone abnormality.
